# Patient Record
Sex: Female | Born: 1940
Health system: Southern US, Community
[De-identification: ages and names within clinical notes are randomized; demographics above are authoritative.]

## PROBLEM LIST (undated history)

## (undated) DIAGNOSIS — K2270 Barrett's esophagus without dysplasia: Secondary | ICD-10-CM

## (undated) DIAGNOSIS — C9240 Acute promyelocytic leukemia, not having achieved remission: Secondary | ICD-10-CM

## (undated) DIAGNOSIS — R9431 Abnormal electrocardiogram [ECG] [EKG]: Secondary | ICD-10-CM

## (undated) DIAGNOSIS — F32A Depression, unspecified: Secondary | ICD-10-CM

## (undated) DIAGNOSIS — M4800 Spinal stenosis, site unspecified: Secondary | ICD-10-CM

## (undated) DIAGNOSIS — K219 Gastro-esophageal reflux disease without esophagitis: Secondary | ICD-10-CM

## (undated) DIAGNOSIS — I4891 Unspecified atrial fibrillation: Secondary | ICD-10-CM

## (undated) DIAGNOSIS — N840 Polyp of corpus uteri: Secondary | ICD-10-CM

## (undated) DIAGNOSIS — C9200 Acute myeloblastic leukemia, not having achieved remission: Secondary | ICD-10-CM

## (undated) DIAGNOSIS — R531 Weakness: Secondary | ICD-10-CM

## (undated) DIAGNOSIS — I10 Essential (primary) hypertension: Secondary | ICD-10-CM

## (undated) DIAGNOSIS — F329 Major depressive disorder, single episode, unspecified: Secondary | ICD-10-CM

## (undated) DIAGNOSIS — F419 Anxiety disorder, unspecified: Secondary | ICD-10-CM

## (undated) DIAGNOSIS — K649 Unspecified hemorrhoids: Secondary | ICD-10-CM

## (undated) DIAGNOSIS — C519 Malignant neoplasm of vulva, unspecified: Secondary | ICD-10-CM

## (undated) HISTORY — PX: PORTACATH PLACEMENT: SHX2246

## (undated) HISTORY — DX: Anxiety disorder, unspecified: F41.9

## (undated) HISTORY — DX: Acute promyelocytic leukemia, not having achieved remission: C92.40

## (undated) HISTORY — PX: CHOLECYSTECTOMY: SHX55

## (undated) HISTORY — DX: Unspecified hemorrhoids: K64.9

## (undated) HISTORY — PX: CATARACT EXTRACTION, BILATERAL: SHX1313

## (undated) HISTORY — DX: Weakness: R53.1

## (undated) HISTORY — DX: Unspecified atrial fibrillation: I48.91

## (undated) HISTORY — PX: TUNNELED VENOUS CATHETER PLACEMENT: SHX818

## (undated) HISTORY — DX: Abnormal electrocardiogram (ECG) (EKG): R94.31

## (undated) HISTORY — PX: KNEE SURGERY: SHX244

## (undated) HISTORY — PX: HEMORROIDECTOMY: SUR656

## (undated) HISTORY — PX: OTHER SURGICAL HISTORY: SHX169

## (undated) HISTORY — DX: Spinal stenosis, site unspecified: M48.00

## (undated) HISTORY — DX: Depression, unspecified: F32.A

## (undated) HISTORY — DX: Polyp of corpus uteri: N84.0

## (undated) HISTORY — DX: Barrett's esophagus without dysplasia: K22.70

## (undated) HISTORY — DX: Malignant neoplasm of vulva, unspecified: C51.9

---
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
## 1998-03-08 ENCOUNTER — Other Ambulatory Visit: Admission: RE | Admit: 1998-03-08 | Discharge: 1998-03-08 | Payer: Self-pay | Admitting: Obstetrics and Gynecology

## 1998-10-24 ENCOUNTER — Encounter: Payer: Self-pay | Admitting: Obstetrics and Gynecology

## 1998-10-24 ENCOUNTER — Ambulatory Visit (HOSPITAL_COMMUNITY): Admission: RE | Admit: 1998-10-24 | Discharge: 1998-10-24 | Payer: Self-pay | Admitting: Obstetrics and Gynecology

## 2000-05-30 ENCOUNTER — Encounter: Admission: RE | Admit: 2000-05-30 | Discharge: 2000-05-30 | Payer: Self-pay | Admitting: Family Medicine

## 2000-05-30 ENCOUNTER — Encounter: Payer: Self-pay | Admitting: Family Medicine

## 2000-07-29 ENCOUNTER — Other Ambulatory Visit: Admission: RE | Admit: 2000-07-29 | Discharge: 2000-07-29 | Payer: Self-pay | Admitting: Obstetrics and Gynecology

## 2000-08-12 ENCOUNTER — Ambulatory Visit (HOSPITAL_COMMUNITY): Admission: RE | Admit: 2000-08-12 | Discharge: 2000-08-12 | Payer: Self-pay | Admitting: Obstetrics and Gynecology

## 2001-06-16 ENCOUNTER — Encounter: Payer: Self-pay | Admitting: Family Medicine

## 2001-06-16 ENCOUNTER — Encounter: Admission: RE | Admit: 2001-06-16 | Discharge: 2001-06-16 | Payer: Self-pay | Admitting: Family Medicine

## 2001-08-03 ENCOUNTER — Other Ambulatory Visit: Admission: RE | Admit: 2001-08-03 | Discharge: 2001-08-03 | Payer: Self-pay | Admitting: Obstetrics and Gynecology

## 2005-12-03 ENCOUNTER — Encounter: Admission: RE | Admit: 2005-12-03 | Discharge: 2005-12-03 | Payer: Self-pay | Admitting: Obstetrics and Gynecology

## 2006-12-16 ENCOUNTER — Encounter: Admission: RE | Admit: 2006-12-16 | Discharge: 2006-12-16 | Payer: Self-pay | Admitting: Obstetrics and Gynecology

## 2007-02-07 ENCOUNTER — Emergency Department (HOSPITAL_COMMUNITY): Admission: EM | Admit: 2007-02-07 | Discharge: 2007-02-07 | Payer: Self-pay | Admitting: Emergency Medicine

## 2007-07-15 ENCOUNTER — Encounter: Admission: RE | Admit: 2007-07-15 | Discharge: 2007-07-15 | Payer: Self-pay | Admitting: Gastroenterology

## 2007-12-24 ENCOUNTER — Encounter: Admission: RE | Admit: 2007-12-24 | Discharge: 2007-12-24 | Payer: Self-pay | Admitting: Obstetrics and Gynecology

## 2008-10-28 DIAGNOSIS — E7800 Pure hypercholesterolemia, unspecified: Secondary | ICD-10-CM | POA: Insufficient documentation

## 2009-01-05 ENCOUNTER — Encounter: Admission: RE | Admit: 2009-01-05 | Discharge: 2009-01-05 | Payer: Self-pay | Admitting: Internal Medicine

## 2010-01-31 ENCOUNTER — Encounter: Admission: RE | Admit: 2010-01-31 | Discharge: 2010-01-31 | Payer: Self-pay | Admitting: Obstetrics and Gynecology

## 2010-11-17 ENCOUNTER — Encounter: Payer: Self-pay | Admitting: Obstetrics and Gynecology

## 2011-01-07 ENCOUNTER — Other Ambulatory Visit: Payer: Self-pay | Admitting: Obstetrics and Gynecology

## 2011-01-07 DIAGNOSIS — Z1231 Encounter for screening mammogram for malignant neoplasm of breast: Secondary | ICD-10-CM

## 2011-02-06 ENCOUNTER — Ambulatory Visit: Payer: Self-pay

## 2012-01-28 ENCOUNTER — Other Ambulatory Visit: Payer: Self-pay | Admitting: Family Medicine

## 2012-01-28 DIAGNOSIS — Z1231 Encounter for screening mammogram for malignant neoplasm of breast: Secondary | ICD-10-CM

## 2012-02-12 ENCOUNTER — Ambulatory Visit
Admission: RE | Admit: 2012-02-12 | Discharge: 2012-02-12 | Disposition: A | Discharge status: Home / Self Care | Payer: Medicare PPO | Source: Ambulatory Visit | Attending: Family Medicine | Admitting: Family Medicine

## 2012-02-12 DIAGNOSIS — Z1231 Encounter for screening mammogram for malignant neoplasm of breast: Secondary | ICD-10-CM

## 2013-01-25 ENCOUNTER — Other Ambulatory Visit: Payer: Self-pay

## 2013-01-25 DIAGNOSIS — Z1231 Encounter for screening mammogram for malignant neoplasm of breast: Secondary | ICD-10-CM

## 2013-02-12 ENCOUNTER — Ambulatory Visit
Admission: RE | Admit: 2013-02-12 | Discharge: 2013-02-12 | Disposition: A | Discharge status: Home / Self Care | Payer: Medicare PPO | Source: Ambulatory Visit

## 2013-02-12 DIAGNOSIS — Z1231 Encounter for screening mammogram for malignant neoplasm of breast: Secondary | ICD-10-CM

## 2014-01-26 ENCOUNTER — Other Ambulatory Visit: Payer: Self-pay

## 2014-01-26 ENCOUNTER — Other Ambulatory Visit: Payer: Self-pay | Admitting: Obstetrics and Gynecology

## 2014-01-26 DIAGNOSIS — Z1231 Encounter for screening mammogram for malignant neoplasm of breast: Secondary | ICD-10-CM

## 2014-01-26 DIAGNOSIS — M8580 Other specified disorders of bone density and structure, unspecified site: Secondary | ICD-10-CM

## 2014-02-16 ENCOUNTER — Ambulatory Visit
Admission: RE | Admit: 2014-02-16 | Discharge: 2014-02-16 | Disposition: A | Discharge status: Home / Self Care | Payer: Medicare PPO | Source: Ambulatory Visit | Attending: Obstetrics and Gynecology | Admitting: Obstetrics and Gynecology

## 2014-02-16 ENCOUNTER — Ambulatory Visit
Admission: RE | Admit: 2014-02-16 | Discharge: 2014-02-16 | Disposition: A | Discharge status: Home / Self Care | Payer: Medicare PPO | Source: Ambulatory Visit

## 2014-02-16 DIAGNOSIS — Z1231 Encounter for screening mammogram for malignant neoplasm of breast: Secondary | ICD-10-CM

## 2014-02-16 DIAGNOSIS — M8580 Other specified disorders of bone density and structure, unspecified site: Secondary | ICD-10-CM

## 2015-08-09 ENCOUNTER — Other Ambulatory Visit: Payer: Self-pay | Admitting: Obstetrics and Gynecology

## 2015-08-10 ENCOUNTER — Encounter (HOSPITAL_COMMUNITY): Payer: Self-pay

## 2015-08-10 ENCOUNTER — Encounter (HOSPITAL_COMMUNITY)
Admission: RE | Admit: 2015-08-10 | Discharge: 2015-08-10 | Disposition: A | Discharge status: Home / Self Care | Payer: Medicare HMO | Source: Ambulatory Visit | Attending: Obstetrics and Gynecology | Admitting: Obstetrics and Gynecology

## 2015-08-10 DIAGNOSIS — Z01818 Encounter for other preprocedural examination: Secondary | ICD-10-CM | POA: Insufficient documentation

## 2015-08-10 HISTORY — DX: Gastro-esophageal reflux disease without esophagitis: K21.9

## 2015-08-10 HISTORY — DX: Essential (primary) hypertension: I10

## 2015-08-10 LAB — CBC WITH DIFFERENTIAL/PLATELET
BASOS ABS: 0.1 10*3/uL (ref 0.0–0.1)
Basophils Relative: 1 %
EOS ABS: 0.5 10*3/uL (ref 0.0–0.7)
Eosinophils Relative: 8 %
HCT: 42.9 % (ref 36.0–46.0)
HEMOGLOBIN: 14.5 g/dL (ref 12.0–15.0)
LYMPHS ABS: 2.2 10*3/uL (ref 0.7–4.0)
LYMPHS PCT: 35 %
MCH: 30.2 pg (ref 26.0–34.0)
MCHC: 33.8 g/dL (ref 30.0–36.0)
MCV: 89.4 fL (ref 78.0–100.0)
Monocytes Absolute: 0.7 10*3/uL (ref 0.1–1.0)
Monocytes Relative: 11 %
NEUTROS PCT: 45 %
Neutro Abs: 2.9 10*3/uL (ref 1.7–7.7)
PLATELETS: 201 10*3/uL (ref 150–400)
RBC: 4.8 MIL/uL (ref 3.87–5.11)
RDW: 15 % (ref 11.5–15.5)
WBC: 6.3 10*3/uL (ref 4.0–10.5)

## 2015-08-10 LAB — COMPREHENSIVE METABOLIC PANEL
ALK PHOS: 91 U/L (ref 38–126)
ALT: 25 U/L (ref 14–54)
AST: 24 U/L (ref 15–41)
Albumin: 4.3 g/dL (ref 3.5–5.0)
Anion gap: 7 (ref 5–15)
BUN: 16 mg/dL (ref 6–20)
CHLORIDE: 107 mmol/L (ref 101–111)
CO2: 26 mmol/L (ref 22–32)
CREATININE: 0.69 mg/dL (ref 0.44–1.00)
Calcium: 9.3 mg/dL (ref 8.9–10.3)
GFR calc non Af Amer: >60 mL/min (ref >60)
GLUCOSE: 101 mg/dL — AB (ref 65–99)
Potassium: 3.7 mmol/L (ref 3.5–5.1)
SODIUM: 140 mmol/L (ref 135–145)
Total Bilirubin: 0.8 mg/dL (ref 0.3–1.2)
Total Protein: 7.2 g/dL (ref 6.5–8.1)

## 2015-08-10 NOTE — Patient Instructions (Addendum)
   Your procedure is scheduled on: OCT 17 AT 730AM  Enter through the Main Entrance of [facility] at: [place] up the phone at the desk and dial [phone] and inform us of your arrival.  Please call this number if you have any problems the morning of surgery: [phone]  DO NOT EAT OR DRINK AFTER MIDNIGHT OCT 16 (SUNDAY)  Take these medicines the morning of surgery with a SIP OF WATER: take prilosec am of surgery  Do not wear jewelry, make-up, No metal in your hair or on your body. Do not wear lotions, powders, perfumes.  You may wear deodorant.  Do not bring valuables to the hospital. Contacts, dentures or bridgework may not be worn into surgery.  .    Patients discharged on the day of surgery will not be allowed to drive home.

## 2015-08-13 ENCOUNTER — Encounter (HOSPITAL_COMMUNITY): Payer: Self-pay | Admitting: Anesthesiology

## 2015-08-13 NOTE — Anesthesia Preprocedure Evaluation (Addendum)
Anesthesia Evaluation  Patient identified by MRN, date of birth, ID band Patient awake    Reviewed: Allergy & Precautions, H&P , NPO status , Patient's Chart, lab work & pertinent test results, reviewed documented beta blocker date and time   Airway Mallampati: I  TM Distance: >3 FB Neck ROM: full    Dental no notable dental hx. (+) Teeth Intact   Pulmonary neg pulmonary ROS,    Pulmonary exam normal        Cardiovascular hypertension, Pt. on medications Normal cardiovascular exam     Neuro/Psych negative neurological ROS  negative psych ROS   GI/Hepatic Neg liver ROS, GERD  Medicated and Controlled,  Endo/Other  negative endocrine ROS  Renal/GU negative Renal ROS     Musculoskeletal   Abdominal Normal abdominal exam  (+)   Peds  Hematology negative hematology ROS (+)   Anesthesia Other Findings   Reproductive/Obstetrics negative OB ROS                            Anesthesia Physical Anesthesia Plan  ASA: II  Anesthesia Plan: General   Post-op Pain Management:    Induction: Intravenous  Airway Management Planned: LMA  Additional Equipment:   Intra-op Plan:   Post-operative Plan:   Informed Consent: I have reviewed the patients History and Physical, chart, labs and discussed the procedure including the risks, benefits and alternatives for the proposed anesthesia with the patient or authorized representative who has indicated his/her understanding and acceptance.     Plan Discussed with: CRNA and Surgeon  Anesthesia Plan Comments:        Anesthesia Quick Evaluation

## 2015-08-13 NOTE — H&P (Signed)
[name], [name] NO.:  [id number]  MEDICAL RECORD NO.:  [id number]  LOCATION:  [facility]                         FACILITY:  [facility]  PHYSICIAN:  Lucille Passy. Ulanda Edison, M.D. DATE OF BIRTH:  May 10, 1941  DATE OF ADMISSION:  08/09/2015 DATE OF DISCHARGE:                             HISTORY & PHYSICAL   HISTORY OF PRESENT ILLNESS:  This is a 74 year old white female admitted to the hospital for D and C, hysteroscopy, removal of endometrial tissue possibly include a polyp in January 2015.  The patient reported some postmenopausal bleeding.  She was seen in the office, and her exam showed that there was a polyp on the cervix, there might have been a little blood in the cervix.  The polyp was removed from the cervix and an endometrial biopsy was done.  Path report showed the cervical polyp was benign endometrial polyp, negative for atypia or malignancy.  The endometrial biopsy showed fragments of an endometrial polyp.  There was negative for atypia or malignancy.  Separate disaggregated fragments of endometrial glands and stroma negative for hyperplasia or malignancy. The patient was informed of the results.  She was told that if she had more bleeding, she should definitely have the D and C.  Even if she did not have more bleeding, she should consider having a D and C and hysteroscopy.  Ultrasound done at that time showed what was thought to be a large polyp, also a tiny area in the right ovary that was advised to be reimaged in several months.  She did have reimaging done in May 2015 and again showed what seemed to be a polyp with the same tiny 4 x 3 mm area in the right ovary.  A followup endometrial biopsy was done in July 2016.  This did not show any true endometrial tissue in terms of intact glands and stroma, and the columnar mucosa might have represented the surface of an atrophic or weakly proliferative endometrium.  There was no atypia noted.  Again, the patient was  advised to consider having an endometrial removal with D and C and hysteroscopy.  An ultrasound done just 2 weeks ago showed thickened endometrium, but it was not clearly a polyp.  The patient had requested to have a D and C, and I wanted to see how the appearance of the endometrium was compared to the last visit.  She is admitted now for a D and C, hysteroscopy at her request to remove whatever the tissue is causing the endometrium to be thickened.  PAST MEDICAL HISTORY:  Reveals high blood pressure, high cholesterol with osteopenia, she also has GERD.  SURGICAL HISTORY:  She has had an endometrial biopsy, colonoscopy, D and C in 1960, and TNA.  ALLERGIES:  Compazine.  No latex allergy.  No other drug allergies.  SOCIAL HISTORY:  Patient never smoked.  FAMILY HISTORY:  Paternal aunt with cancer of the breast.  MEDICATIONS:  Amlodipine, atorvastatin, alprazolam.  PHYSICAL EXAMINATION:  GENERAL:  On admission, well-developed white female, in no distress. VITAL SIGNS:  Blood pressure is 124/80, pulse is 74, height 5 feet 9 inches, weight 189.4. HEAD, EYES, NOSE, AND THROAT:  Reveal a  __________. NECK:  Supple, without thyromegaly. BREASTS:  Soft without masses. LUNGS:  Clear to auscultation. HEART:  Normal size and sounds.  No murmurs. ABDOMEN:  Soft and nontender.  No masses palpable. GU:  Vulva, clitoris is hidden.  Vagina is normal.  Cervix clean, no bleeding.  Uterus normal size.  Adnexa clear.  ADMITTING IMPRESSION:  History of postmenopausal bleeding with probable endometrial polyp on endometrial biopsy and by ultrasound.  The patient is admitted for D and C, hysteroscopy.  She understands there is some risks involved, these have been explained to her and she is ready to proceed.     Lucille Passy. Ulanda Edison, M.D.     TFH/MEDQ  D:  08/13/2015  T:  08/13/2015  Job:  [id number]

## 2015-08-14 ENCOUNTER — Ambulatory Visit (HOSPITAL_COMMUNITY)
Admission: RE | Admit: 2015-08-14 | Discharge: 2015-08-14 | Disposition: A | Discharge status: Home / Self Care | Payer: Medicare HMO | Source: Ambulatory Visit | Attending: Obstetrics and Gynecology | Admitting: Obstetrics and Gynecology

## 2015-08-14 ENCOUNTER — Encounter (HOSPITAL_COMMUNITY): Payer: Self-pay | Admitting: *Deleted

## 2015-08-14 ENCOUNTER — Ambulatory Visit (HOSPITAL_COMMUNITY): Payer: Medicare HMO | Admitting: Anesthesiology

## 2015-08-14 ENCOUNTER — Encounter (HOSPITAL_COMMUNITY)
Admission: RE | Disposition: A | Discharge status: Home / Self Care | Payer: Self-pay | Source: Ambulatory Visit | Attending: Obstetrics and Gynecology

## 2015-08-14 DIAGNOSIS — Z888 Allergy status to other drugs, medicaments and biological substances status: Secondary | ICD-10-CM | POA: Insufficient documentation

## 2015-08-14 DIAGNOSIS — I10 Essential (primary) hypertension: Secondary | ICD-10-CM | POA: Insufficient documentation

## 2015-08-14 DIAGNOSIS — N840 Polyp of corpus uteri: Secondary | ICD-10-CM | POA: Diagnosis not present

## 2015-08-14 DIAGNOSIS — N950 Postmenopausal bleeding: Secondary | ICD-10-CM | POA: Insufficient documentation

## 2015-08-14 DIAGNOSIS — K219 Gastro-esophageal reflux disease without esophagitis: Secondary | ICD-10-CM | POA: Insufficient documentation

## 2015-08-14 HISTORY — PX: HYSTEROSCOPY W/D&C: SHX1775

## 2015-08-14 HISTORY — PX: CERVICAL POLYPECTOMY: SHX88

## 2015-08-14 SURGERY — DILATATION AND CURETTAGE /HYSTEROSCOPY
Anesthesia: General | Site: Vagina

## 2015-08-14 MED ORDER — ONDANSETRON HCL 4 MG/2ML IJ SOLN
INTRAMUSCULAR | Status: DC | PRN
  Administered 2015-08-14: 4 mg via INTRAVENOUS

## 2015-08-14 MED ORDER — ONDANSETRON HCL 4 MG/2ML IJ SOLN
INTRAMUSCULAR | Status: AC
  Filled 2015-08-14: qty 2

## 2015-08-14 MED ORDER — ONDANSETRON HCL 4 MG/2ML IJ SOLN: 4.0000 mg | Freq: Four times a day (QID) | INTRAMUSCULAR | Status: DC | PRN

## 2015-08-14 MED ORDER — LIDOCAINE HCL (CARDIAC) 20 MG/ML IV SOLN
INTRAVENOUS | Status: DC | PRN
  Administered 2015-08-14: 80 mg via INTRAVENOUS

## 2015-08-14 MED ORDER — MIDAZOLAM HCL 2 MG/2ML IJ SOLN
INTRAMUSCULAR | Status: DC | PRN
Start: 2015-08-14 — End: 2015-08-14
  Administered 2015-08-14: 1 mg via INTRAVENOUS

## 2015-08-14 MED ORDER — OXYCODONE HCL 5 MG/5ML PO SOLN: 5.0000 mg | Freq: Once | ORAL | Status: DC | PRN

## 2015-08-14 MED ORDER — PROPOFOL 10 MG/ML IV BOLUS
INTRAVENOUS | Status: DC | PRN
  Administered 2015-08-14: 30 mg via INTRAVENOUS
  Administered 2015-08-14: 150 mg via INTRAVENOUS

## 2015-08-14 MED ORDER — LACTATED RINGERS IV SOLN
INTRAVENOUS | Status: DC
  Administered 2015-08-14 (×2): via INTRAVENOUS

## 2015-08-14 MED ORDER — IBUPROFEN 600 MG PO TABS: 600.0000 mg | ORAL_TABLET | Freq: Four times a day (QID) | ORAL | Status: DC | PRN

## 2015-08-14 MED ORDER — PROPOFOL 10 MG/ML IV BOLUS
INTRAVENOUS | Status: AC
  Filled 2015-08-14: qty 20

## 2015-08-14 MED ORDER — MEPERIDINE HCL 25 MG/ML IJ SOLN: 6.2500 mg | INTRAMUSCULAR | Status: DC | PRN

## 2015-08-14 MED ORDER — ONDANSETRON HCL 4 MG/2ML IJ SOLN: 4.0000 mg | Freq: Once | INTRAMUSCULAR | Status: DC | PRN

## 2015-08-14 MED ORDER — FENTANYL CITRATE (PF) 100 MCG/2ML IJ SOLN: 25.0000 ug | INTRAMUSCULAR | Status: DC | PRN

## 2015-08-14 MED ORDER — ONDANSETRON HCL 4 MG PO TABS: 4.0000 mg | ORAL_TABLET | Freq: Four times a day (QID) | ORAL | Status: DC | PRN

## 2015-08-14 MED ORDER — OXYCODONE HCL 5 MG PO TABS: 5.0000 mg | ORAL_TABLET | Freq: Once | ORAL | Status: DC | PRN

## 2015-08-14 MED ORDER — FENTANYL CITRATE (PF) 100 MCG/2ML IJ SOLN
INTRAMUSCULAR | Status: DC | PRN
  Administered 2015-08-14 (×2): 50 ug via INTRAVENOUS

## 2015-08-14 MED ORDER — LIDOCAINE HCL (CARDIAC) 20 MG/ML IV SOLN
INTRAVENOUS | Status: AC
  Filled 2015-08-14: qty 5

## 2015-08-14 MED ORDER — DEXAMETHASONE SODIUM PHOSPHATE 10 MG/ML IJ SOLN
INTRAMUSCULAR | Status: AC
Start: 2015-08-14 — End: 2015-08-14
  Filled 2015-08-14: qty 1

## 2015-08-14 MED ORDER — FENTANYL CITRATE (PF) 100 MCG/2ML IJ SOLN
INTRAMUSCULAR | Status: AC
  Filled 2015-08-14: qty 4

## 2015-08-14 MED ORDER — MIDAZOLAM HCL 2 MG/2ML IJ SOLN
INTRAMUSCULAR | Status: AC
  Filled 2015-08-14: qty 4

## 2015-08-14 MED ORDER — DEXAMETHASONE SODIUM PHOSPHATE 4 MG/ML IJ SOLN
INTRAMUSCULAR | Status: DC | PRN
  Administered 2015-08-14: 4 mg via INTRAVENOUS

## 2015-08-14 SURGICAL SUPPLY — 14 items
CANISTER SUCT 3000ML (MISCELLANEOUS) ×3 IMPLANT
CATH ROBINSON RED A/P 16FR (CATHETERS) ×3 IMPLANT
CLOTH BEACON ORANGE TIMEOUT ST (SAFETY) ×3 IMPLANT
CONTAINER PREFILL 10% NBF 60ML (FORM) ×6 IMPLANT
ELECT REM PT RETURN 9FT ADLT (ELECTROSURGICAL)
ELECTRODE REM PT RTRN 9FT ADLT (ELECTROSURGICAL) IMPLANT
GLOVE BIO SURGEON STRL SZ7.5 (GLOVE) ×3 IMPLANT
GOWN STRL REUS W/TWL LRG LVL3 (GOWN DISPOSABLE) ×6 IMPLANT
PACK VAGINAL MINOR WOMEN LF (CUSTOM PROCEDURE TRAY) ×3 IMPLANT
PAD OB MATERNITY 4.3X12.25 (PERSONAL CARE ITEMS) ×3 IMPLANT
TOWEL OR 17X24 6PK STRL BLUE (TOWEL DISPOSABLE) ×6 IMPLANT
TUBING AQUILEX INFLOW (TUBING) ×3 IMPLANT
TUBING AQUILEX OUTFLOW (TUBING) ×3 IMPLANT
WATER STERILE IRR 1000ML POUR (IV SOLUTION) ×3 IMPLANT

## 2015-08-14 NOTE — Anesthesia Postprocedure Evaluation (Signed)
Anesthesia Post Note  Patient: [name]  Procedure(s) Performed: Procedure(s) (LRB): DILATATION AND CURETTAGE /HYSTEROSCOPY (N/A) CERVICAL POLYPECTOMY (N/A)  Anesthesia type: General  Patient location: PACU  Post pain: Pain level controlled  Post assessment: Post-op Vital signs reviewed  Last Vitals:  Filed Vitals:   08/14/15 0830  BP: 132/65  Pulse: 66  Temp:   Resp: 11    Post vital signs: Reviewed  Level of consciousness: sedated  Complications: No apparent anesthesia complications

## 2015-08-14 NOTE — Transfer of Care (Signed)
Immediate Anesthesia Transfer of Care Note  Patient: [name]  Procedure(s) Performed: Procedure(s): DILATATION AND CURETTAGE /HYSTEROSCOPY (N/A) CERVICAL POLYPECTOMY (N/A)  Patient Location: PACU  Anesthesia Type:General  Level of Consciousness: awake, alert  and oriented  Airway & Oxygen Therapy: Patient Spontanous Breathing and Patient connected to nasal cannula oxygen  Post-op Assessment: Report given to RN and Post -op Vital signs reviewed and stable  Post vital signs: Reviewed and stable  Last Vitals:  Filed Vitals:   08/14/15 0604  BP: 149/88  Pulse: 70  Temp: 36.4 C  Resp: 18    Complications: No apparent anesthesia complications

## 2015-08-14 NOTE — Discharge Instructions (Signed)
Call with heavy bleeding, temp> 100.4 degrees, severe pain or any problems. DISCHARGE INSTRUCTIONS: D&C / D&E The following instructions have been prepared to help you care for yourself upon your return home.   Personal hygiene:  Use sanitary pads for vaginal drainage, not tampons.  Shower the day after your procedure.  NO tub baths, pools or Jacuzzis for 2-3 weeks.  Wipe front to back after using the bathroom.  Activity and limitations:  Do NOT drive or operate any equipment for 24 hours. The effects of anesthesia are still present and drowsiness may result.  Do NOT rest in bed all day.  Walking is encouraged.  Walk up and down stairs slowly.  You may resume your normal activity in one to two days or as indicated by your physician.  Sexual activity: NO intercourse for at least 2 weeks after the procedure, or as indicated by your physician.  Diet: Eat a light meal as desired this evening. You may resume your usual diet tomorrow.  Return to work: You may resume your work activities in one to two days or as indicated by your doctor.  What to expect after your surgery: Expect to have vaginal bleeding/discharge for 2-3 days and spotting for up to 10 days. It is not unusual to have soreness for up to 1-2 weeks. You may have a slight burning sensation when you urinate for the first day. Mild cramps may continue for a couple of days. You may have a regular period in 2-6 weeks.  Call your doctor for any of the following:  Excessive vaginal bleeding, saturating and changing one pad every hour.  Inability to urinate 6 hours after discharge from hospital.  Pain not relieved by pain medication.  Fever of 100.4 F or greater.  Unusual vaginal discharge or odor.   Call for an appointment:    Patients signature: ______________________  Nurses signature ________________________  Support person's signature_______________________    Post Anesthesia Home Care  Instructions  Activity: Get plenty of rest for the remainder of the day. A responsible adult should stay with you for 24 hours following the procedure.  For the next 24 hours, DO NOT: -Drive a car -Paediatric nurse -Drink alcoholic beverages -Take any medication unless instructed by your physician -Make any legal decisions or sign important papers.  Meals: Start with liquid foods such as gelatin or soup. Progress to regular foods as tolerated. Avoid greasy, spicy, heavy foods. If nausea and/or vomiting occur, drink only clear liquids until the nausea and/or vomiting subsides. Call your physician if vomiting continues.  Special Instructions/Symptoms: Your throat may feel dry or sore from the anesthesia or the breathing tube placed in your throat during surgery. If this causes discomfort, gargle with warm salt water. The discomfort should disappear within 24 hours.  If you had a scopolamine patch placed behind your ear for the management of post- operative nausea and/or vomiting:  1. The medication in the patch is effective for 72 hours, after which it should be removed.  Wrap patch in a tissue and discard in the trash. Wash hands thoroughly with soap and water. 2. You may remove the patch earlier than 72 hours if you experience unpleasant side effects which may include dry mouth, dizziness or visual disturbances. 3. Avoid touching the patch. Wash your hands with soap and water after contact with the patch.

## 2015-08-14 NOTE — Op Note (Signed)
NAME:  NOVAH, NESSEL NO.:  [id number]  MEDICAL RECORD NO.:  [id number]  LOCATION:  [facility]                          FACILITY:  [facility]  PHYSICIAN:  Lucille Passy. Ulanda Edison, M.D. DATE OF BIRTH:  09/14/1941  DATE OF PROCEDURE:  08/14/2015 DATE OF DISCHARGE:                              OPERATIVE REPORT   PREOPERATIVE DIAGNOSIS:  Probable endometrial polyp, postmenopausal bleeding.  POSTOPERATIVE DIAGNOSIS:  Probable endometrial polyp, postmenopausal bleeding.  OPERATION:  Fractional D and C, removal of large endometrial polyp.  OPERATOR:  Lucille Passy. Ulanda Edison, MD  ANESTHESIA:  MAC anesthesia by Charlett Lango, MD and Ms. Fussell.  PROCEDURE IN DETAIL:  The patient was brought to the operating room, placed under general anesthesia with an oral airway placed in the [place] in lithotomy position.  Exam revealed the uterus to be slightly posterior, normal size.  Adnexa were free of masses.  The vulva, vagina, and urethra were prepped with Betadine solution.  Bladder was emptied with a [place] catheter after prepping the urethra.  A Graves speculum was used to visualize the cervix.  The cervix was drawn into the operative field with a tenaculum on the anterior cervix.  Uterus sounded little over 3 inches slight posteriorly.  Cervical canal was dilated gently so that we would easily admit the hysteroscope.  I inserted the hysteroscope and there was an endometrial polyp arising from the posterior aspect of the fundus coming down all the way to the cervix.  I removed the hysteroscope and did an endocervical curettage and then I did a removal of the endometrial polyp.  There were multiple bites required, but finally the last removal was a large 2 cm x 1 cm polyp.  I looked again with the hysteroscope, there was a little bit of polypoid tissue remaining on the right lateral uterine wall.  I removed this, did a D and C of the remaining endometrium, looked again with  the hysteroscope.  The cavity was completely clean.  The procedure was terminated after I used pressure to control the bleeding from the tenaculum site.  Patient seemed to tolerate the procedure well.  Blood loss was less than 10 mL.  Sponge and needle counts correct.  The patient was returned to recovery in satisfactory condition.     Lucille Passy. Ulanda Edison, M.D.     TFH/MEDQ  D:  08/14/2015  T:  08/14/2015  Job:  [id number]

## 2015-08-14 NOTE — Progress Notes (Signed)
Patient ID: [name], female   DOB: 05-08-1941, 74 y.o.   MRN: [id number] I examined this lady within the last week and she reports no change in her health since that time.

## 2015-08-14 NOTE — Anesthesia Procedure Notes (Signed)
Procedure Name: LMA Insertion Date/Time: 08/14/2015 7:33 AM Performed by: Flossie Dibble Pre-anesthesia Checklist: Patient identified, Timeout performed, Emergency Drugs available, Suction available and Patient being monitored Patient Re-evaluated:Patient Re-evaluated prior to inductionOxygen Delivery Method: Circle system utilized Preoxygenation: Pre-oxygenation with 100% oxygen Intubation Type: IV induction LMA: LMA inserted LMA Size: 4.0 Number of attempts: 1 Placement Confirmation: positive ETCO2 and breath sounds checked- equal and bilateral Tube secured with: Tape Dental Injury: Teeth and Oropharynx as per pre-operative assessment

## 2015-08-15 ENCOUNTER — Encounter (HOSPITAL_COMMUNITY): Payer: Self-pay | Admitting: Obstetrics and Gynecology

## 2016-01-08 DIAGNOSIS — G8929 Other chronic pain: Secondary | ICD-10-CM | POA: Insufficient documentation

## 2016-01-23 ENCOUNTER — Ambulatory Visit (INDEPENDENT_AMBULATORY_CARE_PROVIDER_SITE_OTHER): Payer: Medicare HMO | Admitting: Cardiology

## 2016-01-23 ENCOUNTER — Encounter: Payer: Self-pay | Admitting: Cardiology

## 2016-01-23 VITALS — BP 142/80 | HR 77 | Ht 69.0 in | Wt 193.4 lb

## 2016-01-23 DIAGNOSIS — I10 Essential (primary) hypertension: Secondary | ICD-10-CM | POA: Diagnosis not present

## 2016-01-23 DIAGNOSIS — R531 Weakness: Secondary | ICD-10-CM

## 2016-01-23 DIAGNOSIS — R9431 Abnormal electrocardiogram [ECG] [EKG]: Secondary | ICD-10-CM

## 2016-01-23 HISTORY — DX: Abnormal electrocardiogram (ECG) (EKG): R94.31

## 2016-01-23 NOTE — Progress Notes (Signed)
Cardiology Office Note   Date:  01/23/2016   ID:  Aniyjah, Laurance 1941-07-04, MRN [id number]  PCP:  Janie Morning, DO    Chief Complaint  Patient presents with  . Fatigue      History of Present Illness: [name] is a 75 y.o. female who presents for evaluation of weakness, dizziness, near syncope and leg pain.  She has had leg weakness and states that her legs don't want to hold up.  She has jerking all over.  She will then feel like she is going to pass out.  She cannot walk far due to weakness. She was seen by Neurology and was diagnosed with spinal stenosis.  She denies any chest pain or pressure, LE edema or palpitations.  She occasionally has some DOE when cleaning house or when working in the yard.  She says that she has never passed out.  Typically she will suddenly feel like she is going to pass out and she will fall down but not lose consciousness.  She says she is not dizzy, she just gets very weak.  She denies any prodrome of nausea, diaphoresis or palpitations.  She says that she will get very anxious and has taken xanax when this happens which resolves her symptoms.  She can be sitting or standing when these episodes occur.      Past Medical History  Diagnosis Date  . GERD (gastroesophageal reflux disease)   . Hypertension   . Vaginal delivery 1964  . Weakness     Past Surgical History  Procedure Laterality Date  . [place]      89  . Portacath placement    . Hemorroidectomy    . Hysteroscopy w/d&c N/A 08/14/2015    Procedure: DILATATION AND CURETTAGE /HYSTEROSCOPY;  Surgeon: Newton Pigg, MD;  Location: [facility];  Service: Gynecology;  Laterality: N/A;  . Cervical polypectomy N/A 08/14/2015    Procedure: CERVICAL POLYPECTOMY;  Surgeon: Newton Pigg, MD;  Location: [facility];  Service: Gynecology;  Laterality: N/A;     Current Outpatient Prescriptions  Medication Sig Dispense Refill  . ALPRAZolam (XANAX) 0.5 MG tablet Take 0.5 mg by  mouth daily as needed for anxiety.    Marland Kitchen amLODipine (NORVASC) 5 MG tablet Take 5 mg by mouth daily.    Marland Kitchen omeprazole (PRILOSEC) 40 MG capsule Take 40 mg by mouth daily.     No current facility-administered medications for this visit.    Allergies:   Compazine    Social History:  The patient  reports that she has never smoked. She has never used smokeless tobacco. She reports that she does not drink alcohol or use illicit drugs.   Family History:  The patient's family history includes Alzheimer's disease in her brother; CVA in her mother; Heart attack in her brother and mother; Heart disease in her brother and mother; Lymphoma in her father.    ROS:  Please see the history of present illness.   Otherwise, review of systems are positive for none.   All other systems are reviewed and negative.    PHYSICAL EXAM: VS:  BP 142/80 mmHg  Pulse 77  Ht 5\' 9"  (1.753 m)  Wt 193 lb 6.4 oz (87.726 kg)  BMI 28.55 kg/m2 , BMI Body mass index is 28.55 kg/(m^2). GEN: Well nourished, well developed, in no acute distress HEENT: normal Neck: no JVD, carotid bruits, or masses Cardiac:  RRR; no murmurs, rubs, or gallops,no edema  Respiratory:  clear to auscultation bilaterally, normal work of breathing GI: soft, nontender, nondistended, + BS MS: no deformity or atrophy Skin: warm and dry, no rash Neuro:  Strength and sensation are intact Psych: euthymic mood, full affect   EKG:  EKG is ordered today. The ekg ordered today demonstrates NSR at 77bpm with LVH and no ST changes and cannot rule out anterior infarct   Recent Labs: 08/10/2015: ALT 25; BUN 16; Creatinine, Ser 0.69; Hemoglobin 14.5; Platelets 201; Potassium 3.7; Sodium 140    Lipid Panel No results found for: CHOL, TRIG, HDL, CHOLHDL, VLDL, LDLCALC, LDLDIRECT    Wt Readings from Last 3 Encounters:  01/23/16 193 lb 6.4 oz (87.726 kg)  08/10/15 189 lb (85.73 kg)       ASSESSMENT AND PLAN:  1.  Weakness of unclear etiology.  EKG is  nonischemic but ? Old anterior infarct.  There may be an anxiety component to it.  Her CRF include postmenopausal state and HTN.  She has never smoked and no family history of CAD.  I will get a 2D echo to assess LVF and Lexiscan myoview to rule out ischemia.  I will also get a 30 day event monitor to assess for arrhythmias. 2.  HTN - controlled on amlodipine.   3.  Abnormal EKG with ? Old anterior infarct.  Will get echo and nuclear stress test. 4.  Leg weakness - I suspect this is due to spinal stenosis   Current medicines are reviewed at length with the patient today.  The patient does not have concerns regarding medicines.  The following changes have been made:  no change  Labs/ tests ordered today: See above Assessment and Plan No orders of the defined types were placed in this encounter.     Disposition:   FU with me PRN pending results of studies   Signed, Sueanne Margarita, MD  01/23/2016 3:47 PM    [facility] [place], [place], [place]  [address] Phone: [phone]; Fax: [phone]

## 2016-01-23 NOTE — Patient Instructions (Signed)
Medication Instructions:  Your physician recommends that you continue on your current medications as directed. Please refer to the Current Medication list given to you today.   Labwork: None  Testing/Procedures: Your physician has requested that you have an echocardiogram. Echocardiography is a painless test that uses sound waves to create images of your heart. It provides your doctor with information about the size and shape of your heart and how well your heart's chambers and valves are working. This procedure takes approximately one hour. There are no restrictions for this procedure.   Your physician has requested that you have a lexiscan myoview. For further information please visit [email or web address]. Please follow instruction sheet, as given.   Your physician has recommended that you wear an event monitor. Event monitors are medical devices that record the heart's electrical activity. Doctors most often [place] these monitors to diagnose arrhythmias. Arrhythmias are problems with the speed or rhythm of the heartbeat. The monitor is a small, portable device. You can wear one while you do your normal daily activities. This is usually used to diagnose what is causing palpitations/syncope (passing out).  Follow-Up: Your physician recommends that you schedule a follow-up appointment AS NEEDED with Dr. Radford Pax pending study results.  Any Other Special Instructions Will Be Listed Below (If Applicable).     If you need a refill on your cardiac medications before your next appointment, please call your pharmacy.

## 2016-01-24 ENCOUNTER — Other Ambulatory Visit: Payer: Self-pay | Admitting: Cardiology

## 2016-01-24 DIAGNOSIS — R55 Syncope and collapse: Secondary | ICD-10-CM

## 2016-01-24 DIAGNOSIS — R531 Weakness: Secondary | ICD-10-CM

## 2016-01-25 ENCOUNTER — Ambulatory Visit (INDEPENDENT_AMBULATORY_CARE_PROVIDER_SITE_OTHER): Payer: Medicare HMO

## 2016-01-25 DIAGNOSIS — R531 Weakness: Secondary | ICD-10-CM | POA: Diagnosis not present

## 2016-01-25 DIAGNOSIS — R55 Syncope and collapse: Secondary | ICD-10-CM

## 2016-01-26 ENCOUNTER — Telehealth: Payer: Self-pay | Admitting: Cardiology

## 2016-01-26 NOTE — Telephone Encounter (Signed)
LMTCB

## 2016-01-26 NOTE — Telephone Encounter (Signed)
Daughter called back but do not have a DPR to speak with her.  Called pt.  She wants to know if would be OK to continue therapy on her legs, exercising, while wearing the 30 day monitor.  Advised the exercises should not interfere with the monitor.  Advised for her to call the therapist to see if they felt like there would be any interference.  She verbalizes understanding.

## 2016-01-26 NOTE — Telephone Encounter (Signed)
NEw Message  Pt calling to speak w/ RN- wanted to know if pt can do therapy for stenosis of her back. Please call back and discuss.

## 2016-02-06 ENCOUNTER — Telehealth (HOSPITAL_COMMUNITY): Payer: Self-pay | Admitting: *Deleted

## 2016-02-06 NOTE — Telephone Encounter (Signed)
Left message on voicemail in reference to upcoming appointment scheduled for 02/08/16. Phone number given for a call back so details instructions can be given. Keldrick Pomplun, Ranae Palms

## 2016-02-06 NOTE — Telephone Encounter (Signed)
Patient given detailed instructions per Myocardial Perfusion Study Information Sheet for the test on 02/08/16 at 9:45. Patient notified to arrive 15 minutes early and that it is imperative to arrive on time for appointment to keep from having the test rescheduled.  If you need to cancel or reschedule your appointment, please call the office within 24 hours of your appointment. Failure to do so may result in a cancellation of your appointment, and a $50 no show fee. Patient verbalized understanding.[name]

## 2016-02-08 ENCOUNTER — Other Ambulatory Visit: Payer: Self-pay

## 2016-02-08 ENCOUNTER — Ambulatory Visit (HOSPITAL_BASED_OUTPATIENT_CLINIC_OR_DEPARTMENT_OTHER): Payer: Medicare HMO

## 2016-02-08 ENCOUNTER — Ambulatory Visit (HOSPITAL_COMMUNITY): Payer: Medicare HMO | Attending: Cardiovascular Disease

## 2016-02-08 DIAGNOSIS — R531 Weakness: Secondary | ICD-10-CM | POA: Insufficient documentation

## 2016-02-08 DIAGNOSIS — Z8249 Family history of ischemic heart disease and other diseases of the circulatory system: Secondary | ICD-10-CM | POA: Diagnosis not present

## 2016-02-08 DIAGNOSIS — R9431 Abnormal electrocardiogram [ECG] [EKG]: Secondary | ICD-10-CM

## 2016-02-08 DIAGNOSIS — R0609 Other forms of dyspnea: Secondary | ICD-10-CM | POA: Diagnosis not present

## 2016-02-08 DIAGNOSIS — C9590 Leukemia, unspecified not having achieved remission: Secondary | ICD-10-CM | POA: Insufficient documentation

## 2016-02-08 DIAGNOSIS — R42 Dizziness and giddiness: Secondary | ICD-10-CM | POA: Insufficient documentation

## 2016-02-08 DIAGNOSIS — I10 Essential (primary) hypertension: Secondary | ICD-10-CM | POA: Insufficient documentation

## 2016-02-08 LAB — MYOCARDIAL PERFUSION IMAGING
CHL CUP NUCLEAR SRS: 8
CHL CUP NUCLEAR SSS: 7
LHR: 0.38
LV sys vol: 26 mL
LVDIAVOL: 68 mL (ref 46–106)
NUC STRESS TID: 1.07
Peak HR: 101 {beats}/min
Rest HR: 65 {beats}/min
SDS: 2

## 2016-02-08 MED ORDER — TECHNETIUM TC 99M SESTAMIBI GENERIC - CARDIOLITE
10.7000 | Freq: Once | INTRAVENOUS | Status: AC | PRN
  Administered 2016-02-08: 11 via INTRAVENOUS

## 2016-02-08 MED ORDER — REGADENOSON 0.4 MG/5ML IV SOLN
0.4000 mg | Freq: Once | INTRAVENOUS | Status: AC
  Administered 2016-02-08: 0.4 mg via INTRAVENOUS

## 2016-02-08 MED ORDER — TECHNETIUM TC 99M SESTAMIBI GENERIC - CARDIOLITE
32.6000 | Freq: Once | INTRAVENOUS | Status: AC | PRN
  Administered 2016-02-08: 33 via INTRAVENOUS

## 2016-03-05 ENCOUNTER — Telehealth: Payer: Self-pay

## 2016-03-05 MED ORDER — METOPROLOL SUCCINATE ER 25 MG PO TB24: 25.0000 mg | ORAL_TABLET | Freq: Every day | ORAL | Status: DC

## 2016-03-05 NOTE — Telephone Encounter (Signed)
-----   Message from Sueanne Margarita, MD sent at 03/03/2016  6:22 PM EDT ----- Heart monitor showed NSR with occasional PACs and nonsustained atrial tachycardia up to 13 beats. Please add Toprol XL 25mg  daily and followup with APP in 3 weeks to see how she is doing - check TSH and BMET

## 2016-03-05 NOTE — Telephone Encounter (Signed)
Informed patient of results and verbal understanding expressed.  Instructed patient to START TOPROL 25 mg daily. Follow-up OV scheduled with Jentry Kristoff 5/31 at 0830. Patient lives in [place]. Will send Rx for TSH and BMET to be drawn at PCP. Patient agrees with treatment plan.

## 2016-03-21 ENCOUNTER — Encounter: Payer: Self-pay | Admitting: Cardiology

## 2016-03-26 NOTE — Progress Notes (Signed)
Cardiology Office Note   Date:  03/27/2016   ID:  [name], [name] 1941-09-30, MRN [id number]  PCP:  Janie Morning, DO  Cardiologist:  Dr. Radford Pax    Chief Complaint  Patient presents with  . Tachycardia    follow up on toprol      History of Present Illness: [name] is a 75 y.o. female who presents for follow up of monitor, Echo and nuc study.   She has been evaluated for weakness, dizziness, near syncope and leg pain. Seen by Dr. Radford Pax 01/23/16.  She has had leg weakness and states that her legs don't want to hold up. She has jerking all over. She will then feel like she is going to pass out. She cannot walk far due to weakness. She was seen by Neurology and was diagnosed with spinal stenosis. She denies any chest pain or pressure, LE edema or palpitations. She occasionally has some DOE when cleaning house or when working in the yard. She says that she has never passed out. Typically she will suddenly feel like she is going to pass out and she will fall down but not lose consciousness. She says she is not dizzy, she just gets very weak. She denies any prodrome of nausea, diaphoresis or palpitations. She says that she will get very anxious and has taken xanax when this happens which resolves her symptoms. She can be sitting or standing when these episodes occur.   Nuc study reveled no ischemia and echo was normal except mildly stiff ventricle. Her monitor revealed SR with PACs and nonsustained atrial tachy t 13 beats  She was placed on torol 25 mg daily.  Today is doing well, feels better.  No rapid HR.  We reviewed results of her tests.  insturcted no to very little caffeine and no pseudoephedrine.    Past Medical History  Diagnosis Date  . GERD (gastroesophageal reflux disease)   . Hypertension   . Vaginal delivery 1964  . Weakness   . Abnormal EKG 01/23/2016    Past Surgical History  Procedure Laterality Date  . [place]      91  . Portacath placement     . Hemorroidectomy    . Hysteroscopy w/d&c N/A 08/14/2015    Procedure: DILATATION AND CURETTAGE /HYSTEROSCOPY;  Surgeon: Newton Pigg, MD;  Location: [facility];  Service: Gynecology;  Laterality: N/A;  . Cervical polypectomy N/A 08/14/2015    Procedure: CERVICAL POLYPECTOMY;  Surgeon: Newton Pigg, MD;  Location: [facility];  Service: Gynecology;  Laterality: N/A;     Current Outpatient Prescriptions  Medication Sig Dispense Refill  . amLODipine (NORVASC) 5 MG tablet Take 5 mg by mouth daily.    . diazepam (VALIUM) 10 MG tablet Take 10 mg by mouth 2 (two) times daily.    . metoprolol succinate (TOPROL-XL) 25 MG 24 hr tablet Take 1 tablet (25 mg total) by mouth daily. 30 tablet 11   No current facility-administered medications for this visit.    Allergies:   Compazine and Prochlorperazine    Social History:  The patient  reports that she has never smoked. She has never used smokeless tobacco. She reports that she does not drink alcohol or use illicit drugs.   Family History:  The patient's family history includes Alzheimer's disease in her brother; CVA in her mother; Heart attack in her brother and mother; Heart disease in her brother and mother; Lymphoma in her father.    ROS:  General:no colds or fevers,  no weight changes CV:see HPI PUL:see HPI MS:no joint pain, no claudication, + leg weakness due to spinal stenosis, just completed PT. Neuro:no syncope, no lightheadedness  Wt Readings from Last 3 Encounters:  03/27/16 192 lb 12.8 oz (87.454 kg)  02/08/16 193 lb (87.544 kg)  01/23/16 193 lb 6.4 oz (87.726 kg)     PHYSICAL EXAM: VS:  BP 120/80 mmHg  Pulse 73  Ht 5\' 9"  (1.753 m)  Wt 192 lb 12.8 oz (87.454 kg)  BMI 28.46 kg/m2  SpO2 91% , BMI Body mass index is 28.46 kg/(m^2). General:Pleasant affect, NAD Skin:Warm and dry, brisk capillary refill HEENT:normocephalic, sclera clear, mucus membranes moist Heart:S1S2 RRR without murmur, gallup, rub or click Lungs:clear without  rales, rhonchi, or wheezes Neuro:alert and oriented, MAE, follows commands, + facial symmetry    EKG:  EKG is not ordered today.    Recent Labs: 08/10/2015: ALT 25; BUN 16; Creatinine, Ser 0.69; Hemoglobin 14.5; Platelets 201; Potassium 3.7; Sodium 140    Lipid Panel No results found for: CHOL, TRIG, HDL, CHOLHDL, VLDL, LDLCALC, LDLDIRECT     Other studies Reviewed: Additional studies/ records that were reviewed today include:  Heart Monitor : Heart monitor showed NSR with occasional PACs and nonsustained atrial tachycardia up to 13 beats    ECHO 02/08/16 Study Conclusions  - Left ventricle: The cavity size was normal. Wall thickness was  normal. Systolic function was normal. The estimated ejection  fraction was in the range of 50% to 55%. Doppler parameters are  consistent with abnormal left ventricular relaxation (grade 1  diastolic dysfunction). Indeterminate ventricular filling  pressure. - Aortic valve: Mildly thickened leaflets.  Nuclear stress test: 02/08/16 Study Highlights     Nuclear stress EF: 62%.  There was no ST segment deviation noted during stress.  The study is normal. no ecvidence of ischemia.  This is a low risk study     ASSESSMENT AND PLAN:  1.  Atrial tach on monitor we added toprol and pt feels better.  Continue the toprol and follow up with Dr. Radford Pax in 6 months though if problems she may call to be seen.   2. Weakness of unclear etiology.this has improved.  EKG is nonischemic but ? Old anterior infarct. There may be an anxiety component to it. Her CRF include postmenopausal state and HTN. She has never smoked and no family history of CAD.her Nuc study was negative for ischemia and her Echo had mild LV  Stiffness.   3.. HTN - controlled on amlodipine and now toprol.   3. Abnormal EKG with ? Old anterior infarct. see above  4. Leg weakness -  due to spinal stenosis, she has completed PT and is doing better.     Current medicines are reviewed with the patient today.  The patient Has no concerns regarding medicines.  The following changes have been made:  See above Labs/ tests ordered today include:see above  Disposition:   FU:  see above  Signed, Cecilie Kicks, NP  03/27/2016 8:45 AM    [address], [place], [place], [place] Phone: [phone]; Fax: [phone]

## 2016-03-27 ENCOUNTER — Telehealth: Payer: Self-pay | Admitting: Cardiology

## 2016-03-27 ENCOUNTER — Ambulatory Visit (INDEPENDENT_AMBULATORY_CARE_PROVIDER_SITE_OTHER): Payer: Medicare HMO | Admitting: Cardiology

## 2016-03-27 ENCOUNTER — Encounter: Payer: Self-pay | Admitting: Cardiology

## 2016-03-27 VITALS — BP 120/80 | HR 73 | Ht 69.0 in | Wt 192.8 lb

## 2016-03-27 DIAGNOSIS — R9431 Abnormal electrocardiogram [ECG] [EKG]: Secondary | ICD-10-CM | POA: Diagnosis not present

## 2016-03-27 DIAGNOSIS — I471 Supraventricular tachycardia: Secondary | ICD-10-CM | POA: Diagnosis not present

## 2016-03-27 DIAGNOSIS — I10 Essential (primary) hypertension: Secondary | ICD-10-CM | POA: Diagnosis not present

## 2016-03-27 DIAGNOSIS — M4800 Spinal stenosis, site unspecified: Secondary | ICD-10-CM | POA: Diagnosis not present

## 2016-03-27 MED ORDER — METOPROLOL SUCCINATE ER 25 MG PO TB24: 25.0000 mg | ORAL_TABLET | Freq: Every day | ORAL | Status: DC

## 2016-03-27 NOTE — Telephone Encounter (Signed)
Patient st she had her lab work drawn at her PCP's office.  Labs are scanned in. Informed patient that labs look good, but will be routed to Dr. Radford Pax for review. She understands she will be called if Dr. Radford Pax has further recommendations.

## 2016-03-27 NOTE — Patient Instructions (Signed)
Medication Instructions:  1. A REFILL FOR TOPROL HAS BEEN SENT IN  Labwork: NONE  Testing/Procedures: NONE  Follow-Up: Your physician wants you to follow-up in: 6 MONTHS WITH DR. Mallie Snooks will receive a reminder letter in the mail two months in advance. If you don't receive a letter, please call our office to schedule the follow-up appointment.   Any Other Special Instructions Will Be Listed Below (If Applicable).     If you need a refill on your cardiac medications before your next appointment, please call your pharmacy.

## 2016-03-27 NOTE — Telephone Encounter (Signed)
[name] is calling to get her lab results . Please call .Marland Kitchen Thanks

## 2016-03-28 ENCOUNTER — Telehealth: Payer: Self-pay | Admitting: Cardiology

## 2016-03-28 NOTE — Telephone Encounter (Signed)
Informed patient there is no documentation of anyone calling her. Apologized for inconvenience and informed patient that whoever the caller was would call back if needed. She was grateful for call.

## 2016-03-28 NOTE — Telephone Encounter (Signed)
New message    Patient verbalized someone called her just now .When looking at the call ID - [facility] office # came up.     Inform the patient of 5.31.2017 messages from Dr. Radford Pax nurse .

## 2016-04-22 ENCOUNTER — Other Ambulatory Visit: Payer: Self-pay | Admitting: Obstetrics and Gynecology

## 2016-04-22 DIAGNOSIS — Z1231 Encounter for screening mammogram for malignant neoplasm of breast: Secondary | ICD-10-CM

## 2016-05-06 ENCOUNTER — Ambulatory Visit
Admission: RE | Admit: 2016-05-06 | Discharge: 2016-05-06 | Disposition: A | Discharge status: Home / Self Care | Payer: Medicare HMO | Source: Ambulatory Visit | Attending: Obstetrics and Gynecology | Admitting: Obstetrics and Gynecology

## 2016-05-06 ENCOUNTER — Other Ambulatory Visit: Payer: Self-pay | Admitting: Obstetrics and Gynecology

## 2016-05-06 DIAGNOSIS — Z1231 Encounter for screening mammogram for malignant neoplasm of breast: Secondary | ICD-10-CM

## 2016-06-11 DIAGNOSIS — H2510 Age-related nuclear cataract, unspecified eye: Secondary | ICD-10-CM | POA: Insufficient documentation

## 2017-02-24 ENCOUNTER — Encounter: Payer: Self-pay | Admitting: Neurology

## 2017-03-14 NOTE — Progress Notes (Signed)
[name] was seen today in the movement disorders clinic for neurologic consultation at the request of Dr. Vertell Limber.  Pts PCP is Janie Morning, DO.  The consultation is for the evaluation of R hand tremor.  Pt has seen other neurologists including Dr. Baltazar Najjar and Dr. Metta Clines.  The records that were made available to me were reviewed.  She last saw Dr. Baltazar Najjar in June, 2017.  She was being assessed for weakness and is worked up for myasthenia gravis.  Acetylcholine receptor antibodies were apparently negative.  She had a tremor at that time that was attributed to anxiety.   Tremor: Yes.    How long has it been going on? 2 years ("it worries me and I want to cry") At rest or with activation?  With activation/with writing/eating Fam hx of tremor?  No. Located where?  R hand more than L but she is R hand dominant Affected by caffeine:  Unknown (doesn't drink) Affected by alcohol:  Unknown (doesn't drink) Affected by stress:  Yes.   ("but I will take valium and that will calm it") Affected by fatigue:  No. Spills soup if on spoon:  No. Spills glass of liquid if full:  No. Affects ADL's (tying shoes, brushing teeth, etc):  No.   Specific Symptoms:  Voice: no change, except deeper voice Sleep: may or may not sleep well  Vivid Dreams:  No., but dreams every night  Acting out dreams:  No. Postural symptoms:  Yes.    Falls?  Yes.  , because "legs are weak", perhaps from spinal stenosis Bradykinesia symptoms: difficulty getting out of a chair and difficulty regaining balance Loss of smell:  No. Loss of taste:  No. Urinary Incontinence:  No. Difficulty Swallowing:  No. Handwriting, micrographia: Yes.   Depression:  Yes.   and especially anxious (chronic) Memory changes:  No. Hallucinations:  No.  visual distortions: No. N/V:  No. Lightheaded:  minimally  Syncope: No. Diplopia:  No. Dyskinesia:  No.  Neuroimaging has not previously been performed.     ALLERGIES:   Allergies    Allergen Reactions  . Compazine [Prochlorperazine Edisylate] Hives  . Eliquis [Apixaban] Itching and Nausea Only  . Prednisone Nausea Only  . Prochlorperazine Other (See Comments)    unknown    CURRENT MEDICATIONS:  Outpatient Encounter Prescriptions as of 03/18/2017  Medication Sig  . amLODipine (NORVASC) 5 MG tablet Take 5 mg by mouth daily.  . ASHWAGANDHA PO Take by mouth.  Marland Kitchen aspirin EC 81 MG tablet Take 81 mg by mouth daily.  . diazepam (VALIUM) 10 MG tablet Take 10 mg by mouth 2 (two) times daily.  Marland Kitchen MAGNESIUM PO Take by mouth at bedtime as needed.  . pantoprazole (PROTONIX) 40 MG tablet Take 40 mg by mouth daily.  . ranitidine (ZANTAC) 300 MG capsule Take 300 mg by mouth every evening.  . vitamin B-12 (CYANOCOBALAMIN) 1000 MCG tablet Take 1,000 mcg by mouth daily.  . [DISCONTINUED] metoprolol succinate (TOPROL-XL) 25 MG 24 hr tablet Take 1 tablet (25 mg total) by mouth daily.   No facility-administered encounter medications on file as of 03/18/2017.     PAST MEDICAL HISTORY:   Past Medical History:  Diagnosis Date  . Abnormal EKG 01/23/2016  . Anxiety   . GERD (gastroesophageal reflux disease)   . Hypertension   . Vaginal delivery 1964  . Weakness     PAST SURGICAL HISTORY:   Past Surgical History:  Procedure Laterality Date  . CATARACT  EXTRACTION, BILATERAL     august/september 2017  . CERVICAL POLYPECTOMY N/A 08/14/2015   Procedure: CERVICAL POLYPECTOMY;  Surgeon: Newton Pigg, MD;  Location: [facility];  Service: Gynecology;  Laterality: N/A;  . CHOLECYSTECTOMY    . HEMORROIDECTOMY    . HYSTEROSCOPY W/D&C N/A 08/14/2015   Procedure: DILATATION AND CURETTAGE /HYSTEROSCOPY;  Surgeon: Newton Pigg, MD;  Location: [facility];  Service: Gynecology;  Laterality: N/A;  . PORTACATH PLACEMENT      SOCIAL HISTORY:   Social History   Social History  . Marital status: Married    Spouse name: N/A  . Number of children: N/A  . Years of education: N/A   Occupational  History  . Not on file.   Social History Main Topics  . Smoking status: Never Smoker  . Smokeless tobacco: Never Used  . Alcohol use No  . Drug use: No  . Sexual activity: Not on file   Other Topics Concern  . Not on file   Social History Narrative  . No narrative on file    FAMILY HISTORY:   Family Status  Relation Status  . Mother Deceased  . Father Deceased  . Brother Deceased  . Child Alive    ROS:  Very emotional when discussing death of her brother in 02/08/2010 and states still is not over it.  A complete 10 system review of systems was obtained and was unremarkable apart from what is mentioned above.  PHYSICAL EXAMINATION:    VITALS:   Vitals:   03/18/17 0901  BP: 120/70  Pulse: 88  SpO2: 96%  Weight: 190 lb (86.2 kg)  Height: 5\' 9"  (1.753 m)    GEN:  The patient appears stated age and is in NAD.  Tearful when discussing her brother.   HEENT:  Normocephalic, atraumatic.  The mucous membranes are moist. The superficial temporal arteries are without ropiness or tenderness. CV:  RRR Lungs:  CTAB Neck/HEME:  There are no carotid bruits bilaterally.  Neurological examination:  Orientation: The patient is alert and oriented x3. Fund of knowledge is appropriate.  Recent and remote memory are intact.  Attention and concentration are normal.    Able to name objects and repeat phrases. Cranial nerves: There is good facial symmetry. Pupils are equal round and reactive to light bilaterally. Fundoscopic exam reveals clear margins bilaterally. Extraocular muscles are intact. The visual fields are full to confrontational testing. The speech is fluent and clear. Soft palate rises symmetrically and there is no tongue deviation. Hearing is intact to conversational tone. Sensation: Sensation is intact to light and pinprick throughout (facial, trunk, extremities). Vibration is intact at the bilateral big toe. There is no extinction with double simultaneous stimulation. There is no  sensory dermatomal level identified. Motor: Strength is 5/5 in the bilateral upper and lower extremities.   Shoulder shrug is equal and symmetric.  There is no pronator drift. Deep tendon reflexes: Deep tendon reflexes are 2/4 at the bilateral biceps, triceps, brachioradialis, patella and 1/4 at the bilateral achilles. Plantar responses are downgoing bilaterally.  Movement examination: Tone: There is normal tone in the bilateral upper extremities.  The tone in the lower extremities is normal.  Abnormal movements: No rest tremor.  No postural tremor until given a weight and then mild tremor on the right.  Mild tremor noted with archimedes spirals on the right.  Some tremor noted on the R when asked to pour water from one glass to another when the full glass is in the R  hand Coordination:  There is no decremation with RAM's, with any form of RAMS, including alternating supination and pronation of the forearm, hand opening and closing, finger taps, heel taps and toe taps.   Gait and Station: The patient has no difficulty arising out of a deep-seated chair without the use of the hands. The patient's stride length is normal.    ASSESSMENT/PLAN:  1.  Essential Tremor.  -This is asymmetric essential tremor.  This can happen 30% of the time.   We discussed nature and pathophysiology.  We discussed that this can continue to gradually get worse with time.  We discussed that some medications can worsen this, as can caffeine use.  We discussed medication therapy as well as surgical therapy.  Ultimately, the patient decided to try primidone.  Risks, benefits, side effects and alternative therapies were discussed.  The opportunity to ask questions was given and they were answered to the best of my ability.  The patient expressed understanding and willingness to follow the outlined treatment protocols.    2.  Generalized Anxiety d/o  -I talked to her about addressing this with her PCP.  Not sure that valium is the  right long term medication and while she does like the valium, she also seems to want help with her anxiety d/o  3.  Follow up is anticipated in the next few months, sooner should new neurologic issues arise.  4.  Much greater than 50% of this visit was spent in counseling and coordinating care.  Total face to face time:  60 min   Cc:  Janie Morning, DO

## 2017-03-18 ENCOUNTER — Encounter: Payer: Self-pay | Admitting: Neurology

## 2017-03-18 ENCOUNTER — Ambulatory Visit (INDEPENDENT_AMBULATORY_CARE_PROVIDER_SITE_OTHER): Payer: Medicare HMO | Admitting: Neurology

## 2017-03-18 VITALS — BP 120/70 | HR 88 | Ht 69.0 in | Wt 190.0 lb

## 2017-03-18 DIAGNOSIS — F411 Generalized anxiety disorder: Secondary | ICD-10-CM

## 2017-03-18 DIAGNOSIS — G250 Essential tremor: Secondary | ICD-10-CM

## 2017-03-18 MED ORDER — PRIMIDONE 50 MG PO TABS: 50.0000 mg | ORAL_TABLET | Freq: Every day | ORAL | 3 refills | Status: DC

## 2017-03-18 NOTE — Patient Instructions (Signed)
Start Primidone 50 mg tablets. Take 1/2 tablet for 4 nights, then increase to 1 tablet. Prescription has been sent to your pharmacy. The first dose of medication can cause some dizziness/nausea that should go away after the first dose.   

## 2017-03-25 ENCOUNTER — Telehealth: Payer: Self-pay | Admitting: Neurology

## 2017-03-25 NOTE — Telephone Encounter (Signed)
P[atient wants to talk to someone about the side effects of the medication Dr Tat put her on

## 2017-03-25 NOTE — Telephone Encounter (Signed)
Patient made aware.

## 2017-03-25 NOTE — Telephone Encounter (Signed)
Spoke with patient and she states she read the side effects of Primidone.  It states that can lower red blood cells. She has a history of Leukemia and this makes her nervous.  She did take 1/2 tablet once and it helped tremor. She asked if she could take 1/2 tablet every other day. I made her aware she probably wouldn't get much benefit from that.  She does get blood work every 6 months so if there was an issue it would be detected in that. I did talk to her about rare side effects and how these still have to be reported. I told her it is ultimately what she is comfortable taking. Let her know there would be possible side effects with any medication.  Dr. Carles Collet please advise if there is an alternative.

## 2017-03-25 NOTE — Telephone Encounter (Signed)
That side effect has to do with patients taking it for seizure in dosages typical for seizure patients (250 mg tid at least).  I've personally never had this in patients with tremor at 50 q day or even 50 tid for that matter.

## 2017-04-07 ENCOUNTER — Telehealth: Payer: Self-pay | Admitting: Neurology

## 2017-04-07 NOTE — Telephone Encounter (Signed)
Caller: Mickel Baas  Urgent? No  Reason for the call: She was seen today by Dr. Vertell Limber. He wanted her to start back on her Primidone medication. She had stopped it due to being sleepy. She also had some questions regarding if it would  decreased her red blood cells? She also wanted to know could the dosage be decreased? Thanks

## 2017-04-08 NOTE — Telephone Encounter (Signed)
Discussed again with patient about high doses of Primidone (250 mg +) causing problems with RBC's. She will restart Primidone at 50 mg 1/2 tablet for 4 days then increase to one tablet daily.

## 2017-05-01 ENCOUNTER — Other Ambulatory Visit: Payer: Self-pay | Admitting: Obstetrics and Gynecology

## 2017-05-01 DIAGNOSIS — Z1231 Encounter for screening mammogram for malignant neoplasm of breast: Secondary | ICD-10-CM

## 2017-05-12 ENCOUNTER — Ambulatory Visit
Admission: RE | Admit: 2017-05-12 | Discharge: 2017-05-12 | Disposition: A | Discharge status: Home / Self Care | Payer: Medicare HMO | Source: Ambulatory Visit | Attending: Obstetrics and Gynecology | Admitting: Obstetrics and Gynecology

## 2017-05-12 DIAGNOSIS — Z1231 Encounter for screening mammogram for malignant neoplasm of breast: Secondary | ICD-10-CM

## 2017-05-16 DIAGNOSIS — I10 Essential (primary) hypertension: Secondary | ICD-10-CM | POA: Diagnosis not present

## 2017-05-16 DIAGNOSIS — J209 Acute bronchitis, unspecified: Secondary | ICD-10-CM | POA: Diagnosis not present

## 2017-05-16 DIAGNOSIS — R0603 Acute respiratory distress: Secondary | ICD-10-CM | POA: Diagnosis not present

## 2017-05-17 DIAGNOSIS — R0603 Acute respiratory distress: Secondary | ICD-10-CM | POA: Diagnosis not present

## 2017-05-17 DIAGNOSIS — J209 Acute bronchitis, unspecified: Secondary | ICD-10-CM | POA: Diagnosis not present

## 2017-05-17 DIAGNOSIS — I10 Essential (primary) hypertension: Secondary | ICD-10-CM | POA: Diagnosis not present

## 2017-05-18 DIAGNOSIS — I10 Essential (primary) hypertension: Secondary | ICD-10-CM | POA: Diagnosis not present

## 2017-05-18 DIAGNOSIS — R0603 Acute respiratory distress: Secondary | ICD-10-CM | POA: Diagnosis not present

## 2017-05-18 DIAGNOSIS — J209 Acute bronchitis, unspecified: Secondary | ICD-10-CM | POA: Diagnosis not present

## 2017-06-17 ENCOUNTER — Telehealth: Payer: Self-pay | Admitting: Neurology

## 2017-06-17 NOTE — Telephone Encounter (Signed)
PT called in regards to her medication Primidone and that she can't take this medication and wants to know if there is something else she can take

## 2017-06-17 NOTE — Telephone Encounter (Signed)
Spoke with patient.   She states Primidone makes her too sleepy. I asked if she took it at night as directed. She states, "Well I took it during the day and at night and I just slept all day and all night."  She again brought up the hesitancy with her past medical history of leukemia. See multiple prior phone notes.  Patient just does not want to take Primidone and is asking if there is an alternative.

## 2017-06-17 NOTE — Telephone Encounter (Signed)
Can try propranolol 20 mg bid but she will need to watch her BP and let us know if has any lightheadedness/dizziness, etc and make sure that she has a f/u

## 2017-06-18 MED ORDER — PROPRANOLOL HCL 20 MG PO TABS: 20.0000 mg | ORAL_TABLET | Freq: Two times a day (BID) | ORAL | 2 refills | Status: DC

## 2017-06-18 NOTE — Telephone Encounter (Signed)
Patient made aware. RX sent to pharmacy. She has follow up at the end of the month.

## 2017-06-26 NOTE — Progress Notes (Signed)
[name] was seen today in the movement disorders clinic for neurologic consultation at the request of Dr. Vertell Limber.  Pts PCP is Street, [name], MD.  The consultation is for the evaluation of R hand tremor.  Pt has seen other neurologists including Dr. Baltazar Najjar and Dr. Metta Clines.  The records that were made available to me were reviewed.  She last saw Dr. Baltazar Najjar in June, 2017.  She was being assessed for weakness and is worked up for myasthenia gravis.  Acetylcholine receptor antibodies were apparently negative.  She had a tremor at that time that was attributed to anxiety.  06/27/17 update:  Pt seen in f/u today for ET.  Started on primidone last visit but she really thought that it would affect her hx of leukemia.  I tried to reassure her that it would not but she didn't want to be on the medication.  She also thought that it caused her to be sleepy.  Just on 06/17/17, propranolol was started, 20 mg bid and she was told to monitor BP.  She is only taking it once per day "for this week and then I will start taking two."  States "I can't tell any difference but my handwriting is better."  States that she feels nervous all of the time.  States that "I have to take a lot of valium."  Is taking valium 10mg  - 1/2 to 1 tablet three times per day.  Denies cognitive dulling.  Prior meds:  Primidone: (sleepy and she didn't want to be on medication as thought that it would affect her b/c hx leukemia)    ALLERGIES:   Allergies  Allergen Reactions  . Compazine [Prochlorperazine Edisylate] Hives  . Eliquis [Apixaban] Itching and Nausea Only  . Prednisone Nausea Only  . Prochlorperazine Other (See Comments)    unknown    CURRENT MEDICATIONS:  Outpatient Encounter Prescriptions as of 06/27/2017  Medication Sig  . amLODipine (NORVASC) 5 MG tablet Take 5 mg by mouth daily.  . ASHWAGANDHA PO Take by mouth.  Marland Kitchen aspirin EC 81 MG tablet Take 81 mg by mouth daily.  . cholecalciferol (VITAMIN D) 1000 units  tablet Take 1,000 Units by mouth daily.  . diazepam (VALIUM) 10 MG tablet Take 10 mg by mouth 2 (two) times daily.  Marland Kitchen MAGNESIUM PO Take by mouth at bedtime as needed.  . pantoprazole (PROTONIX) 40 MG tablet Take 40 mg by mouth daily.  . propranolol (INDERAL) 20 MG tablet Take 1 tablet (20 mg total) by mouth 2 (two) times daily. (Patient taking differently: Take 20 mg by mouth daily. )  . ranitidine (ZANTAC) 300 MG capsule Take 300 mg by mouth every evening.  . vitamin B-12 (CYANOCOBALAMIN) 1000 MCG tablet Take 1,000 mcg by mouth daily.  . [DISCONTINUED] primidone (MYSOLINE) 50 MG tablet Take 1 tablet (50 mg total) by mouth at bedtime.   No facility-administered encounter medications on file as of 06/27/2017.     PAST MEDICAL HISTORY:   Past Medical History:  Diagnosis Date  . Abnormal EKG 01/23/2016  . Anxiety   . GERD (gastroesophageal reflux disease)   . Hypertension   . Vaginal delivery 1964  . Weakness     PAST SURGICAL HISTORY:   Past Surgical History:  Procedure Laterality Date  . CATARACT EXTRACTION, BILATERAL     august/september 2017  . CERVICAL POLYPECTOMY N/A 08/14/2015   Procedure: CERVICAL POLYPECTOMY;  Surgeon: Newton Pigg, MD;  Location: [facility];  Service: Gynecology;  Laterality:  N/A;  . CHOLECYSTECTOMY    . HEMORROIDECTOMY    . HYSTEROSCOPY W/D&C N/A 08/14/2015   Procedure: DILATATION AND CURETTAGE /HYSTEROSCOPY;  Surgeon: Newton Pigg, MD;  Location: [facility];  Service: Gynecology;  Laterality: N/A;  . PORTACATH PLACEMENT      SOCIAL HISTORY:   Social History   Social History  . Marital status: Married    Spouse name: N/A  . Number of children: N/A  . Years of education: N/A   Occupational History  . Not on file.   Social History Main Topics  . Smoking status: Never Smoker  . Smokeless tobacco: Never Used  . Alcohol use No  . Drug use: No  . Sexual activity: Not on file   Other Topics Concern  . Not on file   Social History Narrative  . No  narrative on file    FAMILY HISTORY:   Family Status  Relation Status  . Mother Deceased  . Father Deceased  . Brother Deceased  . Child Alive    ROS:  With exception of above 10 system review of systems was obtained and was unremarkable apart from what is mentioned above.  PHYSICAL EXAMINATION:    VITALS:   Vitals:   06/27/17 0834  BP: 110/70  Pulse: 68  SpO2: 96%  Weight: 188 lb (85.3 kg)  Height: 5\' 9"  (1.753 m)    GEN:  The patient appears stated age and is in NAD.  Tearful when discussing her brother.   HEENT:  Normocephalic, atraumatic.  The mucous membranes are moist. The superficial temporal arteries are without ropiness or tenderness. CV:  RRR Lungs:  CTAB Neck/HEME:  There are no carotid bruits bilaterally.  Neurological examination:  Orientation: The patient is alert and oriented x3.  Cranial nerves: There is good facial symmetry.The speech is fluent and clear. Soft palate rises symmetrically and there is no tongue deviation. Hearing is intact to conversational tone. Sensation: Sensation is intact to light and pinprick throughout (facial, trunk, extremities). Vibration is intact at the bilateral big toe. There is no extinction with double simultaneous stimulation. There is no sensory dermatomal level identified. Motor: Strength is 5/5 in the bilateral upper and lower extremities.   Shoulder shrug is equal and symmetric.  There is no pronator drift. Deep tendon reflexes: Deep tendon reflexes are 2/4 at the bilateral biceps, triceps, brachioradialis, patella and 1/4 at the bilateral achilles. Plantar responses are downgoing bilaterally.  Movement examination: Tone: There is normal tone in the bilateral upper extremities.  The tone in the lower extremities is normal.  Abnormal movements: No rest tremor.  No postural tremor even with a weight, which is improved.  Her archimedes spirals are improved.  No intention tremor Coordination:  There is no decremation with  RAM's, with any form of RAMS, including alternating supination and pronation of the forearm, hand opening and closing, finger taps, heel taps and toe taps.   Gait and Station: The patient has no difficulty arising out of a deep-seated chair without the use of the hands. The patient's stride length is normal.    ASSESSMENT/PLAN:  1.  Essential Tremor.  -This is asymmetric essential tremor.  This can happen 30% of the time.     -I told her to continue the propranolol 20 mg once per day and told her she can take the 2nd dosage as needed as she looks much better today in terms of tremor  2.  Generalized Anxiety d/o  -I talked to her again about  addressing this with her PCP.  She states that she is changing to Dr. Venetia Maxon at [place] on 07/01/17 as her PCP moved.  I would like to find another med more appropriate than daily valium.  I worry about risks of balance change and cognitive change in the future with this medication  3.  Follow up is anticipated in the next few months, sooner should new neurologic issues arise.  Much greater than 50% of this visit was spent in counseling and coordinating care.  Total face to face time:  25 min   Cc:  Street, [name], MD

## 2017-06-27 ENCOUNTER — Ambulatory Visit (INDEPENDENT_AMBULATORY_CARE_PROVIDER_SITE_OTHER): Payer: Medicare HMO | Admitting: Neurology

## 2017-06-27 ENCOUNTER — Encounter: Payer: Self-pay | Admitting: Neurology

## 2017-06-27 VITALS — BP 110/70 | HR 76 | Ht 69.0 in | Wt 188.0 lb

## 2017-06-27 DIAGNOSIS — G250 Essential tremor: Secondary | ICD-10-CM

## 2017-06-27 DIAGNOSIS — F411 Generalized anxiety disorder: Secondary | ICD-10-CM

## 2017-06-27 NOTE — Patient Instructions (Signed)
Take propranolol 20 mg once per day.  If you have a day with more shakiness, then you can take a 2nd dose if needed but not more than 2 per day.

## 2017-07-30 DIAGNOSIS — R140 Abdominal distension (gaseous): Secondary | ICD-10-CM | POA: Insufficient documentation

## 2017-11-19 DIAGNOSIS — J069 Acute upper respiratory infection, unspecified: Secondary | ICD-10-CM | POA: Diagnosis not present

## 2017-11-19 DIAGNOSIS — Z6828 Body mass index (BMI) 28.0-28.9, adult: Secondary | ICD-10-CM | POA: Diagnosis not present

## 2017-12-04 DIAGNOSIS — Z6827 Body mass index (BMI) 27.0-27.9, adult: Secondary | ICD-10-CM | POA: Diagnosis not present

## 2017-12-04 DIAGNOSIS — J22 Unspecified acute lower respiratory infection: Secondary | ICD-10-CM | POA: Diagnosis not present

## 2017-12-12 DIAGNOSIS — L900 Lichen sclerosus et atrophicus: Secondary | ICD-10-CM | POA: Diagnosis not present

## 2017-12-25 DIAGNOSIS — R252 Cramp and spasm: Secondary | ICD-10-CM | POA: Diagnosis not present

## 2017-12-25 DIAGNOSIS — E559 Vitamin D deficiency, unspecified: Secondary | ICD-10-CM | POA: Diagnosis not present

## 2017-12-25 DIAGNOSIS — F411 Generalized anxiety disorder: Secondary | ICD-10-CM | POA: Diagnosis not present

## 2017-12-25 DIAGNOSIS — G250 Essential tremor: Secondary | ICD-10-CM | POA: Diagnosis not present

## 2017-12-25 DIAGNOSIS — K219 Gastro-esophageal reflux disease without esophagitis: Secondary | ICD-10-CM | POA: Diagnosis not present

## 2017-12-25 DIAGNOSIS — Z79899 Other long term (current) drug therapy: Secondary | ICD-10-CM | POA: Diagnosis not present

## 2017-12-25 DIAGNOSIS — I480 Paroxysmal atrial fibrillation: Secondary | ICD-10-CM | POA: Diagnosis not present

## 2017-12-25 DIAGNOSIS — D519 Vitamin B12 deficiency anemia, unspecified: Secondary | ICD-10-CM | POA: Diagnosis not present

## 2017-12-25 DIAGNOSIS — I10 Essential (primary) hypertension: Secondary | ICD-10-CM | POA: Diagnosis not present

## 2017-12-29 ENCOUNTER — Ambulatory Visit: Payer: Medicare HMO | Admitting: Neurology

## 2018-01-14 DIAGNOSIS — M48061 Spinal stenosis, lumbar region without neurogenic claudication: Secondary | ICD-10-CM | POA: Diagnosis not present

## 2018-01-14 DIAGNOSIS — F411 Generalized anxiety disorder: Secondary | ICD-10-CM | POA: Diagnosis not present

## 2018-01-14 DIAGNOSIS — Z6828 Body mass index (BMI) 28.0-28.9, adult: Secondary | ICD-10-CM | POA: Diagnosis not present

## 2018-01-14 DIAGNOSIS — I480 Paroxysmal atrial fibrillation: Secondary | ICD-10-CM | POA: Diagnosis not present

## 2018-01-14 DIAGNOSIS — I10 Essential (primary) hypertension: Secondary | ICD-10-CM | POA: Diagnosis not present

## 2018-01-15 DIAGNOSIS — S336XXA Sprain of sacroiliac joint, initial encounter: Secondary | ICD-10-CM | POA: Diagnosis not present

## 2018-01-15 DIAGNOSIS — S335XXA Sprain of ligaments of lumbar spine, initial encounter: Secondary | ICD-10-CM | POA: Diagnosis not present

## 2018-01-15 DIAGNOSIS — M9904 Segmental and somatic dysfunction of sacral region: Secondary | ICD-10-CM | POA: Diagnosis not present

## 2018-01-15 DIAGNOSIS — M9903 Segmental and somatic dysfunction of lumbar region: Secondary | ICD-10-CM | POA: Diagnosis not present

## 2018-01-15 DIAGNOSIS — M9902 Segmental and somatic dysfunction of thoracic region: Secondary | ICD-10-CM | POA: Diagnosis not present

## 2018-01-19 DIAGNOSIS — J309 Allergic rhinitis, unspecified: Secondary | ICD-10-CM | POA: Diagnosis not present

## 2018-01-19 DIAGNOSIS — Z6827 Body mass index (BMI) 27.0-27.9, adult: Secondary | ICD-10-CM | POA: Diagnosis not present

## 2018-01-19 DIAGNOSIS — R51 Headache: Secondary | ICD-10-CM | POA: Diagnosis not present

## 2018-01-26 DIAGNOSIS — M9903 Segmental and somatic dysfunction of lumbar region: Secondary | ICD-10-CM | POA: Diagnosis not present

## 2018-01-26 DIAGNOSIS — S336XXA Sprain of sacroiliac joint, initial encounter: Secondary | ICD-10-CM | POA: Diagnosis not present

## 2018-01-26 DIAGNOSIS — M9904 Segmental and somatic dysfunction of sacral region: Secondary | ICD-10-CM | POA: Diagnosis not present

## 2018-01-26 DIAGNOSIS — M9902 Segmental and somatic dysfunction of thoracic region: Secondary | ICD-10-CM | POA: Diagnosis not present

## 2018-01-26 DIAGNOSIS — S335XXA Sprain of ligaments of lumbar spine, initial encounter: Secondary | ICD-10-CM | POA: Diagnosis not present

## 2018-01-28 DIAGNOSIS — M9903 Segmental and somatic dysfunction of lumbar region: Secondary | ICD-10-CM | POA: Diagnosis not present

## 2018-01-28 DIAGNOSIS — S336XXA Sprain of sacroiliac joint, initial encounter: Secondary | ICD-10-CM | POA: Diagnosis not present

## 2018-01-28 DIAGNOSIS — S335XXA Sprain of ligaments of lumbar spine, initial encounter: Secondary | ICD-10-CM | POA: Diagnosis not present

## 2018-01-28 DIAGNOSIS — M9902 Segmental and somatic dysfunction of thoracic region: Secondary | ICD-10-CM | POA: Diagnosis not present

## 2018-01-28 DIAGNOSIS — M9904 Segmental and somatic dysfunction of sacral region: Secondary | ICD-10-CM | POA: Diagnosis not present

## 2018-02-03 DIAGNOSIS — M9903 Segmental and somatic dysfunction of lumbar region: Secondary | ICD-10-CM | POA: Diagnosis not present

## 2018-02-03 DIAGNOSIS — S336XXA Sprain of sacroiliac joint, initial encounter: Secondary | ICD-10-CM | POA: Diagnosis not present

## 2018-02-03 DIAGNOSIS — M9904 Segmental and somatic dysfunction of sacral region: Secondary | ICD-10-CM | POA: Diagnosis not present

## 2018-02-03 DIAGNOSIS — S335XXA Sprain of ligaments of lumbar spine, initial encounter: Secondary | ICD-10-CM | POA: Diagnosis not present

## 2018-02-03 DIAGNOSIS — M9902 Segmental and somatic dysfunction of thoracic region: Secondary | ICD-10-CM | POA: Diagnosis not present

## 2018-02-04 DIAGNOSIS — I10 Essential (primary) hypertension: Secondary | ICD-10-CM | POA: Diagnosis not present

## 2018-02-04 DIAGNOSIS — Z6828 Body mass index (BMI) 28.0-28.9, adult: Secondary | ICD-10-CM | POA: Diagnosis not present

## 2018-02-04 DIAGNOSIS — R51 Headache: Secondary | ICD-10-CM | POA: Diagnosis not present

## 2018-02-04 DIAGNOSIS — J309 Allergic rhinitis, unspecified: Secondary | ICD-10-CM | POA: Diagnosis not present

## 2018-02-07 DIAGNOSIS — R51 Headache: Secondary | ICD-10-CM | POA: Diagnosis not present

## 2018-02-07 DIAGNOSIS — R110 Nausea: Secondary | ICD-10-CM | POA: Diagnosis not present

## 2018-02-09 ENCOUNTER — Encounter: Payer: Self-pay | Admitting: Internal Medicine

## 2018-02-10 DIAGNOSIS — H401434 Capsular glaucoma with pseudoexfoliation of lens, bilateral, indeterminate stage: Secondary | ICD-10-CM | POA: Diagnosis not present

## 2018-02-10 DIAGNOSIS — H31101 Choroidal degeneration, unspecified, right eye: Secondary | ICD-10-CM | POA: Diagnosis not present

## 2018-02-11 DIAGNOSIS — L900 Lichen sclerosus et atrophicus: Secondary | ICD-10-CM | POA: Diagnosis not present

## 2018-02-23 DIAGNOSIS — Z6828 Body mass index (BMI) 28.0-28.9, adult: Secondary | ICD-10-CM | POA: Diagnosis not present

## 2018-02-23 DIAGNOSIS — R51 Headache: Secondary | ICD-10-CM | POA: Diagnosis not present

## 2018-02-24 DIAGNOSIS — R51 Headache: Secondary | ICD-10-CM | POA: Diagnosis not present

## 2018-02-25 DIAGNOSIS — H401434 Capsular glaucoma with pseudoexfoliation of lens, bilateral, indeterminate stage: Secondary | ICD-10-CM | POA: Diagnosis not present

## 2018-03-06 ENCOUNTER — Telehealth: Payer: Self-pay | Admitting: Neurology

## 2018-03-06 NOTE — Telephone Encounter (Signed)
Received note from patient's primary care physician.  Was a copy of the patient's visit dated February 04, 2018.  Reported that the patient has some "all over headaches as well with some relief with putting pressure with her hands and her head."  He thought the patient's headaches were likely due to blood pressure elevation, as the patient had stopped taking her blood pressure medication.  She was told to restart amlodipine.  The patient followed back up on February 23, 2018 and the patient reported a "constant ache and sometimes a pressure across her forehead and worse on the left."  Patient apparently was seen by her optometrist in [place], Dr. Renaldo Fiddler, and he called the patient's primary care physician.  I do not have copies of his notes.  Her eye pressure was normal.  He was concerned for possible temporal arteritis.  Patient's primary care physician did lab work.  Her sedimentation rate was 0.00.  Has not contacted [place] in this regard.  However, with this sed rate temporal arteritis is unlikely, as patient had reported symptoms for months.  She was not on steroids when labs were drawn.

## 2018-03-09 ENCOUNTER — Telehealth: Payer: Self-pay | Admitting: Neurology

## 2018-03-09 DIAGNOSIS — Z79899 Other long term (current) drug therapy: Secondary | ICD-10-CM | POA: Diagnosis not present

## 2018-03-09 DIAGNOSIS — Z7982 Long term (current) use of aspirin: Secondary | ICD-10-CM | POA: Diagnosis not present

## 2018-03-09 DIAGNOSIS — M546 Pain in thoracic spine: Secondary | ICD-10-CM | POA: Diagnosis not present

## 2018-03-09 DIAGNOSIS — R1011 Right upper quadrant pain: Secondary | ICD-10-CM | POA: Diagnosis not present

## 2018-03-09 DIAGNOSIS — R1031 Right lower quadrant pain: Secondary | ICD-10-CM | POA: Diagnosis not present

## 2018-03-09 DIAGNOSIS — R109 Unspecified abdominal pain: Secondary | ICD-10-CM | POA: Diagnosis not present

## 2018-03-09 NOTE — Telephone Encounter (Signed)
Received optometry notes from [facility].    Pt had normal VF testing.  Has capsular glaucoma.  Doesn't mention anything about TA although does state waiting of ESR to proceed with plan.  ESR was 0.

## 2018-03-30 ENCOUNTER — Ambulatory Visit: Payer: Medicare HMO | Admitting: Internal Medicine

## 2018-03-30 VITALS — BP 106/60 | HR 68 | Ht 68.0 in | Wt 183.0 lb

## 2018-03-30 DIAGNOSIS — K22719 Barrett's esophagus with dysplasia, unspecified: Secondary | ICD-10-CM

## 2018-03-30 DIAGNOSIS — K219 Gastro-esophageal reflux disease without esophagitis: Secondary | ICD-10-CM

## 2018-03-30 DIAGNOSIS — R140 Abdominal distension (gaseous): Secondary | ICD-10-CM | POA: Diagnosis not present

## 2018-03-30 NOTE — Patient Instructions (Signed)
You have been scheduled for an endoscopy. Please follow written instructions given to you at your visit today. If you use inhalers (even only as needed), please bring them with you on the day of your procedure. Your physician has requested that you go to www.startemmi.com and enter the access code given to you at your visit today. This web site gives a general overview about your procedure. However, you should still follow specific instructions given to you by our office regarding your preparation for the procedure.  Take Align once a day for one month

## 2018-03-30 NOTE — Progress Notes (Signed)
[name] was seen today in the movement disorders clinic for neurologic consultation at the request of Dr. Vertell Limber.  Pts PCP is Street, Denise Mt, MD.  The consultation is for the evaluation of R hand tremor.  Pt has seen other neurologists including Dr. Baltazar Najjar and Dr. Metta Clines.  The records that were made available to me were reviewed.  She last saw Dr. Baltazar Najjar in June, 2017.  She was being assessed for weakness and is worked up for myasthenia gravis.  Acetylcholine receptor antibodies were apparently negative.  She had a tremor at that time that was attributed to anxiety.  06/27/17 update:  Pt seen in f/u today for ET.  Started on primidone last visit but she really thought that it would affect her hx of leukemia.  I tried to reassure her that it would not but she didn't want to be on the medication.  She also thought that it caused her to be sleepy.  Just on 06/17/17, propranolol was started, 20 mg bid and she was told to monitor BP.  She is only taking it once per day "for this week and then I will start taking two."  States "I can't tell any difference but my handwriting is better."  States that she feels nervous all of the time.  States that "I have to take a lot of valium."  Is taking valium 10mg  - 1/2 to 1 tablet three times per day.  Denies cognitive dulling.  03/31/18 update: Patient is seen today in follow-up.  She is accompanied by her daughter who supplements the history.   Reports that she took propranolol for a few weeks and did fine and then had one day where woke up and didn't feel well, so she stopped the medication.    Last visit, I talked to the patient about the amount of Valium that she was taking, especially given her age.  I had told her to talk with her primary care physician about potentially finding another medication for her anxiety.  She reports today that she still takes the valium, 1/2 - 1 pill in the AM and 1/2-1 pill at night.  Occasionally takes one in the middle of the  day.  "I still stay nervous."  I did get a copy of a note from the patient's primary care physician dated February 04, 2018.  Patient was reporting headaches.  Primary care physician thought that the patient's headaches were likely due to blood pressure elevation, as the patient had stopped her amlodipine.  She restarted the medication and followed up with her primary care physician a few weeks later and still reported headache.  She was subsequently seen by her optometrist and apparently he was concerned for possible temporal arteritis.  Her sedimentation rate was 0.00.  She was not on steroids when the lab work was drawn.  Patient had normal visual field testing.  Pt states that when headache first started it was behind the L eye and was throbbing and would radiate to the L vertex.  She reports that headache then progressed to be holocephalic and worse with bending over.  This has gotten much better.  She has none today.  It is still worse with bending over but its an ache now and no throbbing.  No emesis but occasional nausea.  No visual change.  She was taking ibuprofen and that helped but no need for it now.  She had a CT brain without contrast on 02/07/18 and it is reported to  be neg.  Done at [place] when she had headache.  Daughter brought her records from [place] and I reviewed them.    Prior meds:  Primidone: (sleepy and she didn't want to be on medication as thought that it would affect her b/c hx leukemia)    ALLERGIES:   Allergies  Allergen Reactions  . Compazine [Prochlorperazine Edisylate] Hives  . Eliquis [Apixaban] Itching and Nausea Only  . Prednisone Nausea Only  . Prochlorperazine Other (See Comments)    unknown    CURRENT MEDICATIONS:  Outpatient Encounter Medications as of 03/31/2018  Medication Sig  . amLODipine (NORVASC) 5 MG tablet Take 5 mg by mouth daily.  Marland Kitchen aspirin EC 81 MG tablet Take 81 mg by mouth daily.  . cholecalciferol (VITAMIN D) 1000 units tablet Take 1,000 Units  by mouth daily.  . diazepam (VALIUM) 10 MG tablet Take 10 mg by mouth 2 (two) times daily.  Marland Kitchen MAGNESIUM PO Take by mouth at bedtime as needed.  . pantoprazole (PROTONIX) 40 MG tablet Take 40 mg by mouth daily.  . ranitidine (ZANTAC) 300 MG capsule Take 300 mg by mouth every evening.  . vitamin B-12 (CYANOCOBALAMIN) 1000 MCG tablet Take 1,000 mcg by mouth daily.  . [DISCONTINUED] ASHWAGANDHA PO Take by mouth.  . [DISCONTINUED] propranolol (INDERAL) 20 MG tablet Take 1 tablet (20 mg total) by mouth 2 (two) times daily. (Patient taking differently: Take 20 mg by mouth daily. )   No facility-administered encounter medications on file as of 03/31/2018.     PAST MEDICAL HISTORY:   Past Medical History:  Diagnosis Date  . Abnormal EKG 01/23/2016  . Anxiety   . Atrial fibrillation ([place])   . Barrett's esophagus   . GERD (gastroesophageal reflux disease)   . Hypertension   . Spinal stenosis   . Vaginal delivery 1964  . Weakness     PAST SURGICAL HISTORY:   Past Surgical History:  Procedure Laterality Date  . CATARACT EXTRACTION, BILATERAL     august/september 2017  . CERVICAL POLYPECTOMY N/A 08/14/2015   Procedure: CERVICAL POLYPECTOMY;  Surgeon: Newton Pigg, MD;  Location: [facility];  Service: Gynecology;  Laterality: N/A;  . CHOLECYSTECTOMY    . HEMORROIDECTOMY    . HYSTEROSCOPY W/D&C N/A 08/14/2015   Procedure: DILATATION AND CURETTAGE /HYSTEROSCOPY;  Surgeon: Newton Pigg, MD;  Location: [facility];  Service: Gynecology;  Laterality: N/A;  . PORTACATH PLACEMENT      SOCIAL HISTORY:   Social History   Socioeconomic History  . Marital status: Married    Spouse name: Not on file  . Number of children: Not on file  . Years of education: Not on file  . Highest education level: Not on file  Occupational History  . Not on file  Social Needs  . Financial resource strain: Not on file  . Food insecurity:    Worry: Not on file    Inability: Not on file  . Transportation needs:     Medical: Not on file    Non-medical: Not on file  Tobacco Use  . Smoking status: Never Smoker  . Smokeless tobacco: Never Used  Substance and Sexual Activity  . Alcohol use: No  . Drug use: No  . Sexual activity: Not on file  Lifestyle  . Physical activity:    Days per week: Not on file    Minutes per session: Not on file  . Stress: Not on file  Relationships  . Social connections:    Talks on phone:  Not on file    Gets together: Not on file    Attends religious service: Not on file    Active member of club or organization: Not on file    Attends meetings of clubs or organizations: Not on file    Relationship status: Not on file  . Intimate partner violence:    Fear of current or ex partner: Not on file    Emotionally abused: Not on file    Physically abused: Not on file    Forced sexual activity: Not on file  Other Topics Concern  . Not on file  Social History Narrative  . Not on file    FAMILY HISTORY:   Family Status  Relation Name Status  . Mother  Deceased  . Father  Deceased  . Brother  Deceased  . Child  Alive    ROS:  With exception of above 10 system review of systems was obtained and was unremarkable apart from what is mentioned above.  PHYSICAL EXAMINATION:    VITALS:   Vitals:   03/31/18 1450  BP: 118/78  Pulse: 68  SpO2: 97%  Weight: 184 lb (83.5 kg)  Height: 5\' 8"  (1.727 m)    Gen:  Appears stated age and in NAD. HEENT:  Normocephalic, atraumatic. The mucous membranes are moist. The superficial temporal arteries are without ropiness or tenderness. Cardiovascular: Regular rate and rhythm. Lungs: Clear to auscultation bilaterally. Neck: There are no carotid bruits noted bilaterally.  NEUROLOGICAL:  Orientation:  The patient is alert and oriented x 3.   Cranial nerves: There is good facial symmetry.  Extraocular muscles are intact and visual fields are full to confrontational testing. Speech is fluent and clear. Soft palate rises  symmetrically and there is no tongue deviation. Hearing is intact to conversational tone. Tone: Tone is good throughout. Sensation: Sensation is intact to light touch Coordination:  The patient has no difficulty with RAM's or FNF bilaterally. Motor: Strength is 5/5 in the bilateral upper and lower extremities.  Shoulder shrug is equal bilaterally.  There is no pronator drift.  There are no fasciculations noted. Gait and Station: The patient is able to ambulate without difficulty.   Labs:  Patient had lab work dated February 07, 2018.  White blood cells were 5.5, hemoglobin 15.1, hematocrit 45.1 and platelets 197.  Sodium was 141, potassium 3.9, chloride 109, CO2 24, BUN 19 and creatinine 0.80.  Glucose was elevated at 145.    ASSESSMENT/PLAN:  1.  Essential Tremor.  -almost no tremor noted today.  we will keep her off medication.  2.  Headache  -I have very little concern for temporal arteritis.  Her sedimentation rate was 0.  Her headaches are getting markedly better compared to a few months ago.  Because she notes headache primarily with Valsalva maneuver, we decided to go ahead and schedule an MRI and MRA of the brain to make sure we are not missing something (tumor, stenosis).  I did tell her that she should expect to have the results show some small vessel disease and atrophy given her age, but want to make sure we are not missing something else.  -She does not feel that she needs medication for headache since it is much better than it was.  She will let me know if this changes.  3.  Generalized anxiety disorder  -Still on Valium.  I would like to see her on an alternative medication.  4.  We will call her/daughter  with the results  of the MRI/MRA.  If they were nonacute, then she will follow-up on an as-needed basis.     Cc:  Street, Denise Mt, MD

## 2018-03-30 NOTE — Progress Notes (Signed)
HISTORY OF PRESENT ILLNESS:  [name] is a 77 y.o. female who is self-referred today with complaints of GERD, Barrett's esophagus, and chronic abdominal bloating. She has seen multiple gastroenterologists including Dr. Collene Mares, Dr. Lyndel Safe, [place], Dr. Dolphus Jenny, and most recently Dr. Shana Chute November 2018. She tells me that she has seen multiple gastroenterologists because "she didn't care for them". In any event, the patient reports long-standing problems with heartburn and indigestion. Currently on pantoprazole 40 mg daily which works. She does have a history of Barrett's esophagus. Last upper endoscopy last year with inflammation but no dysplasia. She was told the follow-up in 1 year. She denies dysphagia. Prior to making this appointment she was having problems with nonspecific nausea. This has resolved. She also complains of many years worth of chronic bloating. Worse after meals. Her weight fluctuates but tends to increase over time. No bleeding. She tells me that she has had 3 prior complete colonoscopies which have been negative for polyps. Last examination less than 10 years ago. No melena or hematochezia. She tells me that she is known to have gastric polyps. She is requesting a surveillance endoscopy and something for her bloating. Review of outside records shows unremarkable abdominal ultrasound status post cholecystectomy. Review of outside laboratories also unremarkable. Last hemoglobin April 2018 was 15.2.  REVIEW OF SYSTEMS:  All non-GI ROS negative unless otherwise stated in the history of present illness except for anxiety, back pain, depression, hearing problems  Past Medical History:  Diagnosis Date  . Abnormal EKG 01/23/2016  . Anxiety   . Atrial fibrillation ([place])   . Barrett's esophagus   . GERD (gastroesophageal reflux disease)   . Hypertension   . Spinal stenosis   . Vaginal delivery 1964  . Weakness     Past Surgical History:  Procedure Laterality Date  . CATARACT  EXTRACTION, BILATERAL     august/september 2017  . CERVICAL POLYPECTOMY N/A 08/14/2015   Procedure: CERVICAL POLYPECTOMY;  Surgeon: Newton Pigg, MD;  Location: [facility];  Service: Gynecology;  Laterality: N/A;  . CHOLECYSTECTOMY    . HEMORROIDECTOMY    . HYSTEROSCOPY W/D&C N/A 08/14/2015   Procedure: DILATATION AND CURETTAGE /HYSTEROSCOPY;  Surgeon: Newton Pigg, MD;  Location: [facility];  Service: Gynecology;  Laterality: N/A;  . PORTACATH PLACEMENT      Social History BETTYANN BIRCHLER  reports that she has never smoked. She has never used smokeless tobacco. She reports that she does not drink alcohol or use drugs.  family history includes Alzheimer's disease in her brother; CVA in her mother; Heart attack in her brother and mother; Heart disease in her brother and mother; Lymphoma in her father.  Allergies  Allergen Reactions  . Compazine [Prochlorperazine Edisylate] Hives  . Eliquis [Apixaban] Itching and Nausea Only  . Prednisone Nausea Only  . Prochlorperazine Other (See Comments)    unknown       PHYSICAL EXAMINATION: Vital signs: BP 106/60   Pulse 68   Ht 5\' 8"  (1.727 m)   Wt 183 lb (83 kg)   BMI 27.83 kg/m   Constitutional: overweight,generally well-appearing, no acute distress. Hard of hearing Psychiatric: alert and oriented x3, cooperative Eyes: extraocular movements intact, anicteric, conjunctiva pink Ears: Hearing aids Mouth: oral pharynx moist, no lesions, slight geographic tongue Neck: supple without lymphadenopathy Lymph: no lymphadenopathy Cardiovascular: heart regular rate and rhythm, no murmur Lungs: clear to auscultation bilaterally Abdomen: soft,obese, nontender, nondistended, no obvious ascites, no peritoneal signs, normal bowel sounds, no organomegaly Rectal:omitted Extremities: no clubbing,  cyanosis, or lower extremity edema bilaterally Skin: no lesions on visible extremities Neuro: No focal deficits. Normal DTRs. Cranial nerves  intact  ASSESSMENT:  #1. Chronic GERD. Classic symptoms controlled with PPI #2. History of Barrett's esophagus. Last EGD last year without dysplasia #3. History of gastric polyps. Unspecified #4. Bloating. Chronic #5. Obesity #6. Colon cancer screening. Based on history, has aged out   PLAN:  #1. Reflux precautions #2. Weight loss #3. Discussion on abdominal bloating #4. Literature on bloating and gas provided for her review #5. Provided with samples of probiotic align one daily for 4 weeks to see if this helps with bloating #6. Schedule upper endoscopy with biopsies to evaluate Barrett's and question of gastric polyps.The nature of the procedure, as well as the risks, benefits, and alternatives were carefully and thoroughly reviewed with the patient. Ample time for discussion and questions allowed. The patient understood, was satisfied, and agreed to proceed. #7. Continue omeprazole 40 mg daily #8. Routine GI follow-up likely 1 year

## 2018-03-31 ENCOUNTER — Ambulatory Visit (INDEPENDENT_AMBULATORY_CARE_PROVIDER_SITE_OTHER): Payer: Medicare HMO | Admitting: Neurology

## 2018-03-31 ENCOUNTER — Encounter: Payer: Self-pay | Admitting: Neurology

## 2018-03-31 VITALS — BP 118/78 | HR 68 | Ht 68.0 in | Wt 184.0 lb

## 2018-03-31 DIAGNOSIS — R519 Headache, unspecified: Secondary | ICD-10-CM

## 2018-03-31 DIAGNOSIS — G250 Essential tremor: Secondary | ICD-10-CM | POA: Diagnosis not present

## 2018-03-31 DIAGNOSIS — F411 Generalized anxiety disorder: Secondary | ICD-10-CM

## 2018-03-31 DIAGNOSIS — R51 Headache: Secondary | ICD-10-CM | POA: Diagnosis not present

## 2018-03-31 NOTE — Patient Instructions (Signed)
1. We have sent a referral to [facility] for your MRI and they will call you directly to schedule your appt. They are located at [address] If you need to contact them directly please call [phone].   

## 2018-04-02 NOTE — Addendum Note (Signed)
Addended byAnnamaria Helling on: 04/02/2018 07:51 AM   Modules accepted: Orders

## 2018-04-07 ENCOUNTER — Telehealth: Payer: Self-pay | Admitting: *Deleted

## 2018-04-07 ENCOUNTER — Other Ambulatory Visit: Payer: Medicare HMO

## 2018-04-07 ENCOUNTER — Ambulatory Visit
Admission: RE | Admit: 2018-04-07 | Discharge: 2018-04-07 | Disposition: A | Discharge status: Home / Self Care | Payer: Medicare HMO | Source: Ambulatory Visit | Attending: Neurology | Admitting: Neurology

## 2018-04-07 DIAGNOSIS — R519 Headache, unspecified: Secondary | ICD-10-CM

## 2018-04-07 DIAGNOSIS — R51 Headache: Principal | ICD-10-CM

## 2018-04-07 DIAGNOSIS — G44009 Cluster headache syndrome, unspecified, not intractable: Secondary | ICD-10-CM | POA: Diagnosis not present

## 2018-04-07 NOTE — Telephone Encounter (Signed)
I called patient and gave her the results.  She said that she is still having her headaches about once a week.  She would like a follow up with Dr. Carles Collet and will call back to schedule.

## 2018-04-07 NOTE — Telephone Encounter (Signed)
-----   Message from [place], DO sent at 04/07/2018 11:04 AM EDT ----- Let pt know that MRI brain non acute.  Just shows some hardening of arteries in brain (as I told her it would) but otherwise unremarkable.

## 2018-04-08 NOTE — Progress Notes (Signed)
[name] was seen today in the movement disorders clinic for neurologic consultation at the request of Dr. Vertell Limber.  Pts PCP is Street, [name], MD.  The consultation is for the evaluation of R hand tremor.  Pt has seen other neurologists including Dr. Baltazar Najjar and Dr. Metta Clines.  The records that were made available to me were reviewed.  She last saw Dr. Baltazar Najjar in June, 2017.  She was being assessed for weakness and is worked up for myasthenia gravis.  Acetylcholine receptor antibodies were apparently negative.  She had a tremor at that time that was attributed to anxiety.  06/27/17 update:  Pt seen in f/u today for ET.  Started on primidone last visit but she really thought that it would affect her hx of leukemia.  I tried to reassure her that it would not but she didn't want to be on the medication.  She also thought that it caused her to be sleepy.  Just on 06/17/17, propranolol was started, 20 mg bid and she was told to monitor BP.  She is only taking it once per day "for this week and then I will start taking two."  States "I can't tell any difference but my handwriting is better."  States that she feels nervous all of the time.  States that "I have to take a lot of valium."  Is taking valium 10mg  - 1/2 to 1 tablet three times per day.  Denies cognitive dulling.  03/31/18 update: Patient is seen today in follow-up.  She is accompanied by her daughter who supplements the history.   Reports that she took propranolol for a few weeks and did fine and then had one day where woke up and didn't feel well, so she stopped the medication.    Last visit, I talked to the patient about the amount of Valium that she was taking, especially given her age.  I had told her to talk with her primary care physician about potentially finding another medication for her anxiety.  She reports today that she still takes the valium, 1/2 - 1 pill in the AM and 1/2-1 pill at night.  Occasionally takes one in the middle of the  day.  "I still stay nervous."  I did get a copy of a note from the patient's primary care physician dated February 04, 2018.  Patient was reporting headaches.  Primary care physician thought that the patient's headaches were likely due to blood pressure elevation, as the patient had stopped her amlodipine.  She restarted the medication and followed up with her primary care physician a few weeks later and still reported headache.  She was subsequently seen by her optometrist and apparently he was concerned for possible temporal arteritis.  Her sedimentation rate was 0.00.  She was not on steroids when the lab work was drawn.  Patient had normal visual field testing.  Pt states that when headache first started it was behind the L eye and was throbbing and would radiate to the L vertex.  She reports that headache then progressed to be holocephalic and worse with bending over.  This has gotten much better.  She has none today.  It is still worse with bending over but its an ache now and no throbbing.  No emesis but occasional nausea.  No visual change.  She was taking ibuprofen and that helped but no need for it now.  She had a CT brain without contrast on 02/07/18 and it is reported to  be neg.  Done at [place] when she had headache.  Daughter brought her records from [place] and I reviewed them.    04/10/18 update: Patient requested follow-up today to go over her MRI of the brain.  MRI was completed on April 07, 2018.  I personally reviewed this.  MRI of the brain demonstrated moderate white matter disease.  She was supposed to have an MRV of the brain but I don't see that this was done.  The desire was to cancel the MRA brain and just have MRV done.  The MRV apparently was denied by insurance.  I never got a peer to peer request.  Pt states that a nurse at MRI told her to journal about her headache.  She reports that had a small headache yesterday and she took ibuprofen and it went away.  "I just want to know why I have  them."  Having some left eye pain but eye doctor told her unrelated.  Doesn't happen daily.  Headache makes her a little nauseated but "its not bad."  Prior meds:  Primidone: (sleepy and she didn't want to be on medication as thought that it would affect her b/c hx leukemia)    ALLERGIES:   Allergies  Allergen Reactions  . Compazine [Prochlorperazine Edisylate] Hives  . Eliquis [Apixaban] Itching and Nausea Only  . Prednisone Nausea Only  . Prochlorperazine Other (See Comments)    unknown    CURRENT MEDICATIONS:  Outpatient Encounter Medications as of 04/10/2018  Medication Sig  . amLODipine (NORVASC) 5 MG tablet Take 5 mg by mouth daily.  Marland Kitchen aspirin EC 81 MG tablet Take 81 mg by mouth daily.  . cholecalciferol (VITAMIN D) 1000 units tablet Take 1,000 Units by mouth daily.  . diazepam (VALIUM) 10 MG tablet Take 10 mg by mouth 2 (two) times daily.  Marland Kitchen MAGNESIUM PO Take by mouth at bedtime as needed.  . pantoprazole (PROTONIX) 40 MG tablet Take 40 mg by mouth daily.  . ranitidine (ZANTAC) 300 MG capsule Take 300 mg by mouth every evening.  . vitamin B-12 (CYANOCOBALAMIN) 1000 MCG tablet Take 1,000 mcg by mouth daily.   No facility-administered encounter medications on file as of 04/10/2018.     PAST MEDICAL HISTORY:   Past Medical History:  Diagnosis Date  . Abnormal EKG 01/23/2016  . Anxiety   . Atrial fibrillation ([place])   . Barrett's esophagus   . GERD (gastroesophageal reflux disease)   . Hypertension   . Spinal stenosis   . Vaginal delivery 1964  . Weakness     PAST SURGICAL HISTORY:   Past Surgical History:  Procedure Laterality Date  . CATARACT EXTRACTION, BILATERAL     august/september 2017  . CERVICAL POLYPECTOMY N/A 08/14/2015   Procedure: CERVICAL POLYPECTOMY;  Surgeon: Newton Pigg, MD;  Location: [facility];  Service: Gynecology;  Laterality: N/A;  . CHOLECYSTECTOMY    . HEMORROIDECTOMY    . HYSTEROSCOPY W/D&C N/A 08/14/2015   Procedure: DILATATION AND  CURETTAGE /HYSTEROSCOPY;  Surgeon: Newton Pigg, MD;  Location: [facility];  Service: Gynecology;  Laterality: N/A;  . PORTACATH PLACEMENT      SOCIAL HISTORY:   Social History   Socioeconomic History  . Marital status: Married    Spouse name: Not on file  . Number of children: Not on file  . Years of education: Not on file  . Highest education level: Not on file  Occupational History  . Not on file  Social Needs  . Emergency planning/management officer  strain: Not on file  . Food insecurity:    Worry: Not on file    Inability: Not on file  . Transportation needs:    Medical: Not on file    Non-medical: Not on file  Tobacco Use  . Smoking status: Never Smoker  . Smokeless tobacco: Never Used  Substance and Sexual Activity  . Alcohol use: No  . Drug use: No  . Sexual activity: Not on file  Lifestyle  . Physical activity:    Days per week: Not on file    Minutes per session: Not on file  . Stress: Not on file  Relationships  . Social connections:    Talks on phone: Not on file    Gets together: Not on file    Attends religious service: Not on file    Active member of club or organization: Not on file    Attends meetings of clubs or organizations: Not on file    Relationship status: Not on file  . Intimate partner violence:    Fear of current or ex partner: Not on file    Emotionally abused: Not on file    Physically abused: Not on file    Forced sexual activity: Not on file  Other Topics Concern  . Not on file  Social History Narrative  . Not on file    FAMILY HISTORY:   Family Status  Relation Name Status  . Mother  Deceased  . Father  Deceased  . Brother  Deceased  . Child  Alive    ROS:  With exception of above 10 system review of systems was obtained and was unremarkable apart from what is mentioned above.  PHYSICAL EXAMINATION:    VITALS:   Vitals:   04/10/18 0841  BP: 140/66  Pulse: 62  SpO2: 95%  Weight: 185 lb (83.9 kg)  Height: 5\' 8"  (1.727 m)    GEN:   The patient appears stated age and is in NAD. HEENT:  Normocephalic, atraumatic.  The mucous membranes are moist. The superficial temporal arteries are without ropiness or tenderness. CV:  RRR Lungs:  CTAB Neck/HEME:  There are no carotid bruits bilaterally.  Neurological examination:  Orientation: The patient is alert and oriented x3. Cranial nerves: There is good facial symmetry. The speech is fluent and clear. Soft palate rises symmetrically and there is no tongue deviation. Hearing is intact to conversational tone. Sensation: Sensation is intact to light touch throughout Motor: Strength is 5/5 in the bilateral upper and lower extremities.   Shoulder shrug is equal and symmetric.  There is no pronator drift.  Movement examination: Tone: There is normal tone in the UE/LE. Abnormal movements: no tremor noted today Coordination:  There is no decremation with RAM's, with any form of RAMS, including alternating supination and pronation of the forearm, hand opening and closing, finger taps, heel taps and toe taps. Gait and Station: The patient has no difficulty arising out of a deep-seated chair without the use of the hands. The patient's stride length is good.      Labs:  Patient had lab work dated February 07, 2018.  White blood cells were 5.5, hemoglobin 15.1, hematocrit 45.1 and platelets 197.  Sodium was 141, potassium 3.9, chloride 109, CO2 24, BUN 19 and creatinine 0.80.  Glucose was elevated at 145.    ASSESSMENT/PLAN:  1.  Essential Tremor.  -no tremor noted today.  No med needed.  2.  Headache  -I have very little concern for temporal  arteritis.  Her sedimentation rate was 0.  Her headaches are getting markedly better compared to a few months ago.  Because she notes headache primarily with Valsalva maneuver, I wanted MRV but apparently insurance denied it.  She seems to have a lot of worry around headache and seems more bothered by the worry than headache itself.  Talked about  gabapentin and decided to try 100 mg tid for a month and see how she does.  Risks, benefits, side effects and alternative therapies were discussed.  The opportunity to ask questions was given and they were answered to the best of my ability.  The patient expressed understanding and willingness to follow the outlined treatment protocols.  3.  Generalized anxiety disorder  -Still on Valium.  I would like to see her on an alternative medication.  4.  F/u 3-4 months   Cc:  Street, [name], MD

## 2018-04-10 ENCOUNTER — Ambulatory Visit: Payer: Medicare HMO | Admitting: Neurology

## 2018-04-10 ENCOUNTER — Encounter: Payer: Self-pay | Admitting: Neurology

## 2018-04-10 VITALS — BP 140/66 | HR 62 | Ht 68.0 in | Wt 185.0 lb

## 2018-04-10 DIAGNOSIS — R51 Headache: Secondary | ICD-10-CM | POA: Diagnosis not present

## 2018-04-10 DIAGNOSIS — R519 Headache, unspecified: Secondary | ICD-10-CM

## 2018-04-10 MED ORDER — GABAPENTIN 100 MG PO CAPS: 100.0000 mg | ORAL_CAPSULE | Freq: Three times a day (TID) | ORAL | 0 refills | Status: DC

## 2018-04-10 NOTE — Patient Instructions (Addendum)
1.  Start gabapentin - 100 mg three times per day for a month.  We will stop it after a month UNLESS your headaches aren't gone 2.  Call me if headaches not gone in a month 3.  You will make a follow up with me in 3-4 months 4.  Good to see you!

## 2018-05-12 DIAGNOSIS — K219 Gastro-esophageal reflux disease without esophagitis: Secondary | ICD-10-CM | POA: Diagnosis not present

## 2018-05-12 DIAGNOSIS — I4891 Unspecified atrial fibrillation: Secondary | ICD-10-CM | POA: Diagnosis not present

## 2018-05-12 DIAGNOSIS — K2270 Barrett's esophagus without dysplasia: Secondary | ICD-10-CM | POA: Diagnosis not present

## 2018-05-12 DIAGNOSIS — I10 Essential (primary) hypertension: Secondary | ICD-10-CM | POA: Diagnosis not present

## 2018-05-13 ENCOUNTER — Encounter

## 2018-05-13 ENCOUNTER — Other Ambulatory Visit: Payer: Self-pay

## 2018-05-13 ENCOUNTER — Encounter: Payer: Self-pay | Admitting: Internal Medicine

## 2018-05-13 ENCOUNTER — Ambulatory Visit (AMBULATORY_SURGERY_CENTER): Payer: Medicare HMO | Admitting: Internal Medicine

## 2018-05-13 VITALS — BP 147/79 | HR 63 | Temp 98.4°F | Resp 14 | Ht 68.0 in | Wt 185.0 lb

## 2018-05-13 DIAGNOSIS — K219 Gastro-esophageal reflux disease without esophagitis: Secondary | ICD-10-CM

## 2018-05-13 DIAGNOSIS — K22719 Barrett's esophagus with dysplasia, unspecified: Secondary | ICD-10-CM

## 2018-05-13 DIAGNOSIS — K2270 Barrett's esophagus without dysplasia: Secondary | ICD-10-CM | POA: Diagnosis not present

## 2018-05-13 DIAGNOSIS — I4891 Unspecified atrial fibrillation: Secondary | ICD-10-CM | POA: Diagnosis not present

## 2018-05-13 DIAGNOSIS — I10 Essential (primary) hypertension: Secondary | ICD-10-CM | POA: Diagnosis not present

## 2018-05-13 MED ORDER — SODIUM CHLORIDE 0.9 % IV SOLN: 500.0000 mL | Freq: Once | INTRAVENOUS | Status: DC

## 2018-05-13 NOTE — Op Note (Signed)
[name] Patient Name: [name] Procedure Date: 05/13/2018 3:57 PM MRN: [id number] Endoscopist: Docia Chuck. Henrene Pastor , MD Age: 77 Referring MD:  Date of Birth: [birth year] Gender: Female Account #: [id number] Procedure:                Upper GI endoscopy Indications:              Surveillance for malignancy due to personal history                            of Barrett's esophagus Medicines:                Monitored Anesthesia Care Procedure:                Pre-Anesthesia Assessment:                           - Prior to the procedure, a History and Physical                            was performed, and patient medications and                            allergies were reviewed. The patient's tolerance of                            previous anesthesia was also reviewed. The risks                            and benefits of the procedure and the sedation                            options and risks were discussed with the patient.                            All questions were answered, and informed consent                            was obtained. Prior Anticoagulants: The patient has                            taken no previous anticoagulant or antiplatelet                            agents. ASA Grade Assessment: II - A patient with                            mild systemic disease. After reviewing the risks                            and benefits, the patient was deemed in                            satisfactory condition to undergo the procedure.  After obtaining informed consent, the endoscope was                            passed under direct vision. Throughout the                            procedure, the patient's blood pressure, pulse, and                            oxygen saturations were monitored continuously. The                            Model GIF-HQ190 [id number]) scope was introduced                            through the mouth, and advanced  to the second part                            of duodenum. The upper GI endoscopy was                            accomplished without difficulty. The patient                            tolerated the procedure well. Scope In: Scope Out: Findings:                 The esophagus and gastroesophageal junction were                            examined with white light and narrow band imaging                            (NBI) from a forward view and retroflexed position.                            There were esophageal mucosal changes consistent                            with short-segment Barrett's esophagus. These                            changes involved the mucosa extending to the Z-line                            (40 cm from the incisors). One tongue of                            salmon-colored mucosa was present at 40 cm and one                            tongue of salmon-colored mucosa was present from 40  to 40 cm. The maximum longitudinal extent of these                            esophageal mucosal changes was 0.5 cm in length.                            Mucosa was biopsied with a cold forceps for                            histology randomly. One specimen bottle was sent to                            pathology.                           The stomach was normal save multiple diminutive                            benign fundic gland type polyps all measuring less                            than 5 mm.                           The examined duodenum was normal.                           The cardia and gastric fundus were normal on                            retroflexion. Complications:            No immediate complications. Estimated Blood Loss:     Estimated blood loss: none. Impression:               - Esophageal mucosal changes consistent with                            short-segment Barrett's esophagus. Biopsied.                           - Normal stomach,  Save diminutive benign fundic                            gland polyps.                           - Normal examined duodenum. Recommendation:           1. Continue current medications                           2. Follow-up biopsies                           3. Repeat endoscopy in 3 years if nondysplastic  Barrett's                           4. Routine office follow-up one year. Docia Chuck. Henrene Pastor, MD 05/13/2018 4:21:58 PM This report has been signed electronically.

## 2018-05-13 NOTE — Patient Instructions (Signed)
YOU HAD AN ENDOSCOPIC PROCEDURE TODAY AT [facility]:   Refer to the procedure report that was given to you for any specific questions about what was found during the examination.  If the procedure report does not answer your questions, please call your gastroenterologist to clarify.  If you requested that your care partner not be given the details of your procedure findings, then the procedure report has been included in a sealed envelope for you to review at your convenience later.  YOU SHOULD EXPECT: Some feelings of bloating in the abdomen. Passage of more gas than usual.  Walking can help get rid of the air that was put into your GI tract during the procedure and reduce the bloating. If you had a lower endoscopy (such as a colonoscopy or flexible sigmoidoscopy) you may notice spotting of blood in your stool or on the toilet paper. If you underwent a bowel prep for your procedure, you may not have a normal bowel movement for a few days.  Please Note:  You might notice some irritation and congestion in your nose or some drainage.  This is from the oxygen used during your procedure.  There is no need for concern and it should clear up in a day or so.  SYMPTOMS TO REPORT IMMEDIATELY:   Following upper endoscopy (EGD)  Vomiting of blood or coffee ground material  New chest pain or pain under the shoulder blades  Painful or persistently difficult swallowing  New shortness of breath  Fever of 100F or higher  Black, tarry-looking stools  For urgent or emergent issues, a gastroenterologist can be reached at any hour by calling [phone].   DIET:  We do recommend a small meal at first, but then you may proceed to your regular diet.  Drink plenty of fluids but you should avoid alcoholic beverages for 24 hours.  MEDICATIONS: Continue current medications.  Please see handouts given to you by your recovery nurse.  Follow up with Dr. Henrene Pastor in his office in one year for routine  follow-up.  ACTIVITY:  You should plan to take it easy for the rest of today and you should NOT DRIVE or use heavy machinery until tomorrow (because of the sedation medicines used during the test).    FOLLOW UP: Our staff will call the number listed on your records the next business day following your procedure to check on you and address any questions or concerns that you may have regarding the information given to you following your procedure. If we do not reach you, we will leave a message.  However, if you are feeling well and you are not experiencing any problems, there is no need to return our call.  We will assume that you have returned to your regular daily activities without incident.  If any biopsies were taken you will be contacted by phone or by letter within the next 1-3 weeks.  Please call us at [phone] if you have not heard about the biopsies in 3 weeks.   Thank you for allowing [place] to provide for your healthcare needs today.   SIGNATURES/CONFIDENTIALITY: You and/or your care partner have signed paperwork which will be entered into your electronic medical record.  These signatures attest to the fact that that the information above on your After Visit Summary has been reviewed and is understood.  Full responsibility of the confidentiality of this discharge information lies with you and/or your care-partner.

## 2018-05-13 NOTE — Progress Notes (Signed)
Called to room to assist during endoscopic procedure.  Patient ID and intended procedure confirmed with present staff. Received instructions for my participation in the procedure from the performing physician.  

## 2018-05-13 NOTE — Progress Notes (Signed)
Report given to PACU, vss 

## 2018-05-14 ENCOUNTER — Telehealth: Payer: Self-pay

## 2018-05-14 NOTE — Telephone Encounter (Signed)
  Follow up Call-  Call back number 05/13/2018  Post procedure Call Back phone  # [phone]  Permission to leave phone message Yes  Some recent data might be hidden     Patient questions:  Do you have a fever, pain , or abdominal swelling? No. Pain Score  0 *  Have you tolerated food without any problems? Yes.    Have you been able to return to your normal activities? Yes.    Do you have any questions about your discharge instructions: Diet   No. Medications  No. Follow up visit  No.  Do you have questions or concerns about your Care? No.  Actions: * If pain score is 4 or above: No action needed, pain <4.

## 2018-05-18 DIAGNOSIS — J329 Chronic sinusitis, unspecified: Secondary | ICD-10-CM | POA: Diagnosis not present

## 2018-05-18 DIAGNOSIS — Z6827 Body mass index (BMI) 27.0-27.9, adult: Secondary | ICD-10-CM | POA: Diagnosis not present

## 2018-05-19 ENCOUNTER — Encounter: Payer: Self-pay | Admitting: Internal Medicine

## 2018-05-21 ENCOUNTER — Other Ambulatory Visit: Payer: Self-pay | Admitting: Obstetrics and Gynecology

## 2018-05-21 DIAGNOSIS — Z1231 Encounter for screening mammogram for malignant neoplasm of breast: Secondary | ICD-10-CM

## 2018-06-08 ENCOUNTER — Ambulatory Visit: Payer: Medicare HMO

## 2018-06-16 DIAGNOSIS — H40013 Open angle with borderline findings, low risk, bilateral: Secondary | ICD-10-CM | POA: Diagnosis not present

## 2018-06-16 DIAGNOSIS — H26493 Other secondary cataract, bilateral: Secondary | ICD-10-CM | POA: Diagnosis not present

## 2018-06-16 DIAGNOSIS — Z961 Presence of intraocular lens: Secondary | ICD-10-CM | POA: Diagnosis not present

## 2018-06-16 DIAGNOSIS — H04123 Dry eye syndrome of bilateral lacrimal glands: Secondary | ICD-10-CM | POA: Diagnosis not present

## 2018-06-26 ENCOUNTER — Ambulatory Visit: Payer: Medicare HMO

## 2018-07-14 ENCOUNTER — Ambulatory Visit
Admission: RE | Admit: 2018-07-14 | Discharge: 2018-07-14 | Disposition: A | Discharge status: Home / Self Care | Payer: Medicare HMO | Source: Ambulatory Visit | Attending: Obstetrics and Gynecology | Admitting: Obstetrics and Gynecology

## 2018-07-14 DIAGNOSIS — Z1231 Encounter for screening mammogram for malignant neoplasm of breast: Secondary | ICD-10-CM | POA: Diagnosis not present

## 2018-07-14 DIAGNOSIS — G8929 Other chronic pain: Secondary | ICD-10-CM | POA: Insufficient documentation

## 2018-07-14 DIAGNOSIS — M170 Bilateral primary osteoarthritis of knee: Secondary | ICD-10-CM | POA: Diagnosis not present

## 2018-07-14 DIAGNOSIS — M25562 Pain in left knee: Secondary | ICD-10-CM | POA: Diagnosis not present

## 2018-07-14 DIAGNOSIS — M25561 Pain in right knee: Secondary | ICD-10-CM | POA: Diagnosis not present

## 2018-07-28 IMAGING — MR MR HEAD W/O CM
10 series · 48 of 48 positions shown · non-contrast
Comparison: Head CT 02/07/2018

CLINICAL DATA: Left retro-orbital headache over the last several
weeks.

EXAM:
MRI HEAD WITHOUT CONTRAST
TECHNIQUE: Multiplanar, multiecho pulse sequences of the brain and surrounding
structures were obtained without intravenous contrast.

[Series 5: T1 · sagittal · 4.0mm · 0.75mm/px · 2 of 31 slices shown (1 of 2)]
[im 1/31]
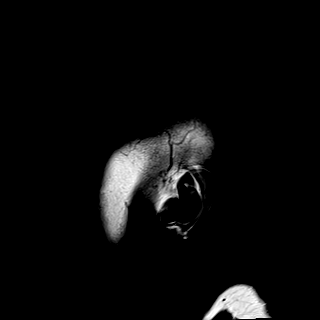
[im 31/31]
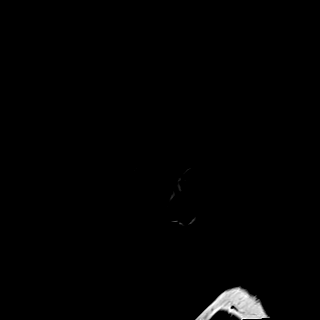

[Series 6: T2 · coronal · 4.5mm · 0.36mm/px · 3 of 30 slices shown (1 of 2)]
[im 1/30]
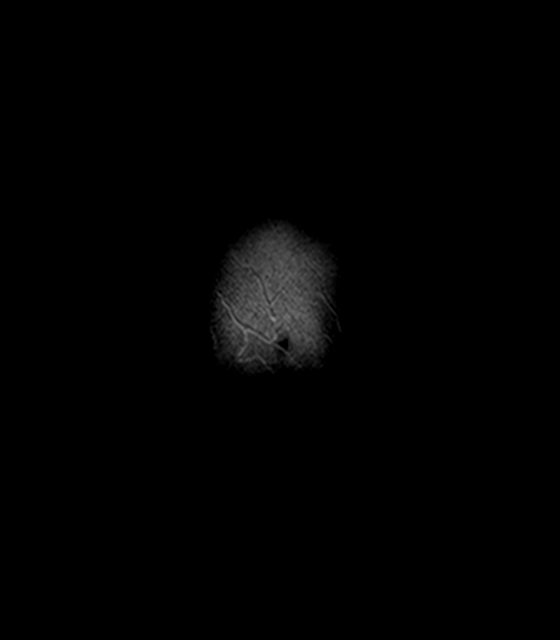
[im 15/30]
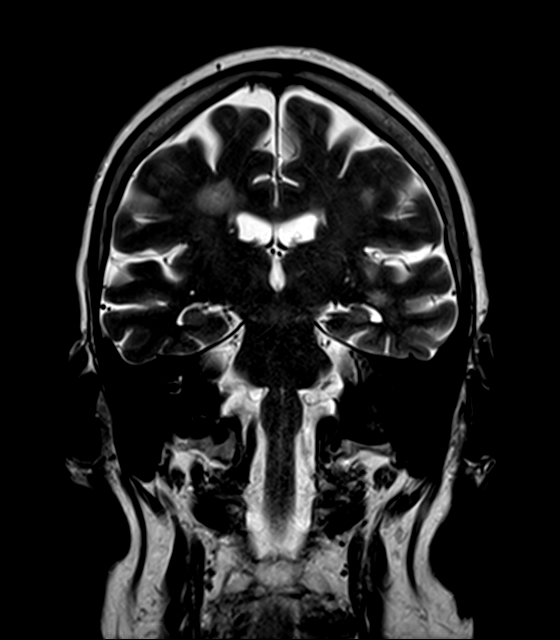
[im 30/30]
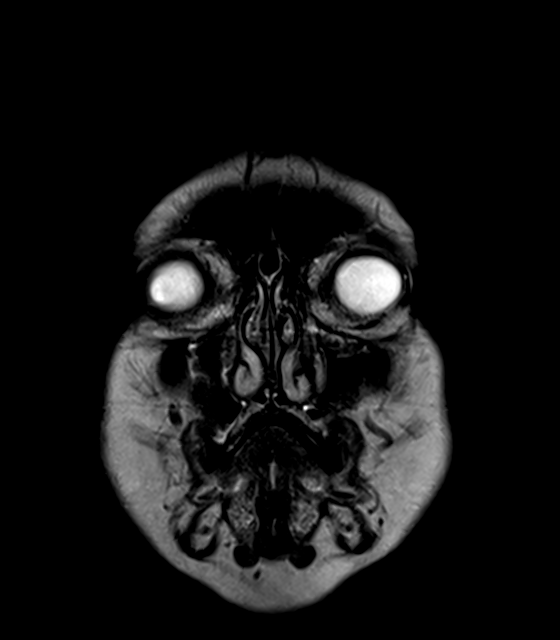

[Series 7: DWI · coronal · 5.0mm · 1.44mm/px · 5 of 60 slices shown (1 of 4)]
[im 1/60]
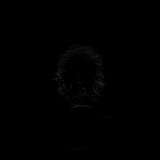
[im 15/60]
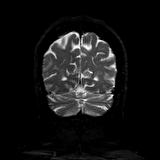
[im 30/60]
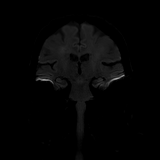
[im 45/60]
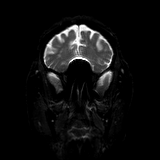
[im 60/60]
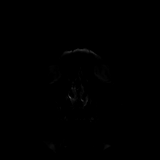

[Series 8: DWI · coronal · 5.0mm · 1.44mm/px · 3 of 30 slices shown (2 of 4)]
[im 1/30]
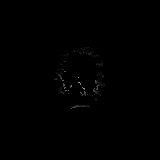
[im 15/30]
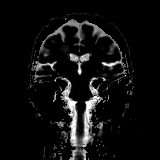
[im 30/30]
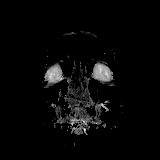

[Series 9: DWI · axial · 3.0mm · 1.44mm/px · z∈[-37,+95]mm · 7 of 84 slices shown (3 of 4)]
[im 1/84]
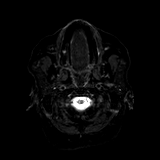
[im 14/84]
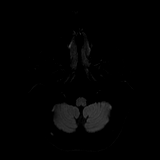
[im 28/84]
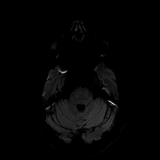
[im 42/84]
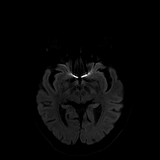
[im 56/84]
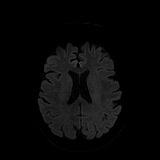
[im 70/84]
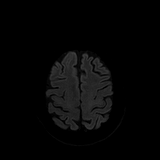
[im 84/84]
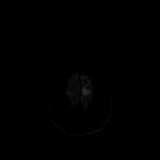

[Series 10: DWI · axial · 3.0mm · 1.44mm/px · z∈[-37,+95]mm · 4 of 42 slices shown (4 of 4)]
[im 1/42]
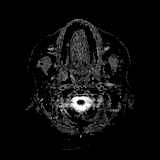
[im 14/42]
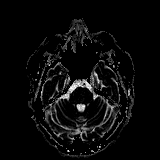
[im 28/42]
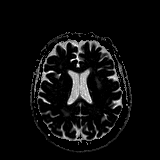
[im 42/42]
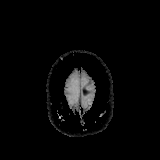

[Series 11: T2 · axial · 4.0mm · 0.36mm/px · z∈[-37,+96]mm · 2 of 27 slices shown (2 of 2)]
[im 1/27]
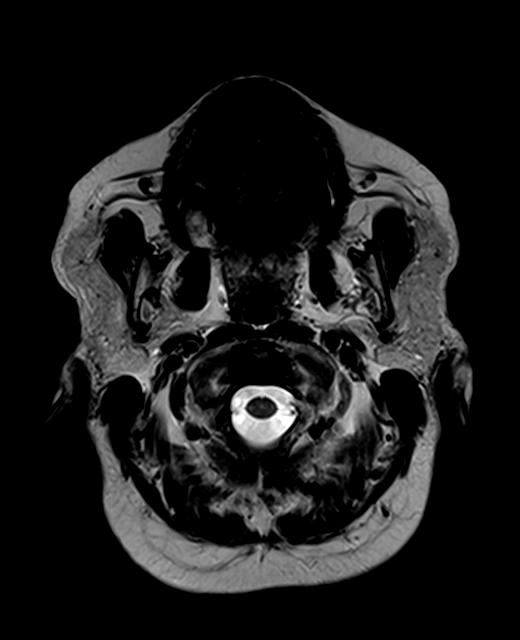
[im 27/27]
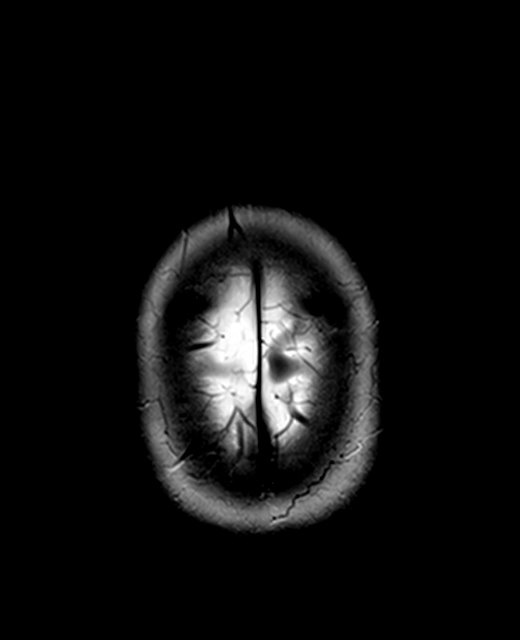

[Series 12: FLAIR · axial · 3.0mm · 0.72mm/px · z∈[-44,+103]mm · 2 of 26 slices shown]
[im 1/26]
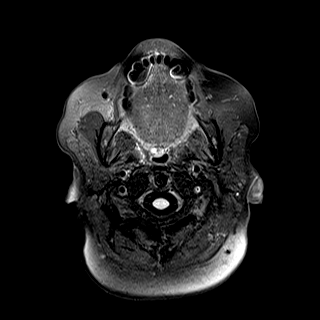
[im 26/26]
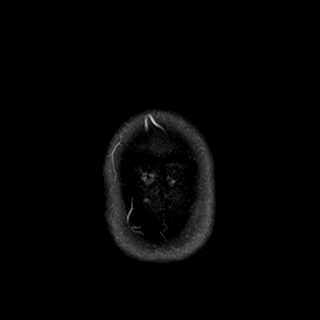

[Series 14: swi_images · axial · 1.5mm · 0.90mm/px · z∈[-40,+99]mm · 8 of 96 slices shown]
[im 1/96]
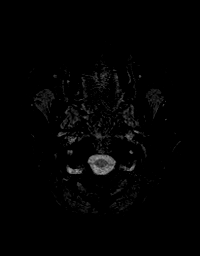
[im 14/96]
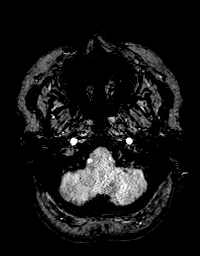
[im 28/96]
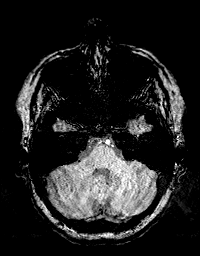
[im 41/96]
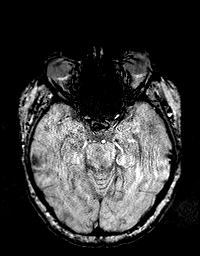
[im 55/96]
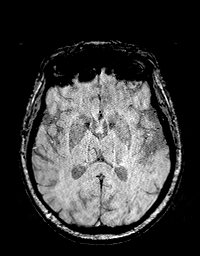
[im 68/96]
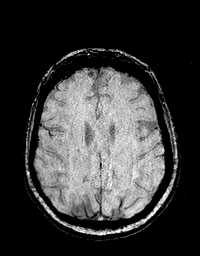
[im 82/96]
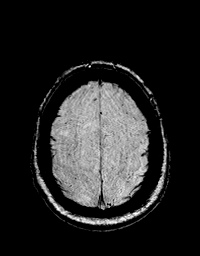
[im 96/96]
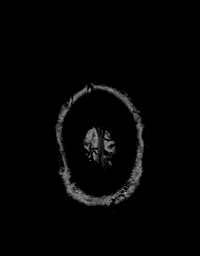

[Series 15: T1 · axial · 1.0mm · 0.90mm/px · z∈[-40,+100]mm · 12 of 144 slices shown (2 of 2)]
[im 1/144]
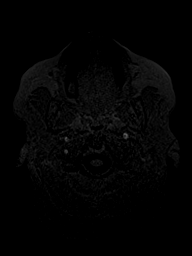
[im 14/144]
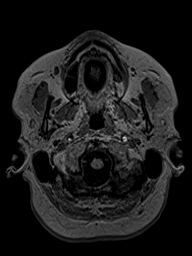
[im 27/144]
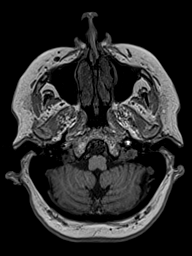
[im 40/144]
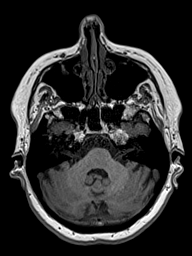
[im 53/144]
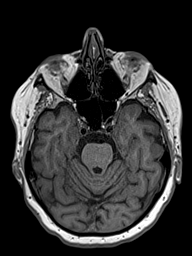
[im 66/144]
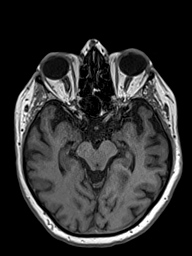
[im 79/144]
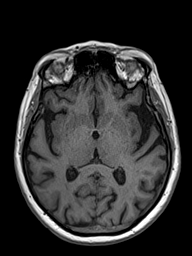
[im 92/144]
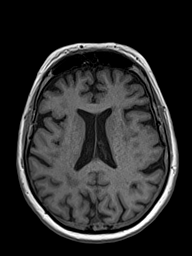
[im 105/144]
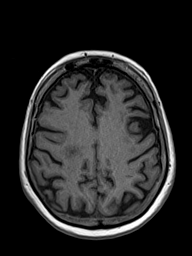
[im 118/144]
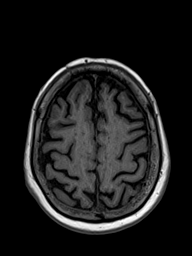
[im 131/144]
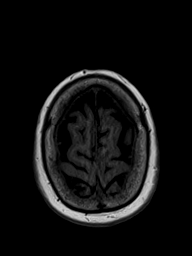
[im 144/144]
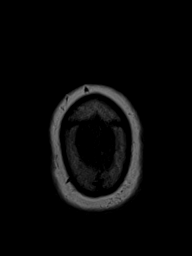

[48 of 48 positions shown; findings below may reference images not displayed]

FINDINGS: Brain: Diffusion imaging does not show any acute or subacute
infarction. There are mild chronic small-vessel changes of the pons.
No focal cerebellar finding. Cerebral hemispheres show moderate
changes of small vessel disease affecting the deep and subcortical
white matter. No cortical or large vessel territory infarction. No
mass lesion, hydrocephalus or extra-axial collection. There are
scattered foci of hemosiderin deposition associated with some of the
old small vessel infarctions.

Vascular: Major vessels at the base of the brain show flow.

Skull and upper cervical spine: Negative

Sinuses/Orbits: Sinuses are clear. Orbits appear normal. Orbital
apices are normal. Cavernous sinus regions are normal.

Other: None
IMPRESSION: No cause of the left-sided pain is identified. Moderate chronic
small-vessel ischemic changes as outlined above.

## 2018-07-30 DIAGNOSIS — G8929 Other chronic pain: Secondary | ICD-10-CM | POA: Diagnosis not present

## 2018-07-30 DIAGNOSIS — M170 Bilateral primary osteoarthritis of knee: Secondary | ICD-10-CM | POA: Diagnosis not present

## 2018-08-12 DIAGNOSIS — L900 Lichen sclerosus et atrophicus: Secondary | ICD-10-CM | POA: Diagnosis not present

## 2018-08-14 ENCOUNTER — Ambulatory Visit: Payer: Medicare HMO | Admitting: Neurology

## 2018-09-08 DIAGNOSIS — M170 Bilateral primary osteoarthritis of knee: Secondary | ICD-10-CM | POA: Diagnosis not present

## 2018-09-08 DIAGNOSIS — G8929 Other chronic pain: Secondary | ICD-10-CM | POA: Diagnosis not present

## 2018-09-13 DIAGNOSIS — I10 Essential (primary) hypertension: Secondary | ICD-10-CM | POA: Diagnosis not present

## 2018-09-13 DIAGNOSIS — H6621 Chronic atticoantral suppurative otitis media, right ear: Secondary | ICD-10-CM | POA: Diagnosis not present

## 2018-09-13 DIAGNOSIS — J069 Acute upper respiratory infection, unspecified: Secondary | ICD-10-CM | POA: Diagnosis not present

## 2018-09-14 DIAGNOSIS — R739 Hyperglycemia, unspecified: Secondary | ICD-10-CM | POA: Diagnosis not present

## 2018-09-14 DIAGNOSIS — I10 Essential (primary) hypertension: Secondary | ICD-10-CM | POA: Diagnosis not present

## 2018-09-14 DIAGNOSIS — E785 Hyperlipidemia, unspecified: Secondary | ICD-10-CM | POA: Diagnosis not present

## 2018-09-14 DIAGNOSIS — J329 Chronic sinusitis, unspecified: Secondary | ICD-10-CM | POA: Diagnosis not present

## 2018-09-14 DIAGNOSIS — Z79899 Other long term (current) drug therapy: Secondary | ICD-10-CM | POA: Diagnosis not present

## 2018-09-14 DIAGNOSIS — E782 Mixed hyperlipidemia: Secondary | ICD-10-CM | POA: Diagnosis not present

## 2018-09-14 DIAGNOSIS — E559 Vitamin D deficiency, unspecified: Secondary | ICD-10-CM | POA: Diagnosis not present

## 2018-09-14 DIAGNOSIS — I480 Paroxysmal atrial fibrillation: Secondary | ICD-10-CM | POA: Diagnosis not present

## 2018-09-14 DIAGNOSIS — G250 Essential tremor: Secondary | ICD-10-CM | POA: Diagnosis not present

## 2018-09-18 DIAGNOSIS — Z2821 Immunization not carried out because of patient refusal: Secondary | ICD-10-CM | POA: Diagnosis not present

## 2018-09-18 DIAGNOSIS — J00 Acute nasopharyngitis [common cold]: Secondary | ICD-10-CM | POA: Diagnosis not present

## 2018-09-18 DIAGNOSIS — Z6826 Body mass index (BMI) 26.0-26.9, adult: Secondary | ICD-10-CM | POA: Diagnosis not present

## 2018-09-18 DIAGNOSIS — K529 Noninfective gastroenteritis and colitis, unspecified: Secondary | ICD-10-CM | POA: Diagnosis not present

## 2018-09-18 DIAGNOSIS — Z23 Encounter for immunization: Secondary | ICD-10-CM | POA: Diagnosis not present

## 2018-10-05 DIAGNOSIS — K219 Gastro-esophageal reflux disease without esophagitis: Secondary | ICD-10-CM | POA: Diagnosis not present

## 2018-10-05 DIAGNOSIS — Z6826 Body mass index (BMI) 26.0-26.9, adult: Secondary | ICD-10-CM | POA: Diagnosis not present

## 2018-10-05 DIAGNOSIS — J310 Chronic rhinitis: Secondary | ICD-10-CM | POA: Diagnosis not present

## 2018-10-22 DIAGNOSIS — M170 Bilateral primary osteoarthritis of knee: Secondary | ICD-10-CM | POA: Diagnosis not present

## 2018-10-22 DIAGNOSIS — G8929 Other chronic pain: Secondary | ICD-10-CM | POA: Diagnosis not present

## 2018-10-30 DIAGNOSIS — M25562 Pain in left knee: Secondary | ICD-10-CM | POA: Diagnosis not present

## 2018-11-03 DIAGNOSIS — M1712 Unilateral primary osteoarthritis, left knee: Secondary | ICD-10-CM | POA: Diagnosis not present

## 2018-11-03 DIAGNOSIS — G8929 Other chronic pain: Secondary | ICD-10-CM | POA: Diagnosis not present

## 2018-11-03 DIAGNOSIS — S83242A Other tear of medial meniscus, current injury, left knee, initial encounter: Secondary | ICD-10-CM | POA: Diagnosis not present

## 2018-11-18 DIAGNOSIS — M6752 Plica syndrome, left knee: Secondary | ICD-10-CM | POA: Diagnosis not present

## 2018-11-18 DIAGNOSIS — S83232A Complex tear of medial meniscus, current injury, left knee, initial encounter: Secondary | ICD-10-CM | POA: Diagnosis not present

## 2018-11-18 DIAGNOSIS — M659 Synovitis and tenosynovitis, unspecified: Secondary | ICD-10-CM | POA: Diagnosis not present

## 2018-11-18 DIAGNOSIS — G8918 Other acute postprocedural pain: Secondary | ICD-10-CM | POA: Diagnosis not present

## 2018-11-18 DIAGNOSIS — M94262 Chondromalacia, left knee: Secondary | ICD-10-CM | POA: Diagnosis not present

## 2018-11-18 DIAGNOSIS — S83272A Complex tear of lateral meniscus, current injury, left knee, initial encounter: Secondary | ICD-10-CM | POA: Diagnosis not present

## 2018-11-24 DIAGNOSIS — Z9889 Other specified postprocedural states: Secondary | ICD-10-CM | POA: Insufficient documentation

## 2018-11-25 DIAGNOSIS — M25562 Pain in left knee: Secondary | ICD-10-CM | POA: Diagnosis not present

## 2018-11-25 DIAGNOSIS — S83232A Complex tear of medial meniscus, current injury, left knee, initial encounter: Secondary | ICD-10-CM | POA: Diagnosis not present

## 2018-11-25 DIAGNOSIS — R2689 Other abnormalities of gait and mobility: Secondary | ICD-10-CM | POA: Diagnosis not present

## 2018-11-25 DIAGNOSIS — M25462 Effusion, left knee: Secondary | ICD-10-CM | POA: Diagnosis not present

## 2018-11-25 DIAGNOSIS — M62552 Muscle wasting and atrophy, not elsewhere classified, left thigh: Secondary | ICD-10-CM | POA: Diagnosis not present

## 2018-11-27 DIAGNOSIS — M25562 Pain in left knee: Secondary | ICD-10-CM | POA: Diagnosis not present

## 2018-11-27 DIAGNOSIS — R2689 Other abnormalities of gait and mobility: Secondary | ICD-10-CM | POA: Diagnosis not present

## 2018-11-27 DIAGNOSIS — S83232A Complex tear of medial meniscus, current injury, left knee, initial encounter: Secondary | ICD-10-CM | POA: Diagnosis not present

## 2018-11-27 DIAGNOSIS — M25462 Effusion, left knee: Secondary | ICD-10-CM | POA: Diagnosis not present

## 2018-11-27 DIAGNOSIS — M62552 Muscle wasting and atrophy, not elsewhere classified, left thigh: Secondary | ICD-10-CM | POA: Diagnosis not present

## 2018-11-30 DIAGNOSIS — R2689 Other abnormalities of gait and mobility: Secondary | ICD-10-CM | POA: Diagnosis not present

## 2018-11-30 DIAGNOSIS — S83232A Complex tear of medial meniscus, current injury, left knee, initial encounter: Secondary | ICD-10-CM | POA: Diagnosis not present

## 2018-11-30 DIAGNOSIS — M25462 Effusion, left knee: Secondary | ICD-10-CM | POA: Diagnosis not present

## 2018-11-30 DIAGNOSIS — M25562 Pain in left knee: Secondary | ICD-10-CM | POA: Diagnosis not present

## 2018-11-30 DIAGNOSIS — M62552 Muscle wasting and atrophy, not elsewhere classified, left thigh: Secondary | ICD-10-CM | POA: Diagnosis not present

## 2018-12-01 DIAGNOSIS — S336XXA Sprain of sacroiliac joint, initial encounter: Secondary | ICD-10-CM | POA: Diagnosis not present

## 2018-12-01 DIAGNOSIS — M9903 Segmental and somatic dysfunction of lumbar region: Secondary | ICD-10-CM | POA: Diagnosis not present

## 2018-12-01 DIAGNOSIS — M9902 Segmental and somatic dysfunction of thoracic region: Secondary | ICD-10-CM | POA: Diagnosis not present

## 2018-12-01 DIAGNOSIS — S39012A Strain of muscle, fascia and tendon of lower back, initial encounter: Secondary | ICD-10-CM | POA: Diagnosis not present

## 2018-12-01 DIAGNOSIS — M9905 Segmental and somatic dysfunction of pelvic region: Secondary | ICD-10-CM | POA: Diagnosis not present

## 2018-12-07 DIAGNOSIS — S336XXA Sprain of sacroiliac joint, initial encounter: Secondary | ICD-10-CM | POA: Diagnosis not present

## 2018-12-07 DIAGNOSIS — M9903 Segmental and somatic dysfunction of lumbar region: Secondary | ICD-10-CM | POA: Diagnosis not present

## 2018-12-07 DIAGNOSIS — S39012A Strain of muscle, fascia and tendon of lower back, initial encounter: Secondary | ICD-10-CM | POA: Diagnosis not present

## 2018-12-07 DIAGNOSIS — M9902 Segmental and somatic dysfunction of thoracic region: Secondary | ICD-10-CM | POA: Diagnosis not present

## 2018-12-07 DIAGNOSIS — M9905 Segmental and somatic dysfunction of pelvic region: Secondary | ICD-10-CM | POA: Diagnosis not present

## 2018-12-10 DIAGNOSIS — M9905 Segmental and somatic dysfunction of pelvic region: Secondary | ICD-10-CM | POA: Diagnosis not present

## 2018-12-10 DIAGNOSIS — M9902 Segmental and somatic dysfunction of thoracic region: Secondary | ICD-10-CM | POA: Diagnosis not present

## 2018-12-10 DIAGNOSIS — M9903 Segmental and somatic dysfunction of lumbar region: Secondary | ICD-10-CM | POA: Diagnosis not present

## 2018-12-10 DIAGNOSIS — S336XXA Sprain of sacroiliac joint, initial encounter: Secondary | ICD-10-CM | POA: Diagnosis not present

## 2018-12-10 DIAGNOSIS — S39012A Strain of muscle, fascia and tendon of lower back, initial encounter: Secondary | ICD-10-CM | POA: Diagnosis not present

## 2018-12-14 DIAGNOSIS — M9902 Segmental and somatic dysfunction of thoracic region: Secondary | ICD-10-CM | POA: Diagnosis not present

## 2018-12-14 DIAGNOSIS — M9905 Segmental and somatic dysfunction of pelvic region: Secondary | ICD-10-CM | POA: Diagnosis not present

## 2018-12-14 DIAGNOSIS — M9903 Segmental and somatic dysfunction of lumbar region: Secondary | ICD-10-CM | POA: Diagnosis not present

## 2018-12-14 DIAGNOSIS — S39012A Strain of muscle, fascia and tendon of lower back, initial encounter: Secondary | ICD-10-CM | POA: Diagnosis not present

## 2018-12-14 DIAGNOSIS — S336XXA Sprain of sacroiliac joint, initial encounter: Secondary | ICD-10-CM | POA: Diagnosis not present

## 2018-12-17 DIAGNOSIS — M9903 Segmental and somatic dysfunction of lumbar region: Secondary | ICD-10-CM | POA: Diagnosis not present

## 2018-12-17 DIAGNOSIS — S336XXA Sprain of sacroiliac joint, initial encounter: Secondary | ICD-10-CM | POA: Diagnosis not present

## 2018-12-17 DIAGNOSIS — M9902 Segmental and somatic dysfunction of thoracic region: Secondary | ICD-10-CM | POA: Diagnosis not present

## 2018-12-17 DIAGNOSIS — S39012A Strain of muscle, fascia and tendon of lower back, initial encounter: Secondary | ICD-10-CM | POA: Diagnosis not present

## 2018-12-17 DIAGNOSIS — M9905 Segmental and somatic dysfunction of pelvic region: Secondary | ICD-10-CM | POA: Diagnosis not present

## 2018-12-22 DIAGNOSIS — M9902 Segmental and somatic dysfunction of thoracic region: Secondary | ICD-10-CM | POA: Diagnosis not present

## 2018-12-22 DIAGNOSIS — S336XXA Sprain of sacroiliac joint, initial encounter: Secondary | ICD-10-CM | POA: Diagnosis not present

## 2018-12-22 DIAGNOSIS — M9905 Segmental and somatic dysfunction of pelvic region: Secondary | ICD-10-CM | POA: Diagnosis not present

## 2018-12-22 DIAGNOSIS — M9903 Segmental and somatic dysfunction of lumbar region: Secondary | ICD-10-CM | POA: Diagnosis not present

## 2018-12-22 DIAGNOSIS — S39012A Strain of muscle, fascia and tendon of lower back, initial encounter: Secondary | ICD-10-CM | POA: Diagnosis not present

## 2019-01-05 DIAGNOSIS — H40013 Open angle with borderline findings, low risk, bilateral: Secondary | ICD-10-CM | POA: Diagnosis not present

## 2019-01-05 DIAGNOSIS — H04123 Dry eye syndrome of bilateral lacrimal glands: Secondary | ICD-10-CM | POA: Diagnosis not present

## 2019-01-05 DIAGNOSIS — H1852 Epithelial (juvenile) corneal dystrophy: Secondary | ICD-10-CM | POA: Diagnosis not present

## 2019-03-23 DIAGNOSIS — Z9889 Other specified postprocedural states: Secondary | ICD-10-CM | POA: Diagnosis not present

## 2019-03-23 DIAGNOSIS — M7052 Other bursitis of knee, left knee: Secondary | ICD-10-CM | POA: Diagnosis not present

## 2019-03-30 DIAGNOSIS — I959 Hypotension, unspecified: Secondary | ICD-10-CM | POA: Diagnosis not present

## 2019-03-30 DIAGNOSIS — Z6825 Body mass index (BMI) 25.0-25.9, adult: Secondary | ICD-10-CM | POA: Diagnosis not present

## 2019-03-30 DIAGNOSIS — I10 Essential (primary) hypertension: Secondary | ICD-10-CM | POA: Diagnosis not present

## 2019-03-30 DIAGNOSIS — I480 Paroxysmal atrial fibrillation: Secondary | ICD-10-CM | POA: Diagnosis not present

## 2019-03-30 DIAGNOSIS — F411 Generalized anxiety disorder: Secondary | ICD-10-CM | POA: Diagnosis not present

## 2019-03-30 DIAGNOSIS — E663 Overweight: Secondary | ICD-10-CM | POA: Diagnosis not present

## 2019-04-13 DIAGNOSIS — Z9889 Other specified postprocedural states: Secondary | ICD-10-CM | POA: Diagnosis not present

## 2019-06-01 ENCOUNTER — Encounter: Payer: Self-pay | Admitting: Internal Medicine

## 2019-06-07 DIAGNOSIS — N9089 Other specified noninflammatory disorders of vulva and perineum: Secondary | ICD-10-CM | POA: Diagnosis not present

## 2019-06-07 DIAGNOSIS — Z01419 Encounter for gynecological examination (general) (routine) without abnormal findings: Secondary | ICD-10-CM | POA: Diagnosis not present

## 2019-06-07 DIAGNOSIS — L900 Lichen sclerosus et atrophicus: Secondary | ICD-10-CM | POA: Diagnosis not present

## 2019-06-15 DIAGNOSIS — Z9889 Other specified postprocedural states: Secondary | ICD-10-CM | POA: Diagnosis not present

## 2019-06-16 DIAGNOSIS — N9089 Other specified noninflammatory disorders of vulva and perineum: Secondary | ICD-10-CM | POA: Diagnosis not present

## 2019-06-16 DIAGNOSIS — C519 Malignant neoplasm of vulva, unspecified: Secondary | ICD-10-CM | POA: Diagnosis not present

## 2019-06-24 DIAGNOSIS — H04123 Dry eye syndrome of bilateral lacrimal glands: Secondary | ICD-10-CM | POA: Diagnosis not present

## 2019-06-24 DIAGNOSIS — Z961 Presence of intraocular lens: Secondary | ICD-10-CM | POA: Diagnosis not present

## 2019-06-24 DIAGNOSIS — H40013 Open angle with borderline findings, low risk, bilateral: Secondary | ICD-10-CM | POA: Diagnosis not present

## 2019-06-24 DIAGNOSIS — M25561 Pain in right knee: Secondary | ICD-10-CM | POA: Diagnosis not present

## 2019-06-24 DIAGNOSIS — H35033 Hypertensive retinopathy, bilateral: Secondary | ICD-10-CM | POA: Diagnosis not present

## 2019-07-07 DIAGNOSIS — C519 Malignant neoplasm of vulva, unspecified: Secondary | ICD-10-CM | POA: Diagnosis not present

## 2019-07-21 ENCOUNTER — Encounter: Payer: Self-pay | Admitting: Internal Medicine

## 2019-07-21 ENCOUNTER — Ambulatory Visit: Payer: Medicare HMO | Admitting: Internal Medicine

## 2019-07-21 VITALS — BP 140/80 | HR 79 | Temp 97.8°F | Ht 68.0 in | Wt 170.0 lb

## 2019-07-21 DIAGNOSIS — Z1231 Encounter for screening mammogram for malignant neoplasm of breast: Secondary | ICD-10-CM | POA: Diagnosis not present

## 2019-07-21 DIAGNOSIS — Z23 Encounter for immunization: Secondary | ICD-10-CM | POA: Diagnosis not present

## 2019-07-21 DIAGNOSIS — K219 Gastro-esophageal reflux disease without esophagitis: Secondary | ICD-10-CM | POA: Diagnosis not present

## 2019-07-21 DIAGNOSIS — K22719 Barrett's esophagus with dysplasia, unspecified: Secondary | ICD-10-CM | POA: Diagnosis not present

## 2019-07-21 DIAGNOSIS — R140 Abdominal distension (gaseous): Secondary | ICD-10-CM

## 2019-07-21 MED ORDER — PANTOPRAZOLE SODIUM 40 MG PO TBEC: 40.0000 mg | DELAYED_RELEASE_TABLET | Freq: Every day | ORAL | 3 refills | Status: DC

## 2019-07-21 NOTE — Patient Instructions (Signed)
We have sent the following medications to your pharmacy for you to pick up at your convenience:  Protonix  Please follow up in one year 

## 2019-07-21 NOTE — Progress Notes (Signed)
HISTORY OF PRESENT ILLNESS:  [name] is a 78 y.o. female with GERD and previously documented short segment Barrett's esophagus who presents today for follow-up.  She was initially evaluated in this office March 30, 2018.  She has seen multiple previous gastroenterologists as noted.  Please see that dictation.  On May 13, 2018 she underwent upper endoscopy.  She had mucosal changes in the distal esophagus consistent with ultrashort Barrett's esophagus.  However, biopsies revealed squamous and cardia type mucosa with mild reflux changes but no intestinal metaplasia or dysplasia.  Since that time she has continued on pantoprazole 40 mg daily.  On medication she has no reflux symptoms.  She denies dysphasia.  No active GI complaints.  She does continue with intermittent abdominal bloating which she attributes to dietary indiscretion.  We did recommend a course of probiotic previously, which she thinks helped.  She will use probiotics occasionally.  She questions the need for routine screening colonoscopy.  She has reported multiple prior colonoscopies (last one within the past decade) with no polyps.  There is no family history.  She also questions the need for follow-up upper endoscopy at this time.  Review of x-ray and laboratory file shows no interval relevant studies or abnormalities.  GI review of systems is otherwise negative or normal  REVIEW OF SYSTEMS:  All non-GI ROS negative unless otherwise stated in the HPI except for arthritis, weakness in the legs (which is being evaluated elsewhere)  Past Medical History:  Diagnosis Date  . Abnormal EKG 01/23/2016  . Anxiety   . Atrial fibrillation ([place])   . Barrett's esophagus   . GERD (gastroesophageal reflux disease)   . Hypertension   . Spinal stenosis   . Vaginal delivery 1964  . Weakness     Past Surgical History:  Procedure Laterality Date  . CATARACT EXTRACTION, BILATERAL     august/september 2017  . CERVICAL POLYPECTOMY N/A  08/14/2015   Procedure: CERVICAL POLYPECTOMY;  Surgeon: Newton Pigg, MD;  Location: [facility];  Service: Gynecology;  Laterality: N/A;  . CHOLECYSTECTOMY    . HEMORROIDECTOMY    . HYSTEROSCOPY W/D&C N/A 08/14/2015   Procedure: DILATATION AND CURETTAGE /HYSTEROSCOPY;  Surgeon: Newton Pigg, MD;  Location: [facility];  Service: Gynecology;  Laterality: N/A;  . PORTACATH PLACEMENT      Social History NIKYAH REEP  reports that she has never smoked. She has never used smokeless tobacco. She reports that she does not drink alcohol or use drugs.  family history includes Alzheimer's disease in her brother; CVA in her mother; Heart attack in her brother and mother; Heart disease in her brother and mother; Lymphoma in her father.  Allergies  Allergen Reactions  . Compazine [Prochlorperazine Edisylate] Hives  . Cortisone   . Eliquis [Apixaban] Itching and Nausea Only  . Prednisone Nausea Only  . Prochlorperazine Other (See Comments)    unknown       PHYSICAL EXAMINATION: Vital signs: BP 140/80   Pulse 79   Temp 97.8 F (36.6 C)   Ht 5\' 8"  (1.727 m)   Wt 170 lb (77.1 kg)   BMI 25.85 kg/m   Constitutional: generally well-appearing, no acute distress Psychiatric: alert and oriented x3, cooperative Eyes: extraocular movements intact, anicteric, conjunctiva pink Mouth: oral pharynx moist, no lesions Neck: supple no lymphadenopathy Cardiovascular: heart regular rate and rhythm, no murmur Lungs: clear to auscultation bilaterally Abdomen: soft, nontender, nondistended, no obvious ascites, no peritoneal signs, normal bowel sounds, no organomegaly Rectal: Omitted Extremities:  no clubbing, cyanosis, or lower extremity edema bilaterally Skin: no lesions on visible extremities Neuro: No focal deficits. No asterixis.    ASSESSMENT:  1.  GERD.  Symptoms controlled with PPI. 2.  History of nondysplastic Barrett's elsewhere.  Last examination with ultrashort segment of columnar type mucosa  with NO Barrett's. 3.  Chronic bloating.  Stable 4.  Colon cancer screening.  Multiple prior colonoscopies elsewhere negative for neoplasia.  Aged out  PLAN:  1.  Reflux precautions with attention to weight loss 2.  Continue pantoprazole 40 mg daily.  Prescription refilled.  Medication risks reviewed 3.  Anti-gas and flatulence dietary sheet 4.  No indication for surveillance EGD 5.  Routine GI follow-up in this office 1 year.  Contact the office in the interim for any questions or problems. 6.  Resume general medical care with your PCP Dr. Venetia Maxon

## 2019-07-26 DIAGNOSIS — C9590 Leukemia, unspecified not having achieved remission: Secondary | ICD-10-CM | POA: Insufficient documentation

## 2019-07-26 DIAGNOSIS — M4180 Other forms of scoliosis, site unspecified: Secondary | ICD-10-CM | POA: Diagnosis not present

## 2019-07-26 DIAGNOSIS — C4491 Basal cell carcinoma of skin, unspecified: Secondary | ICD-10-CM | POA: Insufficient documentation

## 2019-07-26 DIAGNOSIS — M48061 Spinal stenosis, lumbar region without neurogenic claudication: Secondary | ICD-10-CM | POA: Diagnosis not present

## 2019-07-26 DIAGNOSIS — M4150 Other secondary scoliosis, site unspecified: Secondary | ICD-10-CM | POA: Insufficient documentation

## 2019-07-26 DIAGNOSIS — M545 Low back pain: Secondary | ICD-10-CM | POA: Diagnosis not present

## 2019-08-04 DIAGNOSIS — M48061 Spinal stenosis, lumbar region without neurogenic claudication: Secondary | ICD-10-CM | POA: Diagnosis not present

## 2019-08-11 DIAGNOSIS — M48062 Spinal stenosis, lumbar region with neurogenic claudication: Secondary | ICD-10-CM | POA: Diagnosis not present

## 2019-08-11 DIAGNOSIS — M5136 Other intervertebral disc degeneration, lumbar region: Secondary | ICD-10-CM | POA: Insufficient documentation

## 2019-09-08 DIAGNOSIS — M48062 Spinal stenosis, lumbar region with neurogenic claudication: Secondary | ICD-10-CM | POA: Diagnosis not present

## 2019-09-30 ENCOUNTER — Other Ambulatory Visit: Payer: Self-pay

## 2019-09-30 ENCOUNTER — Ambulatory Visit (INDEPENDENT_AMBULATORY_CARE_PROVIDER_SITE_OTHER): Payer: Medicare HMO | Admitting: Neurology

## 2019-09-30 ENCOUNTER — Encounter: Payer: Self-pay | Admitting: Neurology

## 2019-09-30 VITALS — BP 152/90 | HR 72 | Temp 96.9°F | Ht 67.5 in | Wt 178.0 lb

## 2019-09-30 DIAGNOSIS — M48062 Spinal stenosis, lumbar region with neurogenic claudication: Secondary | ICD-10-CM | POA: Diagnosis not present

## 2019-09-30 DIAGNOSIS — R251 Tremor, unspecified: Secondary | ICD-10-CM | POA: Diagnosis not present

## 2019-09-30 DIAGNOSIS — R29898 Other symptoms and signs involving the musculoskeletal system: Secondary | ICD-10-CM

## 2019-09-30 NOTE — Progress Notes (Addendum)
[facility]    Provider:  Dr Jaynee Eagles Requesting Provider: Dr. Melina Schools Primary Care Provider:  Street, [name], MD  CC:  Leg weakness  HPI:  [name] is a 78 y.o. female here as requested by Melina Schools for leg weakness. Patient has a PMHx of spinal stenosis, spinal scoliosis, afib, leukemia, gerd.  I reviewed Dr. Rolena Infante notes.  Patient has had leg pain for years.  The right is worse than the left.  She denies any significant back pain.  She feels weak when she walks long distances.  She also feels her balance is poor.  The pain is constant, 5 out of 10, ongoing for 10 years continuously, helps to sit and rest, worsens when walking, also associated with weakness of the bilateral lower legs, previously seen at neurosurgery where she had 2 to 3 injections after Dr. Vertell Limber sent her to Dr. Maryjean Ka.  Her examination was largely unremarkable, he did notice that she had a slightly ataxic gait pattern due to her lower extremity weakness primarily below the knees, other than that strength appeared to be 5 out of 5, no clonus, reflexes 1+ and symmetric, negative Babinski, no significant back pain with palpation or range of motion, no significant lower extremity pain with hip knee and range of motion in the ankles.  A sensation of weakness in the knee and foot and ankle region subjectively by patient.  X-rays of the lumbar spine demonstrated degenerative scoliosis with multilevel degenerative disc disease.  No falls.  Clinical exam demonstrates no focal motor deficits but globally she does feel slightly ataxic and has generalized weakness in the legs subjectively.  Imaging studies demonstrated stenosis and degenerative scoliosis.  She was sent here for evaluation of any other possible cause other than her stenosis. She says she has had 3 injections, her back pain is not significant it is more her legs. They get so weak sometimes she can hardly walk. She is afraid she will fall.  She has spinal stenosis and scoliosis. It is hard for her to go upstairs. Comng down the stairs she is afraid she may miss a step. When she walks for a long period they get very weak. The right leg has been this way for years, if she sits in the living room and the air hits her leg her leg doesn't feel cold on the inside. Her feet stay cold all the time. No numbness or tingling in the feet. She has to sleep in socks due to feeling cold but no burning or tingling. Her arms are fine.   Reviewed notes, labs and imaging from outside physicians, which showed:  MR of the lumbar spine was ordered by Dr. Rolena Infante, we will request CD with images.   I reviewed MRI of the brain images from 2019, June, unremarkable, moderate chronic small vessel ischemic changes but no cause for her leg weakness seen.  Review of Systems: Patient complains of symptoms per HPI as well as the following symptoms: tremor, weakness. Pertinent negatives and positives per HPI. All others negative.   Social History   Socioeconomic History   Marital status: Married    Spouse name: Not on file   Number of children: 1   Years of education: Not on file   Highest education level: Not on file  Occupational History   Not on file  Social Needs   Financial resource strain: Not on file   Food insecurity    Worry: Not on file  Inability: Not on file   Transportation needs    Medical: Not on file    Non-medical: Not on file  Tobacco Use   Smoking status: Never Smoker   Smokeless tobacco: Never Used  Substance and Sexual Activity   Alcohol use: No   Drug use: No   Sexual activity: Not on file  Lifestyle   Physical activity    Days per week: Not on file    Minutes per session: Not on file   Stress: Not on file  Relationships   Social connections    Talks on phone: Not on file    Gets together: Not on file    Attends religious service: Not on file    Active member of club or organization: Not on file     Attends meetings of clubs or organizations: Not on file    Relationship status: Not on file   Intimate partner violence    Fear of current or ex partner: Not on file    Emotionally abused: Not on file    Physically abused: Not on file    Forced sexual activity: Not on file  Other Topics Concern   Not on file  Social History Narrative   Lives at home with husband   Right handed   Caffeine: quit years ago    Family History  Problem Relation Age of Onset   CVA Mother    Heart attack Mother    Heart disease Mother    Diabetes Mother        late in life   Lymphoma Father    Non-Hodgkin's lymphoma Father    Heart attack Brother    Heart disease Brother    Alzheimer's disease Brother    Breast cancer Paternal Aunt    Colon cancer Neg Hx     Past Medical History:  Diagnosis Date   Abnormal EKG 01/23/2016   Anxiety    APL (acute promyelocytic leukemia) ([place])    Atrial fibrillation ([place])    Barrett's esophagus    Basal cell carcinoma of vulva ([place])    Depression    Endometrial polyp    "polyps" per intake sheet   GERD (gastroesophageal reflux disease)    Hemorrhoids    Hypertension    Spinal stenosis    Vaginal delivery 1964   Weakness     Patient Active Problem List   Diagnosis Date Noted   Spinal stenosis of lumbar region with neurogenic claudication 09/30/2019   Tremor of right hand 09/30/2019   Benign essential HTN 01/23/2016   Abnormal EKG 01/23/2016    Past Surgical History:  Procedure Laterality Date   CATARACT EXTRACTION, BILATERAL     august/september 2017   cataract surgery     CERVICAL POLYPECTOMY N/A 08/14/2015   Procedure: CERVICAL POLYPECTOMY;  Surgeon: Newton Pigg, MD;  Location: [facility];  Service: Gynecology;  Laterality: N/A;   CHOLECYSTECTOMY     endometrial polyp surgery     HEMORROIDECTOMY     HYSTEROSCOPY W/D&C N/A 08/14/2015   Procedure: DILATATION AND CURETTAGE /HYSTEROSCOPY;  Surgeon: Newton Pigg, MD;  Location: [facility];  Service: Gynecology;  Laterality: N/A;   KNEE SURGERY Right    PORTACATH PLACEMENT     TUNNELED VENOUS CATHETER PLACEMENT      Current Outpatient Medications  Medication Sig Dispense Refill   Cholecalciferol (VITAMIN D3 PO) Take 125 mcg by mouth daily.     diazepam (VALIUM) 10 MG tablet Take 10 mg by mouth 2 (two)  times daily.     OVER THE COUNTER MEDICATION 500 mg daily. Ashwaghanda extract     OVER THE COUNTER MEDICATION 2 each daily. Osteo-biflex     pantoprazole (PROTONIX) 40 MG tablet Take 1 tablet (40 mg total) by mouth daily. 90 tablet 3   TURMERIC CURCUMIN PO Take 1 each by mouth daily.     UNABLE TO FIND Med Name: Preservision and Mineral Supp 1 daily     aspirin EC 81 MG tablet Take 81 mg by mouth daily.     vitamin B-12 (CYANOCOBALAMIN) 1000 MCG tablet Take 1,000 mcg by mouth daily.     Current Facility-Administered Medications  Medication Dose Route Frequency Provider Last Rate Last Dose   0.9 %  sodium chloride infusion  500 mL Intravenous Once Irene Shipper, MD        Allergies as of 09/30/2019 - Review Complete 09/30/2019  Allergen Reaction Noted   Compazine [prochlorperazine edisylate] Hives 08/09/2015   Cortisone  07/21/2019   Eliquis [apixaban] Itching and Nausea Only 03/18/2017   Prednisone Nausea Only 03/18/2017   Prochlorperazine Other (See Comments) 03/27/2016    Vitals: BP (!) 152/90 (BP Location: Left Arm, Patient Position: Sitting)    Pulse 72    Temp (!) 96.9 F (36.1 C) Comment: taken at front   Ht 5' 7.5" (1.715 m)    Wt 178 lb (80.7 kg)    BMI 27.47 kg/m  Last Weight:  Wt Readings from Last 1 Encounters:  09/30/19 178 lb (80.7 kg)   Last Height:   Ht Readings from Last 1 Encounters:  09/30/19 5' 7.5" (1.715 m)     Physical exam: Exam: Gen: NAD, conversant, well nourised, obese, well groomed                     CV: RRR, no MRG. No Carotid Bruits. No peripheral edema, warm, nontender Eyes:  Conjunctivae clear without exudates or hemorrhage  Neuro: Detailed Neurologic Exam  Speech:    Speech is normal; fluent and spontaneous with normal comprehension.  Cognition:    The patient is oriented to person, place, and time;     recent and remote memory intact;     language fluent;     normal attention, concentration,     fund of knowledge Cranial Nerves:    The pupils are equal, round, and reactive to light. Attempted fundoscopy could not visualize due to small pupils. Visual fields are full to finger confrontation. Extraocular movements are intact. Trigeminal sensation is intact and the muscles of mastication are normal. The face is symmetric. The palate elevates in the midline. Hearing intact. Voice is normal. Shoulder shrug is normal. The tongue has normal motion without fasciculations.   Coordination:    Normal finger to nose  Gait:    Heel-toe normal. Some imbalance with tandem, needs to hold wall. Good stride.   Motor Observation:    No asymmetry, no atrophy, right hand postural and action tremor high frequency, low amplitude Tone:    Normal muscle tone.    Posture:    Cervical kyphosis.     Strength:    Strength is V/V in the upper and lower limbs.      Sensation: intact to LT, pin prick, vibration and propriocetion     Reflex Exam:  DTR's:    AJs and patellars 1+, biceps 2+ Toes:    The toes are downgoing bilaterally.   Clonus:    Clonus is absent.  Assessment/Plan:  Really lovely female with weakness of legs when walking distances. She has spinal stenosis so this may be neurogenic claudication as her strength is very good especially in the legs. Clinical exam demonstrates no focal motor deficits but globally she does feel imbalanced and weak subjectively.  Imaging studies demonstrated lumbar stenosis and degenerative scoliosis.  She was sent here for evaluation of any other possible cause other than her stenosis. Sensory exam is good and reflexes present  so significant distal polyneuropathy less likely a big factor. Bending over a cart in the store helps.   Patient has no significant weakness and her sensory exam is good distally so I have low suspicion for muscle disease such as myopathy/myositis or significant peripheral neuropathy causing her subjective weakness and imbalance but it would be prudent to examine further with emg/[place] right arm and right leg.   Recommended Physical Therapy for balance exercises, imbalance and near falls: She declines at this time  Tremor: Patient has tried alprazolam, gabapentin,metoprolol,propranolol. She was tried on primidone(did not tolerate) in the past as well as valium tid. She has also seen other neurologists including Dr. Baltazar Najjar and Dr. Metta Clines and Dr. Carles Collet for this. When she saw dr. Carles Collet, Dr. Carles Collet did not notice a tremor on exam. Patient has anxiety which may be contributory. I think patient should try to use conservative measures such as the ones she learned from OT, she has tried the best medications for tremor, other medications that we might use are not as effective as the ones she failed and have more side effects. She can discuss with her primary care and they should probably refer to [facility] or other movement specialist if necessary thanks.  OT: for tremor. Need OT for tremor. They can help her with compensation techniques as well as with devices such as wrist weights, weighted silverware etc, she was seen by Dr. Carles Collet in the past and tried on medication. We will defer on medication and try compensatory measures.  Orders Placed This Encounter  Procedures   Ambulatory referral to Occupational Therapy   NCV with EMG(electromyography)   Cc: Street, [name], *,  Melina Schools MD  Sarina Ill, MD  [facility] [address] [place], [place] [address]  Phone [phone] Fax [phone]

## 2019-09-30 NOTE — Patient Instructions (Addendum)
EMG/NCS for leg weakness  Compensation Strategies for Tremors  When eating, try the following  Eat out of bowls, divided plates, or use a plate guard (available at a medical supply store) and eat with a spoon so that you have an edge to scoop up food.  Try raising your plate so that there is less distance between the plate and mouth.Try stabilizing elbows on the tables or against your body.  Use utensil with built-up/larger grips as they are easier to hold.  When writing, try the following:  Stabilize forearm on the table.  Take your time as rushing/being stressed can increase tremors.  Try a felt-tipped pen, it does not glide as much.  Avoid gel pens ( they move to much ).  Consider using pre-printed labels with your name and address (carry them with you when you go out) or you can get stamps with your address or signature on it.  Use a small tape recorder to record messages/reminders for yourself.  Use pens with bigger grips.  When brushing your teeth, putting on make-up, or styling hair, try the following:  Use an electric toothbrush.  Use items with built-up grips.  Stabilize your elbows against your body or on the counter.  Use long-handled brushes/combs.  Use a hair dryer with a stand.  In general:  Avoid stress, fatigue or rushing as this can increase tremors.  Sit down for activities that require more control/coordination.  Perform "flicks".   Electromyoneurogram Electromyoneurogram is a test to check how well your muscles and nerves are working. This procedure includes the combined use of electromyogram (EMG) and nerve conduction study (NCS). EMG is used to look for muscular disorders. NCS, which is also called electroneurogram, measures how well your nerves are controlling your muscles. The procedures are usually done together to check if your muscles and nerves are healthy. If the results of the tests are abnormal, this may indicate disease or injury, such  as a neuromuscular disease or peripheral nerve damage. Tell a health care provider about:  Any allergies you have.  All medicines you are taking, including vitamins, herbs, eye drops, creams, and over-the-counter medicines.  Any problems you or family members have had with anesthetic medicines.  Any blood disorders you have.  Any surgeries you have had.  Any medical conditions you have.  If you have a pacemaker.  Whether you are pregnant or may be pregnant. What are the risks? Generally, this is a safe procedure. However, problems may occur, including:  Infection where the electrodes were inserted.  Bleeding. What happens before the procedure? Medicines Ask your health care provider about:  Changing or stopping your regular medicines. This is especially important if you are taking diabetes medicines or blood thinners.  Taking medicines such as aspirin and ibuprofen. These medicines can thin your blood. Do not take these medicines unless your health care provider tells you to take them.  Taking over-the-counter medicines, vitamins, herbs, and supplements. General instructions  Your health care provider may ask you to avoid: ? Beverages that have caffeine, such as coffee and tea. ? Any products that contain nicotine or tobacco. These products include cigarettes, e-cigarettes, and chewing tobacco. If you need help quitting, ask your health care provider.  Do not use lotions or creams on the same day that you will be having the procedure. What happens during the procedure? For EMG   Your health care provider will ask you to stay in a position so that he or she can access  the muscle that will be studied. You may be standing, sitting, or lying down.  You may be given a medicine that numbs the area (local anesthetic).  A very thin needle that has an electrode will be inserted into your muscle.  Another small electrode will be placed on your skin near the muscle.  Your  health care provider will ask you to continue to remain still.  The electrodes will send a signal that tells about the electrical activity of your muscles. You may see this on a monitor or hear it in the room.  After your muscles have been studied at rest, your health care provider will ask you to contract or flex your muscles. The electrodes will send a signal that tells about the electrical activity of your muscles.  Your health care provider will remove the electrodes and the electrode needles when the procedure is finished. The procedure may vary among health care providers and hospitals. For NCS   An electrode that records your nerve activity (recording electrode) will be placed on your skin by the muscle that is being studied.  An electrode that is used as a reference (reference electrode) will be placed near the recording electrode.  A paste or gel will be applied to your skin between the recording electrode and the reference electrode.  Your nerve will be stimulated with a mild shock. Your health care provider will measure how much time it takes for your muscle to react.  Your health care provider will remove the electrodes and the gel when the procedure is finished. The procedure may vary among health care providers and hospitals. What happens after the procedure?  It is up to you to get the results of your procedure. Ask your health care provider, or the department that is doing the procedure, when your results will be ready.  Your health care provider may: ? Give you medicines for any pain. ? Monitor the insertion sites to make sure that bleeding stops. Summary  Electromyoneurogram is a test to check how well your muscles and nerves are working.  If the results of the tests are abnormal, this may indicate disease or injury.  This is a safe procedure. However, problems may occur, such as bleeding and infection.  Your health care provider will do two tests to complete  this procedure. One checks your muscles (EMG) and another checks your nerves (NCS).  It is up to you to get the results of your procedure. Ask your health care provider, or the department that is doing the procedure, when your results will be ready. This information is not intended to replace advice given to you by your health care provider. Make sure you discuss any questions you have with your health care provider. Document Released: 02/14/2005 Document Revised: 06/30/2018 Document Reviewed: 06/12/2018 Elsevier Patient Education  2020 [employer].

## 2019-10-02 ENCOUNTER — Encounter: Payer: Self-pay | Admitting: Neurology

## 2019-10-04 DIAGNOSIS — C519 Malignant neoplasm of vulva, unspecified: Secondary | ICD-10-CM | POA: Diagnosis not present

## 2019-10-13 DIAGNOSIS — M1811 Unilateral primary osteoarthritis of first carpometacarpal joint, right hand: Secondary | ICD-10-CM | POA: Diagnosis not present

## 2019-10-13 DIAGNOSIS — R251 Tremor, unspecified: Secondary | ICD-10-CM | POA: Diagnosis not present

## 2019-10-13 DIAGNOSIS — M79641 Pain in right hand: Secondary | ICD-10-CM | POA: Diagnosis not present

## 2019-11-01 DIAGNOSIS — M1811 Unilateral primary osteoarthritis of first carpometacarpal joint, right hand: Secondary | ICD-10-CM | POA: Diagnosis not present

## 2019-11-01 DIAGNOSIS — R251 Tremor, unspecified: Secondary | ICD-10-CM | POA: Diagnosis not present

## 2019-11-01 DIAGNOSIS — M79641 Pain in right hand: Secondary | ICD-10-CM | POA: Diagnosis not present

## 2019-11-18 ENCOUNTER — Ambulatory Visit: Payer: Medicare HMO | Admitting: Neurology

## 2019-11-18 ENCOUNTER — Ambulatory Visit (INDEPENDENT_AMBULATORY_CARE_PROVIDER_SITE_OTHER): Payer: Medicare HMO | Admitting: Neurology

## 2019-11-18 ENCOUNTER — Other Ambulatory Visit: Payer: Self-pay

## 2019-11-18 DIAGNOSIS — M48062 Spinal stenosis, lumbar region with neurogenic claudication: Secondary | ICD-10-CM

## 2019-11-18 DIAGNOSIS — Z0289 Encounter for other administrative examinations: Secondary | ICD-10-CM

## 2019-11-18 DIAGNOSIS — R531 Weakness: Secondary | ICD-10-CM

## 2019-11-18 DIAGNOSIS — R29898 Other symptoms and signs involving the musculoskeletal system: Secondary | ICD-10-CM | POA: Diagnosis not present

## 2019-11-18 NOTE — Progress Notes (Signed)
See procedure note.

## 2019-11-18 NOTE — Progress Notes (Signed)
Full Name: [name] Gender: Female MRN #: [id number] Date of Birth: 1941/08/18    Visit Date: 11/18/2019 09:21 Age: 79 Years Examining Physician: Sarina Ill, MD  Referring Physician: Melina Schools, MD  History: Really lovely female with weakness of legs when walking distances. She has spinal stenosis so this may be neurogenic claudication as her strength is very good especially in the legs. Clinical exam demonstrates no focal motor deficits but she does feel imbalanced and weak subjectively.  Imaging studies demonstrated lumbar stenosis and degenerative scoliosis.  She was sent here for evaluation of any other possible cause other than her central canal stenosis. Sensory exam is good and reflexes present so significant distal polyneuropathy less likely a big factor. Bending over a cart in the store helps.   Summary: EMG/NCS performed on the right upper and lower extremities.  The right superficial peroneal sensory nerve showed reduced amplitude (3 V, normal greater than 6). All remaining nerves (as indicated in the following tables) were within normal limits.  All muscles (as indicated in the following tables) were within normal limits.       Conclusion: Essentially normal EMG nerve conduction study of the right upper and lower extremities.  There was a slight decrease in one distal lower sensory nerve which can be normal at patient's age or may be a very mild/early polyneuropathy.  No peripheral explanation seen on EMG nerve conduction study for patients symptoms.   Sarina Ill M.D.  [facility] [place], [place] [address] Tel: [phone] Fax: [phone]         [facility]    Nerve / Sites Muscle Latency Ref. Amplitude Ref. Rel Amp Segments Distance Velocity Ref. Area    ms ms mV mV %  cm m/s m/s mVms  R Median - APB     Wrist APB 3.4 ?4.4 5.0 ?4.0 100 Wrist - APB 7   17.8     Upper arm APB 7.7  5.0  98.4 Upper arm - Wrist 24 55 ?49 17.7   R Ulnar - ADM     Wrist ADM 2.4 ?3.3 8.1 ?6.0 100 Wrist - ADM 7   32.7     B.Elbow ADM 5.7  8.0  99.2 B.Elbow - Wrist 20 61 ?49 31.2     A.Elbow ADM 7.4  7.5  93.1 A.Elbow - B.Elbow 10 60 ?49 29.4         A.Elbow - Wrist      R Peroneal - EDB     Ankle EDB 5.3 ?6.5 3.5 ?2.0 100 Ankle - EDB 9   12.4     Fib head EDB 12.0  3.4  98.3 Fib head - Ankle 31 46 ?44 13.0     Pop fossa EDB 14.1  3.3  96.5 Pop fossa - Fib head 10 46 ?44 12.5         Pop fossa - Ankle      R Tibial - AH     Ankle AH 3.9 ?5.8 4.7 ?4.0 100 Ankle - AH 9   12.3     Pop fossa AH 13.8  3.5  74.7 Pop fossa - Ankle 41 41 ?41 11.2             SNC    Nerve / Sites Rec. Site Peak Lat Ref.  Amp Ref. Segments Distance Peak Diff Ref.    ms ms V V  cm ms ms  R Sural - Ankle (Calf)  Calf Ankle 3.4 ?4.4 9 ?6 Calf - Ankle 14    R Superficial peroneal - Ankle     Lat leg Ankle 4.0 ?4.4 3 ?6 Lat leg - Ankle 14    R Median, Ulnar - Transcarpal comparison     Median Palm Wrist 2.1 ?2.2 62 ?35 Median Palm - Wrist 8       Ulnar Palm Wrist 1.9 ?2.2 15 ?12 Ulnar Palm - Wrist 8          Median Palm - Ulnar Palm  0.2 ?0.4  R Median - Orthodromic (Dig II, Mid palm)     Dig II Wrist 3.2 ?3.4 10 ?10 Dig II - Wrist 13    R Ulnar - Orthodromic, (Dig V, Mid palm)     Dig V Wrist 2.8 ?3.1 5 ?5 Dig V - Wrist 45                 F  Wave    Nerve F Lat Ref.   ms ms  R Tibial - AH 51.3 ?56.0  R Ulnar - ADM 29.1 ?32.0         EMG Summary Table    Spontaneous MUAP Recruitment  Muscle IA Fib PSW Fasc Other Amp Dur. Poly Pattern  R. Deltoid Normal None None None _______ Normal Normal Normal Normal  R. Triceps brachii Normal None None None _______ Normal Normal Normal Normal  R. Biceps brachii Normal None None None _______ Normal Normal Normal Normal  R. Opponens pollicis Normal None None None _______ Normal Normal Normal Normal  R. Pronator teres Normal None None None _______ Normal Normal Normal Normal  R. Iliopsoas Normal None None  None _______ Normal Normal Normal Normal  R. Vastus medialis Normal None None None _______ Normal Normal Normal Normal  R. Tibialis anterior Normal None None None _______ Normal Normal Normal Normal  R. Gastrocnemius (Medial head) Normal None None None _______ Normal Normal Normal Normal  R. Extensor hallucis longus Normal None None None _______ Normal Normal Normal Normal

## 2019-11-18 NOTE — Progress Notes (Signed)
History: Really lovely female with weakness of legs when walking distances. She has spinal stenosis so this may be neurogenic claudication as her strength is very good especially in the legs. Clinical exam demonstrates no focal motor deficits but subjectively she does feel imbalanced and weak subjectively.  Imaging studies demonstrated lumbar stenosis and degenerative scoliosis.  She was sent here for evaluation of any other possible cause other than her stenosis. Sensory exam is good and reflexes present so significant distal polyneuropathy less likely a big factor. Bending over a cart in the store helps.   Patient has no significant weakness and her sensory exam is good distally so I have low suspicion for muscle disease such as myopathy/myositis or significant peripheral neuropathy causing her subjective weakness and imbalance.  Today I reviewed the findings of patient's EMG nerve conduction study, essentially normal of the right upper and lower extremities, no myopathy or myositis, no radiculopathy seen, no significant neuropathy seen.  I do feel most of patient's symptoms are likely due to her lumbar spinal stenosis.  No peripheral causes of her weakness seen on this test.    A total of 15 minutes was spent on this patient's care, reviewing imaging, past records, recent hospitalization notes and results. Over half this time was spent on counseling patient on the  1. Spinal stenosis of lumbar region with neurogenic claudication   2. Weakness of both lower extremities    diagnosis and different diagnostic and therapeutic options, counseling and coordination of care, risks and benefitsof management, compliance, or risk factor reduction and education.  This does not include time spent on EMG nerve conduction study procedure.  Cc: Melina Schools, MD, Christa See MD

## 2019-11-23 ENCOUNTER — Telehealth: Payer: Self-pay | Admitting: Neurology

## 2019-11-23 NOTE — Procedures (Signed)
Full Name: [name] Gender: Female MRN #: [id number] Date of Birth: [birth year]    Visit Date: 11/18/2019 09:21 Age: 79 Years Examining Physician: Sarina Ill, MD  Referring Physician: Melina Schools, MD  History: Really lovely female with weakness of legs when walking distances. She has spinal stenosis so this may be neurogenic claudication as her strength is very good especially in the legs. Clinical exam demonstrates no focal motor deficits but she does feel imbalanced and weak subjectively.  Imaging studies demonstrated lumbar stenosis and degenerative scoliosis.  She was sent here for evaluation of any other possible cause other than her central canal stenosis. Sensory exam is good and reflexes present so significant distal polyneuropathy less likely a big factor. Bending over a cart in the store helps.   Summary: EMG/NCS performed on the right upper and lower extremities.  The right superficial peroneal sensory nerve showed reduced amplitude (3 V, normal greater than 6). All remaining nerves (as indicated in the following tables) were within normal limits.  All muscles (as indicated in the following tables) were within normal limits.       Conclusion: Essentially normal EMG nerve conduction study of the right upper and lower extremities.  There was a slight decrease in one distal lower sensory nerve which can be normal at patient's age or may be a very mild/early polyneuropathy.  No peripheral explanation seen on EMG nerve conduction study for patients symptoms.   Sarina Ill M.D.  [facility] [place], [place] [address] Tel: [phone] Fax: [phone]         [facility]    Nerve / Sites Muscle Latency Ref. Amplitude Ref. Rel Amp Segments Distance Velocity Ref. Area    ms ms mV mV %  cm m/s m/s mVms  R Median - APB     Wrist APB 3.4 ?4.4 5.0 ?4.0 100 Wrist - APB 7   17.8     Upper arm APB 7.7  5.0  98.4 Upper arm - Wrist 24 55 ?49 17.7    R Ulnar - ADM     Wrist ADM 2.4 ?3.3 8.1 ?6.0 100 Wrist - ADM 7   32.7     B.Elbow ADM 5.7  8.0  99.2 B.Elbow - Wrist 20 61 ?49 31.2     A.Elbow ADM 7.4  7.5  93.1 A.Elbow - B.Elbow 10 60 ?49 29.4         A.Elbow - Wrist      R Peroneal - EDB     Ankle EDB 5.3 ?6.5 3.5 ?2.0 100 Ankle - EDB 9   12.4     Fib head EDB 12.0  3.4  98.3 Fib head - Ankle 31 46 ?44 13.0     Pop fossa EDB 14.1  3.3  96.5 Pop fossa - Fib head 10 46 ?44 12.5         Pop fossa - Ankle      R Tibial - AH     Ankle AH 3.9 ?5.8 4.7 ?4.0 100 Ankle - AH 9   12.3     Pop fossa AH 13.8  3.5  74.7 Pop fossa - Ankle 41 41 ?41 11.2             SNC    Nerve / Sites Rec. Site Peak Lat Ref.  Amp Ref. Segments Distance Peak Diff Ref.    ms ms V V  cm ms ms  R Sural - Ankle (  Calf)     Calf Ankle 3.4 ?4.4 9 ?6 Calf - Ankle 14    R Superficial peroneal - Ankle     Lat leg Ankle 4.0 ?4.4 3 ?6 Lat leg - Ankle 14    R Median, Ulnar - Transcarpal comparison     Median Palm Wrist 2.1 ?2.2 62 ?35 Median Palm - Wrist 8       Ulnar Palm Wrist 1.9 ?2.2 15 ?12 Ulnar Palm - Wrist 8          Median Palm - Ulnar Palm  0.2 ?0.4  R Median - Orthodromic (Dig II, Mid palm)     Dig II Wrist 3.2 ?3.4 10 ?10 Dig II - Wrist 13    R Ulnar - Orthodromic, (Dig V, Mid palm)     Dig V Wrist 2.8 ?3.1 5 ?5 Dig V - Wrist 51                 F  Wave    Nerve F Lat Ref.   ms ms  R Tibial - AH 51.3 ?56.0  R Ulnar - ADM 29.1 ?32.0         EMG Summary Table    Spontaneous MUAP Recruitment  Muscle IA Fib PSW Fasc Other Amp Dur. Poly Pattern  R. Deltoid Normal None None None _______ Normal Normal Normal Normal  R. Triceps brachii Normal None None None _______ Normal Normal Normal Normal  R. Biceps brachii Normal None None None _______ Normal Normal Normal Normal  R. Opponens pollicis Normal None None None _______ Normal Normal Normal Normal  R. Pronator teres Normal None None None _______ Normal Normal Normal Normal  R. Iliopsoas Normal None None  None _______ Normal Normal Normal Normal  R. Vastus medialis Normal None None None _______ Normal Normal Normal Normal  R. Tibialis anterior Normal None None None _______ Normal Normal Normal Normal  R. Gastrocnemius (Medial head) Normal None None None _______ Normal Normal Normal Normal  R. Extensor hallucis longus Normal None None None _______ Normal Normal Normal Normal

## 2019-11-23 NOTE — Telephone Encounter (Signed)
Pt called stating that she may be mistaken but she was under the impression that a Dr. Nelva Bush was to help her with her back injections but she has not received a call about this. She would like RN to call and discuss this with her.

## 2019-11-25 NOTE — Telephone Encounter (Signed)
Spoke with pt. Pt aware Dr. Jaynee Eagles completed her work-up and has returned pt to Dr. Rolena Infante who sent pt to see Dr. Jaynee Eagles. She is interested in switching to Dr. Nelva Bush. I advised her to contact their office ([facility]) so they can auth the switch if ok.

## 2019-11-25 NOTE — Telephone Encounter (Signed)
Spoke with Dr. Jaynee Eagles. Pt was to return to Dr. Rolena Infante.

## 2019-11-30 ENCOUNTER — Telehealth: Payer: Self-pay | Admitting: Neurology

## 2019-11-30 NOTE — Telephone Encounter (Signed)
[name], I discussed tremors with patient. She has tried a lot of medications and I recommended the following in my note:  "OT: for tremor. Need OT for tremor. They can help her with compensation techniques as well as with devices such as wrist weights, weighted silverware etc, she was seen by Dr. Carles Collet in the past and tried on medication. We will defer on medication and try compensatory measures."  I would have her go to OT and complete this. If it doesn't help she can follow up here afterwards but she has to go see OT and see if this can help her without further medications (she has tried and failed multiple). Also meds have side effects, please encourage her to see OT and she can follow up afterwards thanks

## 2019-11-30 NOTE — Telephone Encounter (Signed)
Patient is aware that Dr Jaynee Eagles will be handling this

## 2019-11-30 NOTE — Telephone Encounter (Signed)
Patient stated that she did ask Dr Jaynee Eagles and was advised that she did not work with hands. She said that they told her she would need to talk with someone else.

## 2019-11-30 NOTE — Telephone Encounter (Signed)
It appears that she is actively seeing Dr. Jaynee Eagles and, in fact, just saw her a few days ago.  My advice would be to ask the neurologist that she is currently seeing for advice.

## 2019-11-30 NOTE — Telephone Encounter (Signed)
Please advise on below  

## 2019-11-30 NOTE — Telephone Encounter (Signed)
Dr. Jaynee Eagles mentioned tremor in her notes.  I have copied Dr. Jaynee Eagles on this.  I haven't seen the patient in years.  Dr. Jaynee Eagles, I have asked this patient to follow up with you.  I haven't seen her in years.

## 2019-11-30 NOTE — Telephone Encounter (Signed)
That's fine, thanks!

## 2019-11-30 NOTE — Telephone Encounter (Signed)
Patient called in needing to schedule a follow up appointment for her hand tremor with Dr. Carles Collet. She has not been seen since 2019. She has a follow up appointment for May 2021. She would like to know what to do until that appointment. Please Call. Thank you

## 2019-11-30 NOTE — Telephone Encounter (Signed)
Please let pt know

## 2019-12-02 NOTE — Telephone Encounter (Signed)
I spoke with the patient. She said she has seen a therapist who gave her weights and a weighted pencil but they haven't helped. She also has received a compression glove for arthritis. She did say she had not tried that many medications, only the primidone but it didn't help and she also said she had never tried Gabapentin. She couldn't remember taking Propranolol. She is willing to be referred to [facility]. I told her I would send a message to Dr. Jaynee Eagles. She verbalized appreciation.

## 2019-12-06 NOTE — Telephone Encounter (Signed)
Let me review her chart, I thought Dr. Carles Collet had tried multiple medications. If not I will see her.

## 2019-12-08 NOTE — Telephone Encounter (Signed)
Patient has tried alprazolam, gabapentin,metoprolol,propranolol. She was tried on primidone in the past as well as valium. She has also seen other neurologists including Dr. Baltazar Najjar and Dr. Metta Clines and Dr. Carles Collet for this. When she saw dr. Carles Collet, Dr. Carles Collet did not notice a tremor on exam and she also has anxiety. I think patient should try to use conservative measures such as the ones she learned from OT, she has tried the best medications for tremor, other medications that we might use are not as effective as the ones she failed and have more side effects. She can discuss with her primary care and they should probably refer to [facility] or other movement specialist if necessary thanks.

## 2019-12-09 NOTE — Telephone Encounter (Signed)
Spoke with [name] and discussed Dr. Cathren Laine most recent message below. She stated she had not ever tried gabapentin. She did verbalize understanding of the message however and said she would contact Dr. Venetia Maxon to discuss. She verbalized appreciation for the call.

## 2019-12-14 DIAGNOSIS — D6869 Other thrombophilia: Secondary | ICD-10-CM | POA: Diagnosis not present

## 2019-12-14 DIAGNOSIS — I10 Essential (primary) hypertension: Secondary | ICD-10-CM | POA: Diagnosis not present

## 2019-12-14 DIAGNOSIS — I480 Paroxysmal atrial fibrillation: Secondary | ICD-10-CM | POA: Diagnosis not present

## 2019-12-14 DIAGNOSIS — M48061 Spinal stenosis, lumbar region without neurogenic claudication: Secondary | ICD-10-CM | POA: Diagnosis not present

## 2019-12-14 DIAGNOSIS — G250 Essential tremor: Secondary | ICD-10-CM | POA: Diagnosis not present

## 2019-12-14 DIAGNOSIS — Z6827 Body mass index (BMI) 27.0-27.9, adult: Secondary | ICD-10-CM | POA: Diagnosis not present

## 2019-12-14 DIAGNOSIS — E663 Overweight: Secondary | ICD-10-CM | POA: Diagnosis not present

## 2019-12-14 DIAGNOSIS — F411 Generalized anxiety disorder: Secondary | ICD-10-CM | POA: Diagnosis not present

## 2019-12-21 DIAGNOSIS — M5136 Other intervertebral disc degeneration, lumbar region: Secondary | ICD-10-CM | POA: Diagnosis not present

## 2019-12-21 DIAGNOSIS — M48062 Spinal stenosis, lumbar region with neurogenic claudication: Secondary | ICD-10-CM | POA: Diagnosis not present

## 2019-12-27 DIAGNOSIS — H40013 Open angle with borderline findings, low risk, bilateral: Secondary | ICD-10-CM | POA: Diagnosis not present

## 2019-12-27 DIAGNOSIS — H04123 Dry eye syndrome of bilateral lacrimal glands: Secondary | ICD-10-CM | POA: Diagnosis not present

## 2020-01-03 DIAGNOSIS — C519 Malignant neoplasm of vulva, unspecified: Secondary | ICD-10-CM | POA: Diagnosis not present

## 2020-01-06 DIAGNOSIS — Z1331 Encounter for screening for depression: Secondary | ICD-10-CM | POA: Diagnosis not present

## 2020-01-06 DIAGNOSIS — M48061 Spinal stenosis, lumbar region without neurogenic claudication: Secondary | ICD-10-CM | POA: Diagnosis not present

## 2020-01-06 DIAGNOSIS — G250 Essential tremor: Secondary | ICD-10-CM | POA: Diagnosis not present

## 2020-01-06 DIAGNOSIS — M170 Bilateral primary osteoarthritis of knee: Secondary | ICD-10-CM | POA: Diagnosis not present

## 2020-01-06 DIAGNOSIS — Z9181 History of falling: Secondary | ICD-10-CM | POA: Diagnosis not present

## 2020-01-06 DIAGNOSIS — D6869 Other thrombophilia: Secondary | ICD-10-CM | POA: Diagnosis not present

## 2020-01-06 DIAGNOSIS — I10 Essential (primary) hypertension: Secondary | ICD-10-CM | POA: Diagnosis not present

## 2020-01-06 DIAGNOSIS — R69 Illness, unspecified: Secondary | ICD-10-CM | POA: Diagnosis not present

## 2020-01-06 DIAGNOSIS — E663 Overweight: Secondary | ICD-10-CM | POA: Diagnosis not present

## 2020-01-06 DIAGNOSIS — Z6827 Body mass index (BMI) 27.0-27.9, adult: Secondary | ICD-10-CM | POA: Diagnosis not present

## 2020-01-06 DIAGNOSIS — I480 Paroxysmal atrial fibrillation: Secondary | ICD-10-CM | POA: Diagnosis not present

## 2020-01-18 DIAGNOSIS — M5416 Radiculopathy, lumbar region: Secondary | ICD-10-CM | POA: Diagnosis not present

## 2020-01-20 DIAGNOSIS — E785 Hyperlipidemia, unspecified: Secondary | ICD-10-CM | POA: Diagnosis not present

## 2020-01-20 DIAGNOSIS — D519 Vitamin B12 deficiency anemia, unspecified: Secondary | ICD-10-CM | POA: Diagnosis not present

## 2020-01-20 DIAGNOSIS — E559 Vitamin D deficiency, unspecified: Secondary | ICD-10-CM | POA: Diagnosis not present

## 2020-01-20 DIAGNOSIS — Z79899 Other long term (current) drug therapy: Secondary | ICD-10-CM | POA: Diagnosis not present

## 2020-01-20 DIAGNOSIS — R739 Hyperglycemia, unspecified: Secondary | ICD-10-CM | POA: Diagnosis not present

## 2020-01-22 DIAGNOSIS — B9689 Other specified bacterial agents as the cause of diseases classified elsewhere: Secondary | ICD-10-CM | POA: Diagnosis not present

## 2020-01-22 DIAGNOSIS — N390 Urinary tract infection, site not specified: Secondary | ICD-10-CM | POA: Diagnosis not present

## 2020-01-22 DIAGNOSIS — R531 Weakness: Secondary | ICD-10-CM | POA: Diagnosis not present

## 2020-01-26 DIAGNOSIS — I10 Essential (primary) hypertension: Secondary | ICD-10-CM | POA: Diagnosis not present

## 2020-01-26 DIAGNOSIS — R69 Illness, unspecified: Secondary | ICD-10-CM | POA: Diagnosis not present

## 2020-01-31 DIAGNOSIS — M5416 Radiculopathy, lumbar region: Secondary | ICD-10-CM | POA: Diagnosis not present

## 2020-02-01 DIAGNOSIS — E785 Hyperlipidemia, unspecified: Secondary | ICD-10-CM | POA: Diagnosis not present

## 2020-02-01 DIAGNOSIS — B370 Candidal stomatitis: Secondary | ICD-10-CM | POA: Diagnosis not present

## 2020-02-01 DIAGNOSIS — Z6827 Body mass index (BMI) 27.0-27.9, adult: Secondary | ICD-10-CM | POA: Diagnosis not present

## 2020-02-01 DIAGNOSIS — N3091 Cystitis, unspecified with hematuria: Secondary | ICD-10-CM | POA: Diagnosis not present

## 2020-02-01 DIAGNOSIS — R531 Weakness: Secondary | ICD-10-CM | POA: Diagnosis not present

## 2020-02-01 DIAGNOSIS — Z79899 Other long term (current) drug therapy: Secondary | ICD-10-CM | POA: Diagnosis not present

## 2020-02-07 DIAGNOSIS — R309 Painful micturition, unspecified: Secondary | ICD-10-CM | POA: Diagnosis not present

## 2020-02-10 DIAGNOSIS — Z6827 Body mass index (BMI) 27.0-27.9, adult: Secondary | ICD-10-CM | POA: Diagnosis not present

## 2020-02-10 DIAGNOSIS — B3781 Candidal esophagitis: Secondary | ICD-10-CM | POA: Diagnosis not present

## 2020-02-10 DIAGNOSIS — B370 Candidal stomatitis: Secondary | ICD-10-CM | POA: Diagnosis not present

## 2020-02-10 DIAGNOSIS — E663 Overweight: Secondary | ICD-10-CM | POA: Diagnosis not present

## 2020-02-10 DIAGNOSIS — R69 Illness, unspecified: Secondary | ICD-10-CM | POA: Diagnosis not present

## 2020-02-22 DIAGNOSIS — R531 Weakness: Secondary | ICD-10-CM | POA: Diagnosis not present

## 2020-02-22 DIAGNOSIS — B370 Candidal stomatitis: Secondary | ICD-10-CM | POA: Diagnosis not present

## 2020-02-22 DIAGNOSIS — Z6827 Body mass index (BMI) 27.0-27.9, adult: Secondary | ICD-10-CM | POA: Diagnosis not present

## 2020-02-22 DIAGNOSIS — G2581 Restless legs syndrome: Secondary | ICD-10-CM | POA: Diagnosis not present

## 2020-02-23 DIAGNOSIS — K219 Gastro-esophageal reflux disease without esophagitis: Secondary | ICD-10-CM | POA: Diagnosis not present

## 2020-02-23 DIAGNOSIS — R69 Illness, unspecified: Secondary | ICD-10-CM | POA: Diagnosis not present

## 2020-02-23 DIAGNOSIS — R682 Dry mouth, unspecified: Secondary | ICD-10-CM | POA: Diagnosis not present

## 2020-02-23 DIAGNOSIS — G2581 Restless legs syndrome: Secondary | ICD-10-CM | POA: Diagnosis not present

## 2020-02-23 DIAGNOSIS — R030 Elevated blood-pressure reading, without diagnosis of hypertension: Secondary | ICD-10-CM | POA: Diagnosis not present

## 2020-02-25 DIAGNOSIS — G250 Essential tremor: Secondary | ICD-10-CM | POA: Diagnosis not present

## 2020-02-25 DIAGNOSIS — I10 Essential (primary) hypertension: Secondary | ICD-10-CM | POA: Diagnosis not present

## 2020-02-25 DIAGNOSIS — R69 Illness, unspecified: Secondary | ICD-10-CM | POA: Diagnosis not present

## 2020-02-29 DIAGNOSIS — M5136 Other intervertebral disc degeneration, lumbar region: Secondary | ICD-10-CM | POA: Diagnosis not present

## 2020-03-06 DIAGNOSIS — Z888 Allergy status to other drugs, medicaments and biological substances status: Secondary | ICD-10-CM | POA: Diagnosis not present

## 2020-03-06 DIAGNOSIS — R0600 Dyspnea, unspecified: Secondary | ICD-10-CM | POA: Diagnosis not present

## 2020-03-06 DIAGNOSIS — R0602 Shortness of breath: Secondary | ICD-10-CM | POA: Diagnosis not present

## 2020-03-06 DIAGNOSIS — R69 Illness, unspecified: Secondary | ICD-10-CM | POA: Diagnosis not present

## 2020-03-06 DIAGNOSIS — R531 Weakness: Secondary | ICD-10-CM | POA: Diagnosis not present

## 2020-03-08 DIAGNOSIS — I480 Paroxysmal atrial fibrillation: Secondary | ICD-10-CM | POA: Diagnosis not present

## 2020-03-08 DIAGNOSIS — M48061 Spinal stenosis, lumbar region without neurogenic claudication: Secondary | ICD-10-CM | POA: Diagnosis not present

## 2020-03-08 DIAGNOSIS — R69 Illness, unspecified: Secondary | ICD-10-CM | POA: Diagnosis not present

## 2020-03-08 DIAGNOSIS — Z6827 Body mass index (BMI) 27.0-27.9, adult: Secondary | ICD-10-CM | POA: Diagnosis not present

## 2020-03-08 DIAGNOSIS — D6869 Other thrombophilia: Secondary | ICD-10-CM | POA: Diagnosis not present

## 2020-03-08 DIAGNOSIS — E663 Overweight: Secondary | ICD-10-CM | POA: Diagnosis not present

## 2020-03-08 DIAGNOSIS — I10 Essential (primary) hypertension: Secondary | ICD-10-CM | POA: Diagnosis not present

## 2020-03-16 DIAGNOSIS — M25562 Pain in left knee: Secondary | ICD-10-CM | POA: Diagnosis not present

## 2020-03-16 DIAGNOSIS — G8929 Other chronic pain: Secondary | ICD-10-CM | POA: Diagnosis not present

## 2020-03-16 DIAGNOSIS — M25561 Pain in right knee: Secondary | ICD-10-CM | POA: Diagnosis not present

## 2020-03-22 ENCOUNTER — Ambulatory Visit: Payer: Medicare HMO | Admitting: Neurology

## 2020-03-27 DIAGNOSIS — K219 Gastro-esophageal reflux disease without esophagitis: Secondary | ICD-10-CM | POA: Diagnosis not present

## 2020-03-27 DIAGNOSIS — I480 Paroxysmal atrial fibrillation: Secondary | ICD-10-CM | POA: Diagnosis not present

## 2020-03-27 DIAGNOSIS — I10 Essential (primary) hypertension: Secondary | ICD-10-CM | POA: Diagnosis not present

## 2020-03-27 DIAGNOSIS — E559 Vitamin D deficiency, unspecified: Secondary | ICD-10-CM | POA: Diagnosis not present

## 2020-04-03 DIAGNOSIS — L900 Lichen sclerosus et atrophicus: Secondary | ICD-10-CM | POA: Diagnosis not present

## 2020-04-03 DIAGNOSIS — C519 Malignant neoplasm of vulva, unspecified: Secondary | ICD-10-CM | POA: Diagnosis not present

## 2020-04-06 DIAGNOSIS — I480 Paroxysmal atrial fibrillation: Secondary | ICD-10-CM | POA: Diagnosis not present

## 2020-04-06 DIAGNOSIS — R69 Illness, unspecified: Secondary | ICD-10-CM | POA: Diagnosis not present

## 2020-04-06 DIAGNOSIS — E663 Overweight: Secondary | ICD-10-CM | POA: Diagnosis not present

## 2020-04-06 DIAGNOSIS — Z6827 Body mass index (BMI) 27.0-27.9, adult: Secondary | ICD-10-CM | POA: Diagnosis not present

## 2020-04-06 DIAGNOSIS — D6869 Other thrombophilia: Secondary | ICD-10-CM | POA: Diagnosis not present

## 2020-05-02 DIAGNOSIS — S8991XA Unspecified injury of right lower leg, initial encounter: Secondary | ICD-10-CM | POA: Diagnosis not present

## 2020-05-02 DIAGNOSIS — M1711 Unilateral primary osteoarthritis, right knee: Secondary | ICD-10-CM | POA: Diagnosis not present

## 2020-05-02 DIAGNOSIS — M25561 Pain in right knee: Secondary | ICD-10-CM | POA: Diagnosis not present

## 2020-05-22 DIAGNOSIS — M1711 Unilateral primary osteoarthritis, right knee: Secondary | ICD-10-CM | POA: Diagnosis not present

## 2020-05-27 DIAGNOSIS — R69 Illness, unspecified: Secondary | ICD-10-CM | POA: Diagnosis not present

## 2020-05-27 DIAGNOSIS — I480 Paroxysmal atrial fibrillation: Secondary | ICD-10-CM | POA: Diagnosis not present

## 2020-05-27 DIAGNOSIS — I10 Essential (primary) hypertension: Secondary | ICD-10-CM | POA: Diagnosis not present

## 2020-05-27 DIAGNOSIS — K219 Gastro-esophageal reflux disease without esophagitis: Secondary | ICD-10-CM | POA: Diagnosis not present

## 2020-05-29 DIAGNOSIS — R739 Hyperglycemia, unspecified: Secondary | ICD-10-CM | POA: Diagnosis not present

## 2020-05-29 DIAGNOSIS — K219 Gastro-esophageal reflux disease without esophagitis: Secondary | ICD-10-CM | POA: Diagnosis not present

## 2020-05-29 DIAGNOSIS — D6869 Other thrombophilia: Secondary | ICD-10-CM | POA: Diagnosis not present

## 2020-05-29 DIAGNOSIS — Z01818 Encounter for other preprocedural examination: Secondary | ICD-10-CM | POA: Diagnosis not present

## 2020-05-29 DIAGNOSIS — R69 Illness, unspecified: Secondary | ICD-10-CM | POA: Diagnosis not present

## 2020-05-29 DIAGNOSIS — E663 Overweight: Secondary | ICD-10-CM | POA: Diagnosis not present

## 2020-05-29 DIAGNOSIS — Z79899 Other long term (current) drug therapy: Secondary | ICD-10-CM | POA: Diagnosis not present

## 2020-05-29 DIAGNOSIS — E782 Mixed hyperlipidemia: Secondary | ICD-10-CM | POA: Diagnosis not present

## 2020-05-29 DIAGNOSIS — I480 Paroxysmal atrial fibrillation: Secondary | ICD-10-CM | POA: Diagnosis not present

## 2020-05-29 DIAGNOSIS — M1711 Unilateral primary osteoarthritis, right knee: Secondary | ICD-10-CM | POA: Diagnosis not present

## 2020-06-06 DIAGNOSIS — M1710 Unilateral primary osteoarthritis, unspecified knee: Secondary | ICD-10-CM | POA: Insufficient documentation

## 2020-06-21 DIAGNOSIS — M1711 Unilateral primary osteoarthritis, right knee: Secondary | ICD-10-CM | POA: Diagnosis not present

## 2020-06-21 DIAGNOSIS — Z01812 Encounter for preprocedural laboratory examination: Secondary | ICD-10-CM | POA: Diagnosis not present

## 2020-06-21 DIAGNOSIS — Z01818 Encounter for other preprocedural examination: Secondary | ICD-10-CM | POA: Diagnosis not present

## 2020-06-26 DIAGNOSIS — H26493 Other secondary cataract, bilateral: Secondary | ICD-10-CM | POA: Diagnosis not present

## 2020-06-26 DIAGNOSIS — H40013 Open angle with borderline findings, low risk, bilateral: Secondary | ICD-10-CM | POA: Diagnosis not present

## 2020-06-26 DIAGNOSIS — H35363 Drusen (degenerative) of macula, bilateral: Secondary | ICD-10-CM | POA: Diagnosis not present

## 2020-06-26 DIAGNOSIS — H35033 Hypertensive retinopathy, bilateral: Secondary | ICD-10-CM | POA: Diagnosis not present

## 2020-07-10 DIAGNOSIS — M25562 Pain in left knee: Secondary | ICD-10-CM | POA: Diagnosis not present

## 2020-07-10 DIAGNOSIS — M25561 Pain in right knee: Secondary | ICD-10-CM | POA: Diagnosis not present

## 2020-07-10 DIAGNOSIS — G8929 Other chronic pain: Secondary | ICD-10-CM | POA: Diagnosis not present

## 2020-07-14 DIAGNOSIS — C519 Malignant neoplasm of vulva, unspecified: Secondary | ICD-10-CM | POA: Diagnosis not present

## 2020-07-27 DIAGNOSIS — I10 Essential (primary) hypertension: Secondary | ICD-10-CM | POA: Diagnosis not present

## 2020-07-27 DIAGNOSIS — R69 Illness, unspecified: Secondary | ICD-10-CM | POA: Diagnosis not present

## 2020-07-27 DIAGNOSIS — I480 Paroxysmal atrial fibrillation: Secondary | ICD-10-CM | POA: Diagnosis not present

## 2020-07-27 DIAGNOSIS — K219 Gastro-esophageal reflux disease without esophagitis: Secondary | ICD-10-CM | POA: Diagnosis not present

## 2020-08-08 DIAGNOSIS — M48061 Spinal stenosis, lumbar region without neurogenic claudication: Secondary | ICD-10-CM | POA: Diagnosis not present

## 2020-08-08 DIAGNOSIS — Z23 Encounter for immunization: Secondary | ICD-10-CM | POA: Diagnosis not present

## 2020-08-08 DIAGNOSIS — K2960 Other gastritis without bleeding: Secondary | ICD-10-CM | POA: Diagnosis not present

## 2020-08-08 DIAGNOSIS — D6869 Other thrombophilia: Secondary | ICD-10-CM | POA: Diagnosis not present

## 2020-08-08 DIAGNOSIS — E559 Vitamin D deficiency, unspecified: Secondary | ICD-10-CM | POA: Diagnosis not present

## 2020-08-08 DIAGNOSIS — I480 Paroxysmal atrial fibrillation: Secondary | ICD-10-CM | POA: Diagnosis not present

## 2020-08-08 DIAGNOSIS — E663 Overweight: Secondary | ICD-10-CM | POA: Diagnosis not present

## 2020-08-08 DIAGNOSIS — R69 Illness, unspecified: Secondary | ICD-10-CM | POA: Diagnosis not present

## 2020-08-08 DIAGNOSIS — Z856 Personal history of leukemia: Secondary | ICD-10-CM | POA: Diagnosis not present

## 2020-08-08 DIAGNOSIS — I10 Essential (primary) hypertension: Secondary | ICD-10-CM | POA: Diagnosis not present

## 2020-08-08 DIAGNOSIS — G250 Essential tremor: Secondary | ICD-10-CM | POA: Diagnosis not present

## 2020-08-17 DIAGNOSIS — M170 Bilateral primary osteoarthritis of knee: Secondary | ICD-10-CM | POA: Diagnosis not present

## 2020-08-26 DIAGNOSIS — K219 Gastro-esophageal reflux disease without esophagitis: Secondary | ICD-10-CM | POA: Diagnosis not present

## 2020-08-26 DIAGNOSIS — R69 Illness, unspecified: Secondary | ICD-10-CM | POA: Diagnosis not present

## 2020-08-26 DIAGNOSIS — I10 Essential (primary) hypertension: Secondary | ICD-10-CM | POA: Diagnosis not present

## 2020-08-26 DIAGNOSIS — I480 Paroxysmal atrial fibrillation: Secondary | ICD-10-CM | POA: Diagnosis not present

## 2020-09-08 DIAGNOSIS — R29898 Other symptoms and signs involving the musculoskeletal system: Secondary | ICD-10-CM | POA: Diagnosis not present

## 2020-09-08 DIAGNOSIS — G250 Essential tremor: Secondary | ICD-10-CM | POA: Diagnosis not present

## 2020-09-08 DIAGNOSIS — Z6826 Body mass index (BMI) 26.0-26.9, adult: Secondary | ICD-10-CM | POA: Diagnosis not present

## 2020-09-08 DIAGNOSIS — E663 Overweight: Secondary | ICD-10-CM | POA: Diagnosis not present

## 2020-09-08 DIAGNOSIS — R69 Illness, unspecified: Secondary | ICD-10-CM | POA: Diagnosis not present

## 2020-10-13 DIAGNOSIS — Z01419 Encounter for gynecological examination (general) (routine) without abnormal findings: Secondary | ICD-10-CM | POA: Diagnosis not present

## 2020-10-13 DIAGNOSIS — L900 Lichen sclerosus et atrophicus: Secondary | ICD-10-CM | POA: Diagnosis not present

## 2020-10-13 DIAGNOSIS — Z1231 Encounter for screening mammogram for malignant neoplasm of breast: Secondary | ICD-10-CM | POA: Diagnosis not present

## 2020-10-13 DIAGNOSIS — C4491 Basal cell carcinoma of skin, unspecified: Secondary | ICD-10-CM | POA: Diagnosis not present

## 2020-10-16 DIAGNOSIS — M170 Bilateral primary osteoarthritis of knee: Secondary | ICD-10-CM | POA: Diagnosis not present

## 2021-05-01 ENCOUNTER — Encounter: Payer: Self-pay | Admitting: Internal Medicine

## 2021-05-17 DIAGNOSIS — J069 Acute upper respiratory infection, unspecified: Secondary | ICD-10-CM | POA: Diagnosis not present

## 2021-05-21 DIAGNOSIS — I10 Essential (primary) hypertension: Secondary | ICD-10-CM | POA: Diagnosis not present

## 2021-05-21 DIAGNOSIS — G250 Essential tremor: Secondary | ICD-10-CM | POA: Diagnosis not present

## 2021-05-21 DIAGNOSIS — T43226A Underdosing of selective serotonin reuptake inhibitors, initial encounter: Secondary | ICD-10-CM | POA: Diagnosis not present

## 2021-05-21 DIAGNOSIS — D6869 Other thrombophilia: Secondary | ICD-10-CM | POA: Diagnosis not present

## 2021-05-21 DIAGNOSIS — I480 Paroxysmal atrial fibrillation: Secondary | ICD-10-CM | POA: Diagnosis not present

## 2021-05-28 DIAGNOSIS — C4491 Basal cell carcinoma of skin, unspecified: Secondary | ICD-10-CM | POA: Diagnosis not present

## 2021-05-30 DIAGNOSIS — M170 Bilateral primary osteoarthritis of knee: Secondary | ICD-10-CM | POA: Diagnosis not present

## 2021-05-30 DIAGNOSIS — M25562 Pain in left knee: Secondary | ICD-10-CM | POA: Diagnosis not present

## 2021-05-30 DIAGNOSIS — M25561 Pain in right knee: Secondary | ICD-10-CM | POA: Diagnosis not present

## 2021-05-31 DIAGNOSIS — R29818 Other symptoms and signs involving the nervous system: Secondary | ICD-10-CM | POA: Diagnosis not present

## 2021-05-31 DIAGNOSIS — G250 Essential tremor: Secondary | ICD-10-CM | POA: Diagnosis not present

## 2021-05-31 DIAGNOSIS — I10 Essential (primary) hypertension: Secondary | ICD-10-CM | POA: Diagnosis not present

## 2021-05-31 DIAGNOSIS — M542 Cervicalgia: Secondary | ICD-10-CM | POA: Diagnosis not present

## 2021-05-31 DIAGNOSIS — M48061 Spinal stenosis, lumbar region without neurogenic claudication: Secondary | ICD-10-CM | POA: Diagnosis not present

## 2021-06-06 DIAGNOSIS — R29818 Other symptoms and signs involving the nervous system: Secondary | ICD-10-CM | POA: Diagnosis not present

## 2021-06-06 DIAGNOSIS — R9082 White matter disease, unspecified: Secondary | ICD-10-CM | POA: Diagnosis not present

## 2021-06-06 DIAGNOSIS — M47812 Spondylosis without myelopathy or radiculopathy, cervical region: Secondary | ICD-10-CM | POA: Diagnosis not present

## 2021-06-06 DIAGNOSIS — M542 Cervicalgia: Secondary | ICD-10-CM | POA: Diagnosis not present

## 2021-06-06 DIAGNOSIS — R296 Repeated falls: Secondary | ICD-10-CM | POA: Diagnosis not present

## 2021-06-06 DIAGNOSIS — Z9889 Other specified postprocedural states: Secondary | ICD-10-CM | POA: Diagnosis not present

## 2021-06-22 DIAGNOSIS — H35363 Drusen (degenerative) of macula, bilateral: Secondary | ICD-10-CM | POA: Diagnosis not present

## 2021-06-22 DIAGNOSIS — H0265 Xanthelasma of left lower eyelid: Secondary | ICD-10-CM | POA: Diagnosis not present

## 2021-06-22 DIAGNOSIS — H40013 Open angle with borderline findings, low risk, bilateral: Secondary | ICD-10-CM | POA: Diagnosis not present

## 2021-06-22 DIAGNOSIS — H04123 Dry eye syndrome of bilateral lacrimal glands: Secondary | ICD-10-CM | POA: Diagnosis not present

## 2021-06-27 DIAGNOSIS — R296 Repeated falls: Secondary | ICD-10-CM | POA: Diagnosis not present

## 2021-06-27 DIAGNOSIS — I480 Paroxysmal atrial fibrillation: Secondary | ICD-10-CM | POA: Diagnosis not present

## 2021-06-27 DIAGNOSIS — M6281 Muscle weakness (generalized): Secondary | ICD-10-CM | POA: Diagnosis not present

## 2021-06-27 DIAGNOSIS — D6869 Other thrombophilia: Secondary | ICD-10-CM | POA: Diagnosis not present

## 2021-06-27 DIAGNOSIS — R2681 Unsteadiness on feet: Secondary | ICD-10-CM | POA: Diagnosis not present

## 2021-06-28 DIAGNOSIS — I480 Paroxysmal atrial fibrillation: Secondary | ICD-10-CM | POA: Diagnosis not present

## 2021-06-28 DIAGNOSIS — Z856 Personal history of leukemia: Secondary | ICD-10-CM | POA: Diagnosis not present

## 2021-06-28 DIAGNOSIS — M170 Bilateral primary osteoarthritis of knee: Secondary | ICD-10-CM | POA: Diagnosis not present

## 2021-06-28 DIAGNOSIS — Z85828 Personal history of other malignant neoplasm of skin: Secondary | ICD-10-CM | POA: Diagnosis not present

## 2021-06-28 DIAGNOSIS — K2960 Other gastritis without bleeding: Secondary | ICD-10-CM | POA: Diagnosis not present

## 2021-06-28 DIAGNOSIS — M48061 Spinal stenosis, lumbar region without neurogenic claudication: Secondary | ICD-10-CM | POA: Diagnosis not present

## 2021-06-28 DIAGNOSIS — E559 Vitamin D deficiency, unspecified: Secondary | ICD-10-CM | POA: Diagnosis not present

## 2021-06-28 DIAGNOSIS — Z9181 History of falling: Secondary | ICD-10-CM | POA: Diagnosis not present

## 2021-06-28 DIAGNOSIS — G250 Essential tremor: Secondary | ICD-10-CM | POA: Diagnosis not present

## 2021-06-28 DIAGNOSIS — M8580 Other specified disorders of bone density and structure, unspecified site: Secondary | ICD-10-CM | POA: Diagnosis not present

## 2021-06-28 DIAGNOSIS — D6869 Other thrombophilia: Secondary | ICD-10-CM | POA: Diagnosis not present

## 2021-06-28 DIAGNOSIS — Z7982 Long term (current) use of aspirin: Secondary | ICD-10-CM | POA: Diagnosis not present

## 2021-07-03 DIAGNOSIS — Z9181 History of falling: Secondary | ICD-10-CM | POA: Diagnosis not present

## 2021-07-03 DIAGNOSIS — I480 Paroxysmal atrial fibrillation: Secondary | ICD-10-CM | POA: Diagnosis not present

## 2021-07-03 DIAGNOSIS — M8580 Other specified disorders of bone density and structure, unspecified site: Secondary | ICD-10-CM | POA: Diagnosis not present

## 2021-07-03 DIAGNOSIS — Z7982 Long term (current) use of aspirin: Secondary | ICD-10-CM | POA: Diagnosis not present

## 2021-07-03 DIAGNOSIS — M48061 Spinal stenosis, lumbar region without neurogenic claudication: Secondary | ICD-10-CM | POA: Diagnosis not present

## 2021-07-03 DIAGNOSIS — K2960 Other gastritis without bleeding: Secondary | ICD-10-CM | POA: Diagnosis not present

## 2021-07-03 DIAGNOSIS — G250 Essential tremor: Secondary | ICD-10-CM | POA: Diagnosis not present

## 2021-07-03 DIAGNOSIS — Z856 Personal history of leukemia: Secondary | ICD-10-CM | POA: Diagnosis not present

## 2021-07-03 DIAGNOSIS — Z85828 Personal history of other malignant neoplasm of skin: Secondary | ICD-10-CM | POA: Diagnosis not present

## 2021-07-03 DIAGNOSIS — M170 Bilateral primary osteoarthritis of knee: Secondary | ICD-10-CM | POA: Diagnosis not present

## 2021-07-03 DIAGNOSIS — E559 Vitamin D deficiency, unspecified: Secondary | ICD-10-CM | POA: Diagnosis not present

## 2021-07-03 DIAGNOSIS — D6869 Other thrombophilia: Secondary | ICD-10-CM | POA: Diagnosis not present

## 2021-07-04 DIAGNOSIS — M48061 Spinal stenosis, lumbar region without neurogenic claudication: Secondary | ICD-10-CM | POA: Diagnosis not present

## 2021-07-04 DIAGNOSIS — Z856 Personal history of leukemia: Secondary | ICD-10-CM | POA: Diagnosis not present

## 2021-07-04 DIAGNOSIS — M170 Bilateral primary osteoarthritis of knee: Secondary | ICD-10-CM | POA: Diagnosis not present

## 2021-07-04 DIAGNOSIS — M8580 Other specified disorders of bone density and structure, unspecified site: Secondary | ICD-10-CM | POA: Diagnosis not present

## 2021-07-04 DIAGNOSIS — G250 Essential tremor: Secondary | ICD-10-CM | POA: Diagnosis not present

## 2021-07-04 DIAGNOSIS — D6869 Other thrombophilia: Secondary | ICD-10-CM | POA: Diagnosis not present

## 2021-07-04 DIAGNOSIS — Z9181 History of falling: Secondary | ICD-10-CM | POA: Diagnosis not present

## 2021-07-04 DIAGNOSIS — Z7982 Long term (current) use of aspirin: Secondary | ICD-10-CM | POA: Diagnosis not present

## 2021-07-04 DIAGNOSIS — E559 Vitamin D deficiency, unspecified: Secondary | ICD-10-CM | POA: Diagnosis not present

## 2021-07-04 DIAGNOSIS — K2960 Other gastritis without bleeding: Secondary | ICD-10-CM | POA: Diagnosis not present

## 2021-07-04 DIAGNOSIS — I480 Paroxysmal atrial fibrillation: Secondary | ICD-10-CM | POA: Diagnosis not present

## 2021-07-04 DIAGNOSIS — Z85828 Personal history of other malignant neoplasm of skin: Secondary | ICD-10-CM | POA: Diagnosis not present

## 2021-07-05 DIAGNOSIS — D6869 Other thrombophilia: Secondary | ICD-10-CM | POA: Diagnosis not present

## 2021-07-05 DIAGNOSIS — Z7982 Long term (current) use of aspirin: Secondary | ICD-10-CM | POA: Diagnosis not present

## 2021-07-05 DIAGNOSIS — I480 Paroxysmal atrial fibrillation: Secondary | ICD-10-CM | POA: Diagnosis not present

## 2021-07-05 DIAGNOSIS — M8580 Other specified disorders of bone density and structure, unspecified site: Secondary | ICD-10-CM | POA: Diagnosis not present

## 2021-07-05 DIAGNOSIS — Z85828 Personal history of other malignant neoplasm of skin: Secondary | ICD-10-CM | POA: Diagnosis not present

## 2021-07-05 DIAGNOSIS — E559 Vitamin D deficiency, unspecified: Secondary | ICD-10-CM | POA: Diagnosis not present

## 2021-07-05 DIAGNOSIS — Z9181 History of falling: Secondary | ICD-10-CM | POA: Diagnosis not present

## 2021-07-05 DIAGNOSIS — Z856 Personal history of leukemia: Secondary | ICD-10-CM | POA: Diagnosis not present

## 2021-07-05 DIAGNOSIS — G250 Essential tremor: Secondary | ICD-10-CM | POA: Diagnosis not present

## 2021-07-05 DIAGNOSIS — M170 Bilateral primary osteoarthritis of knee: Secondary | ICD-10-CM | POA: Diagnosis not present

## 2021-07-05 DIAGNOSIS — K2960 Other gastritis without bleeding: Secondary | ICD-10-CM | POA: Diagnosis not present

## 2021-07-05 DIAGNOSIS — M48061 Spinal stenosis, lumbar region without neurogenic claudication: Secondary | ICD-10-CM | POA: Diagnosis not present

## 2021-07-09 DIAGNOSIS — M48061 Spinal stenosis, lumbar region without neurogenic claudication: Secondary | ICD-10-CM | POA: Diagnosis not present

## 2021-07-09 DIAGNOSIS — D6869 Other thrombophilia: Secondary | ICD-10-CM | POA: Diagnosis not present

## 2021-07-09 DIAGNOSIS — M8580 Other specified disorders of bone density and structure, unspecified site: Secondary | ICD-10-CM | POA: Diagnosis not present

## 2021-07-09 DIAGNOSIS — I480 Paroxysmal atrial fibrillation: Secondary | ICD-10-CM | POA: Diagnosis not present

## 2021-07-09 DIAGNOSIS — Z9181 History of falling: Secondary | ICD-10-CM | POA: Diagnosis not present

## 2021-07-09 DIAGNOSIS — G250 Essential tremor: Secondary | ICD-10-CM | POA: Diagnosis not present

## 2021-07-09 DIAGNOSIS — Z85828 Personal history of other malignant neoplasm of skin: Secondary | ICD-10-CM | POA: Diagnosis not present

## 2021-07-09 DIAGNOSIS — Z856 Personal history of leukemia: Secondary | ICD-10-CM | POA: Diagnosis not present

## 2021-07-09 DIAGNOSIS — M170 Bilateral primary osteoarthritis of knee: Secondary | ICD-10-CM | POA: Diagnosis not present

## 2021-07-09 DIAGNOSIS — Z7982 Long term (current) use of aspirin: Secondary | ICD-10-CM | POA: Diagnosis not present

## 2021-07-09 DIAGNOSIS — E559 Vitamin D deficiency, unspecified: Secondary | ICD-10-CM | POA: Diagnosis not present

## 2021-07-09 DIAGNOSIS — K2960 Other gastritis without bleeding: Secondary | ICD-10-CM | POA: Diagnosis not present

## 2021-07-12 DIAGNOSIS — D6869 Other thrombophilia: Secondary | ICD-10-CM | POA: Diagnosis not present

## 2021-07-12 DIAGNOSIS — M8580 Other specified disorders of bone density and structure, unspecified site: Secondary | ICD-10-CM | POA: Diagnosis not present

## 2021-07-12 DIAGNOSIS — K2960 Other gastritis without bleeding: Secondary | ICD-10-CM | POA: Diagnosis not present

## 2021-07-12 DIAGNOSIS — G250 Essential tremor: Secondary | ICD-10-CM | POA: Diagnosis not present

## 2021-07-12 DIAGNOSIS — Z9181 History of falling: Secondary | ICD-10-CM | POA: Diagnosis not present

## 2021-07-12 DIAGNOSIS — Z85828 Personal history of other malignant neoplasm of skin: Secondary | ICD-10-CM | POA: Diagnosis not present

## 2021-07-12 DIAGNOSIS — I480 Paroxysmal atrial fibrillation: Secondary | ICD-10-CM | POA: Diagnosis not present

## 2021-07-12 DIAGNOSIS — M170 Bilateral primary osteoarthritis of knee: Secondary | ICD-10-CM | POA: Diagnosis not present

## 2021-07-12 DIAGNOSIS — Z7982 Long term (current) use of aspirin: Secondary | ICD-10-CM | POA: Diagnosis not present

## 2021-07-12 DIAGNOSIS — M48061 Spinal stenosis, lumbar region without neurogenic claudication: Secondary | ICD-10-CM | POA: Diagnosis not present

## 2021-07-12 DIAGNOSIS — Z856 Personal history of leukemia: Secondary | ICD-10-CM | POA: Diagnosis not present

## 2021-07-12 DIAGNOSIS — E559 Vitamin D deficiency, unspecified: Secondary | ICD-10-CM | POA: Diagnosis not present

## 2021-07-13 DIAGNOSIS — M170 Bilateral primary osteoarthritis of knee: Secondary | ICD-10-CM | POA: Diagnosis not present

## 2021-07-16 DIAGNOSIS — K2960 Other gastritis without bleeding: Secondary | ICD-10-CM | POA: Diagnosis not present

## 2021-07-16 DIAGNOSIS — Z85828 Personal history of other malignant neoplasm of skin: Secondary | ICD-10-CM | POA: Diagnosis not present

## 2021-07-16 DIAGNOSIS — Z856 Personal history of leukemia: Secondary | ICD-10-CM | POA: Diagnosis not present

## 2021-07-16 DIAGNOSIS — E559 Vitamin D deficiency, unspecified: Secondary | ICD-10-CM | POA: Diagnosis not present

## 2021-07-16 DIAGNOSIS — Z9181 History of falling: Secondary | ICD-10-CM | POA: Diagnosis not present

## 2021-07-16 DIAGNOSIS — M8580 Other specified disorders of bone density and structure, unspecified site: Secondary | ICD-10-CM | POA: Diagnosis not present

## 2021-07-16 DIAGNOSIS — M48061 Spinal stenosis, lumbar region without neurogenic claudication: Secondary | ICD-10-CM | POA: Diagnosis not present

## 2021-07-16 DIAGNOSIS — M170 Bilateral primary osteoarthritis of knee: Secondary | ICD-10-CM | POA: Diagnosis not present

## 2021-07-16 DIAGNOSIS — D6869 Other thrombophilia: Secondary | ICD-10-CM | POA: Diagnosis not present

## 2021-07-16 DIAGNOSIS — G250 Essential tremor: Secondary | ICD-10-CM | POA: Diagnosis not present

## 2021-07-16 DIAGNOSIS — Z7982 Long term (current) use of aspirin: Secondary | ICD-10-CM | POA: Diagnosis not present

## 2021-07-16 DIAGNOSIS — I480 Paroxysmal atrial fibrillation: Secondary | ICD-10-CM | POA: Diagnosis not present

## 2021-07-17 DIAGNOSIS — E559 Vitamin D deficiency, unspecified: Secondary | ICD-10-CM | POA: Diagnosis not present

## 2021-07-17 DIAGNOSIS — K2960 Other gastritis without bleeding: Secondary | ICD-10-CM | POA: Diagnosis not present

## 2021-07-17 DIAGNOSIS — G250 Essential tremor: Secondary | ICD-10-CM | POA: Diagnosis not present

## 2021-07-17 DIAGNOSIS — M8580 Other specified disorders of bone density and structure, unspecified site: Secondary | ICD-10-CM | POA: Diagnosis not present

## 2021-07-17 DIAGNOSIS — I480 Paroxysmal atrial fibrillation: Secondary | ICD-10-CM | POA: Diagnosis not present

## 2021-07-17 DIAGNOSIS — M48061 Spinal stenosis, lumbar region without neurogenic claudication: Secondary | ICD-10-CM | POA: Diagnosis not present

## 2021-07-17 DIAGNOSIS — Z7982 Long term (current) use of aspirin: Secondary | ICD-10-CM | POA: Diagnosis not present

## 2021-07-17 DIAGNOSIS — D6869 Other thrombophilia: Secondary | ICD-10-CM | POA: Diagnosis not present

## 2021-07-17 DIAGNOSIS — Z856 Personal history of leukemia: Secondary | ICD-10-CM | POA: Diagnosis not present

## 2021-07-17 DIAGNOSIS — Z9181 History of falling: Secondary | ICD-10-CM | POA: Diagnosis not present

## 2021-07-17 DIAGNOSIS — M170 Bilateral primary osteoarthritis of knee: Secondary | ICD-10-CM | POA: Diagnosis not present

## 2021-07-17 DIAGNOSIS — Z85828 Personal history of other malignant neoplasm of skin: Secondary | ICD-10-CM | POA: Diagnosis not present

## 2021-07-18 DIAGNOSIS — D6869 Other thrombophilia: Secondary | ICD-10-CM | POA: Diagnosis not present

## 2021-07-18 DIAGNOSIS — E559 Vitamin D deficiency, unspecified: Secondary | ICD-10-CM | POA: Diagnosis not present

## 2021-07-18 DIAGNOSIS — M170 Bilateral primary osteoarthritis of knee: Secondary | ICD-10-CM | POA: Diagnosis not present

## 2021-07-18 DIAGNOSIS — M8580 Other specified disorders of bone density and structure, unspecified site: Secondary | ICD-10-CM | POA: Diagnosis not present

## 2021-07-18 DIAGNOSIS — Z7982 Long term (current) use of aspirin: Secondary | ICD-10-CM | POA: Diagnosis not present

## 2021-07-18 DIAGNOSIS — K2960 Other gastritis without bleeding: Secondary | ICD-10-CM | POA: Diagnosis not present

## 2021-07-18 DIAGNOSIS — Z85828 Personal history of other malignant neoplasm of skin: Secondary | ICD-10-CM | POA: Diagnosis not present

## 2021-07-18 DIAGNOSIS — Z856 Personal history of leukemia: Secondary | ICD-10-CM | POA: Diagnosis not present

## 2021-07-18 DIAGNOSIS — M48061 Spinal stenosis, lumbar region without neurogenic claudication: Secondary | ICD-10-CM | POA: Diagnosis not present

## 2021-07-18 DIAGNOSIS — Z9181 History of falling: Secondary | ICD-10-CM | POA: Diagnosis not present

## 2021-07-18 DIAGNOSIS — G250 Essential tremor: Secondary | ICD-10-CM | POA: Diagnosis not present

## 2021-07-18 DIAGNOSIS — I480 Paroxysmal atrial fibrillation: Secondary | ICD-10-CM | POA: Diagnosis not present

## 2021-07-23 DIAGNOSIS — D6869 Other thrombophilia: Secondary | ICD-10-CM | POA: Diagnosis not present

## 2021-07-23 DIAGNOSIS — K2960 Other gastritis without bleeding: Secondary | ICD-10-CM | POA: Diagnosis not present

## 2021-07-23 DIAGNOSIS — M170 Bilateral primary osteoarthritis of knee: Secondary | ICD-10-CM | POA: Diagnosis not present

## 2021-07-23 DIAGNOSIS — I480 Paroxysmal atrial fibrillation: Secondary | ICD-10-CM | POA: Diagnosis not present

## 2021-07-23 DIAGNOSIS — Z856 Personal history of leukemia: Secondary | ICD-10-CM | POA: Diagnosis not present

## 2021-07-23 DIAGNOSIS — E559 Vitamin D deficiency, unspecified: Secondary | ICD-10-CM | POA: Diagnosis not present

## 2021-07-23 DIAGNOSIS — M48061 Spinal stenosis, lumbar region without neurogenic claudication: Secondary | ICD-10-CM | POA: Diagnosis not present

## 2021-07-23 DIAGNOSIS — Z7982 Long term (current) use of aspirin: Secondary | ICD-10-CM | POA: Diagnosis not present

## 2021-07-23 DIAGNOSIS — M8580 Other specified disorders of bone density and structure, unspecified site: Secondary | ICD-10-CM | POA: Diagnosis not present

## 2021-07-23 DIAGNOSIS — G250 Essential tremor: Secondary | ICD-10-CM | POA: Diagnosis not present

## 2021-07-23 DIAGNOSIS — Z85828 Personal history of other malignant neoplasm of skin: Secondary | ICD-10-CM | POA: Diagnosis not present

## 2021-07-23 DIAGNOSIS — Z9181 History of falling: Secondary | ICD-10-CM | POA: Diagnosis not present

## 2021-07-24 DIAGNOSIS — Z9181 History of falling: Secondary | ICD-10-CM | POA: Diagnosis not present

## 2021-07-24 DIAGNOSIS — M8580 Other specified disorders of bone density and structure, unspecified site: Secondary | ICD-10-CM | POA: Diagnosis not present

## 2021-07-24 DIAGNOSIS — Z856 Personal history of leukemia: Secondary | ICD-10-CM | POA: Diagnosis not present

## 2021-07-24 DIAGNOSIS — M48061 Spinal stenosis, lumbar region without neurogenic claudication: Secondary | ICD-10-CM | POA: Diagnosis not present

## 2021-07-24 DIAGNOSIS — Z7982 Long term (current) use of aspirin: Secondary | ICD-10-CM | POA: Diagnosis not present

## 2021-07-24 DIAGNOSIS — K2960 Other gastritis without bleeding: Secondary | ICD-10-CM | POA: Diagnosis not present

## 2021-07-24 DIAGNOSIS — Z85828 Personal history of other malignant neoplasm of skin: Secondary | ICD-10-CM | POA: Diagnosis not present

## 2021-07-24 DIAGNOSIS — M170 Bilateral primary osteoarthritis of knee: Secondary | ICD-10-CM | POA: Diagnosis not present

## 2021-07-24 DIAGNOSIS — I480 Paroxysmal atrial fibrillation: Secondary | ICD-10-CM | POA: Diagnosis not present

## 2021-07-24 DIAGNOSIS — D6869 Other thrombophilia: Secondary | ICD-10-CM | POA: Diagnosis not present

## 2021-07-24 DIAGNOSIS — E559 Vitamin D deficiency, unspecified: Secondary | ICD-10-CM | POA: Diagnosis not present

## 2021-07-24 DIAGNOSIS — G250 Essential tremor: Secondary | ICD-10-CM | POA: Diagnosis not present

## 2021-07-31 DIAGNOSIS — B882 Other arthropod infestations: Secondary | ICD-10-CM | POA: Diagnosis not present

## 2021-07-31 DIAGNOSIS — M7918 Myalgia, other site: Secondary | ICD-10-CM | POA: Diagnosis not present

## 2021-08-01 DIAGNOSIS — M8580 Other specified disorders of bone density and structure, unspecified site: Secondary | ICD-10-CM | POA: Diagnosis not present

## 2021-08-01 DIAGNOSIS — Z856 Personal history of leukemia: Secondary | ICD-10-CM | POA: Diagnosis not present

## 2021-08-01 DIAGNOSIS — Z7982 Long term (current) use of aspirin: Secondary | ICD-10-CM | POA: Diagnosis not present

## 2021-08-01 DIAGNOSIS — D6869 Other thrombophilia: Secondary | ICD-10-CM | POA: Diagnosis not present

## 2021-08-01 DIAGNOSIS — I480 Paroxysmal atrial fibrillation: Secondary | ICD-10-CM | POA: Diagnosis not present

## 2021-08-01 DIAGNOSIS — K2960 Other gastritis without bleeding: Secondary | ICD-10-CM | POA: Diagnosis not present

## 2021-08-01 DIAGNOSIS — M170 Bilateral primary osteoarthritis of knee: Secondary | ICD-10-CM | POA: Diagnosis not present

## 2021-08-01 DIAGNOSIS — Z9181 History of falling: Secondary | ICD-10-CM | POA: Diagnosis not present

## 2021-08-01 DIAGNOSIS — M48061 Spinal stenosis, lumbar region without neurogenic claudication: Secondary | ICD-10-CM | POA: Diagnosis not present

## 2021-08-01 DIAGNOSIS — G250 Essential tremor: Secondary | ICD-10-CM | POA: Diagnosis not present

## 2021-08-01 DIAGNOSIS — Z85828 Personal history of other malignant neoplasm of skin: Secondary | ICD-10-CM | POA: Diagnosis not present

## 2021-08-01 DIAGNOSIS — E559 Vitamin D deficiency, unspecified: Secondary | ICD-10-CM | POA: Diagnosis not present

## 2021-08-03 DIAGNOSIS — Z20822 Contact with and (suspected) exposure to covid-19: Secondary | ICD-10-CM | POA: Diagnosis not present

## 2021-08-03 DIAGNOSIS — M1990 Unspecified osteoarthritis, unspecified site: Secondary | ICD-10-CM | POA: Diagnosis not present

## 2021-08-03 DIAGNOSIS — R5383 Other fatigue: Secondary | ICD-10-CM | POA: Diagnosis not present

## 2021-08-03 DIAGNOSIS — R5381 Other malaise: Secondary | ICD-10-CM | POA: Diagnosis not present

## 2021-08-06 DIAGNOSIS — M8580 Other specified disorders of bone density and structure, unspecified site: Secondary | ICD-10-CM | POA: Diagnosis not present

## 2021-08-06 DIAGNOSIS — M48061 Spinal stenosis, lumbar region without neurogenic claudication: Secondary | ICD-10-CM | POA: Diagnosis not present

## 2021-08-06 DIAGNOSIS — G250 Essential tremor: Secondary | ICD-10-CM | POA: Diagnosis not present

## 2021-08-06 DIAGNOSIS — I480 Paroxysmal atrial fibrillation: Secondary | ICD-10-CM | POA: Diagnosis not present

## 2021-08-06 DIAGNOSIS — Z85828 Personal history of other malignant neoplasm of skin: Secondary | ICD-10-CM | POA: Diagnosis not present

## 2021-08-06 DIAGNOSIS — Z9181 History of falling: Secondary | ICD-10-CM | POA: Diagnosis not present

## 2021-08-06 DIAGNOSIS — E559 Vitamin D deficiency, unspecified: Secondary | ICD-10-CM | POA: Diagnosis not present

## 2021-08-06 DIAGNOSIS — Z7982 Long term (current) use of aspirin: Secondary | ICD-10-CM | POA: Diagnosis not present

## 2021-08-06 DIAGNOSIS — K2960 Other gastritis without bleeding: Secondary | ICD-10-CM | POA: Diagnosis not present

## 2021-08-06 DIAGNOSIS — D6869 Other thrombophilia: Secondary | ICD-10-CM | POA: Diagnosis not present

## 2021-08-06 DIAGNOSIS — Z856 Personal history of leukemia: Secondary | ICD-10-CM | POA: Diagnosis not present

## 2021-08-06 DIAGNOSIS — M170 Bilateral primary osteoarthritis of knee: Secondary | ICD-10-CM | POA: Diagnosis not present

## 2021-08-08 DIAGNOSIS — B882 Other arthropod infestations: Secondary | ICD-10-CM | POA: Diagnosis not present

## 2021-08-08 DIAGNOSIS — M7918 Myalgia, other site: Secondary | ICD-10-CM | POA: Diagnosis not present

## 2021-08-11 DIAGNOSIS — R0602 Shortness of breath: Secondary | ICD-10-CM | POA: Diagnosis not present

## 2021-08-11 DIAGNOSIS — I10 Essential (primary) hypertension: Secondary | ICD-10-CM | POA: Diagnosis not present

## 2021-08-11 DIAGNOSIS — R0902 Hypoxemia: Secondary | ICD-10-CM | POA: Diagnosis not present

## 2021-08-11 DIAGNOSIS — U071 COVID-19: Secondary | ICD-10-CM | POA: Diagnosis not present

## 2021-08-11 DIAGNOSIS — Z79899 Other long term (current) drug therapy: Secondary | ICD-10-CM | POA: Diagnosis not present

## 2021-08-11 DIAGNOSIS — Z743 Need for continuous supervision: Secondary | ICD-10-CM | POA: Diagnosis not present

## 2021-08-11 DIAGNOSIS — Z7982 Long term (current) use of aspirin: Secondary | ICD-10-CM | POA: Diagnosis not present

## 2021-08-11 DIAGNOSIS — R531 Weakness: Secondary | ICD-10-CM | POA: Diagnosis not present

## 2021-08-12 DIAGNOSIS — R531 Weakness: Secondary | ICD-10-CM | POA: Diagnosis not present

## 2021-08-12 DIAGNOSIS — U071 COVID-19: Secondary | ICD-10-CM | POA: Diagnosis not present

## 2021-08-12 DIAGNOSIS — I10 Essential (primary) hypertension: Secondary | ICD-10-CM | POA: Diagnosis not present

## 2021-08-16 DIAGNOSIS — M8580 Other specified disorders of bone density and structure, unspecified site: Secondary | ICD-10-CM | POA: Diagnosis not present

## 2021-08-16 DIAGNOSIS — Z9181 History of falling: Secondary | ICD-10-CM | POA: Diagnosis not present

## 2021-08-16 DIAGNOSIS — M48061 Spinal stenosis, lumbar region without neurogenic claudication: Secondary | ICD-10-CM | POA: Diagnosis not present

## 2021-08-16 DIAGNOSIS — Z85828 Personal history of other malignant neoplasm of skin: Secondary | ICD-10-CM | POA: Diagnosis not present

## 2021-08-16 DIAGNOSIS — I480 Paroxysmal atrial fibrillation: Secondary | ICD-10-CM | POA: Diagnosis not present

## 2021-08-16 DIAGNOSIS — Z7982 Long term (current) use of aspirin: Secondary | ICD-10-CM | POA: Diagnosis not present

## 2021-08-16 DIAGNOSIS — M170 Bilateral primary osteoarthritis of knee: Secondary | ICD-10-CM | POA: Diagnosis not present

## 2021-08-16 DIAGNOSIS — K2960 Other gastritis without bleeding: Secondary | ICD-10-CM | POA: Diagnosis not present

## 2021-08-16 DIAGNOSIS — G250 Essential tremor: Secondary | ICD-10-CM | POA: Diagnosis not present

## 2021-08-16 DIAGNOSIS — E559 Vitamin D deficiency, unspecified: Secondary | ICD-10-CM | POA: Diagnosis not present

## 2021-08-16 DIAGNOSIS — Z856 Personal history of leukemia: Secondary | ICD-10-CM | POA: Diagnosis not present

## 2021-08-16 DIAGNOSIS — D6869 Other thrombophilia: Secondary | ICD-10-CM | POA: Diagnosis not present

## 2021-08-23 DIAGNOSIS — D6869 Other thrombophilia: Secondary | ICD-10-CM | POA: Diagnosis not present

## 2021-08-23 DIAGNOSIS — M170 Bilateral primary osteoarthritis of knee: Secondary | ICD-10-CM | POA: Diagnosis not present

## 2021-08-23 DIAGNOSIS — Z856 Personal history of leukemia: Secondary | ICD-10-CM | POA: Diagnosis not present

## 2021-08-23 DIAGNOSIS — M48061 Spinal stenosis, lumbar region without neurogenic claudication: Secondary | ICD-10-CM | POA: Diagnosis not present

## 2021-08-23 DIAGNOSIS — E559 Vitamin D deficiency, unspecified: Secondary | ICD-10-CM | POA: Diagnosis not present

## 2021-08-23 DIAGNOSIS — Z85828 Personal history of other malignant neoplasm of skin: Secondary | ICD-10-CM | POA: Diagnosis not present

## 2021-08-23 DIAGNOSIS — I480 Paroxysmal atrial fibrillation: Secondary | ICD-10-CM | POA: Diagnosis not present

## 2021-08-23 DIAGNOSIS — M8580 Other specified disorders of bone density and structure, unspecified site: Secondary | ICD-10-CM | POA: Diagnosis not present

## 2021-08-23 DIAGNOSIS — G250 Essential tremor: Secondary | ICD-10-CM | POA: Diagnosis not present

## 2021-08-23 DIAGNOSIS — K2960 Other gastritis without bleeding: Secondary | ICD-10-CM | POA: Diagnosis not present

## 2021-08-23 DIAGNOSIS — Z7982 Long term (current) use of aspirin: Secondary | ICD-10-CM | POA: Diagnosis not present

## 2021-08-23 DIAGNOSIS — Z9181 History of falling: Secondary | ICD-10-CM | POA: Diagnosis not present

## 2021-08-27 DIAGNOSIS — I480 Paroxysmal atrial fibrillation: Secondary | ICD-10-CM | POA: Diagnosis not present

## 2021-08-27 DIAGNOSIS — I10 Essential (primary) hypertension: Secondary | ICD-10-CM | POA: Diagnosis not present

## 2021-08-27 DIAGNOSIS — D6869 Other thrombophilia: Secondary | ICD-10-CM | POA: Diagnosis not present

## 2021-08-27 DIAGNOSIS — D692 Other nonthrombocytopenic purpura: Secondary | ICD-10-CM | POA: Diagnosis not present

## 2021-08-27 DIAGNOSIS — M6281 Muscle weakness (generalized): Secondary | ICD-10-CM | POA: Diagnosis not present

## 2021-08-27 DIAGNOSIS — R296 Repeated falls: Secondary | ICD-10-CM | POA: Diagnosis not present

## 2021-08-27 DIAGNOSIS — E876 Hypokalemia: Secondary | ICD-10-CM | POA: Diagnosis not present

## 2021-08-28 DIAGNOSIS — Z9181 History of falling: Secondary | ICD-10-CM | POA: Diagnosis not present

## 2021-08-28 DIAGNOSIS — G250 Essential tremor: Secondary | ICD-10-CM | POA: Diagnosis not present

## 2021-08-28 DIAGNOSIS — Z7982 Long term (current) use of aspirin: Secondary | ICD-10-CM | POA: Diagnosis not present

## 2021-08-28 DIAGNOSIS — Z85828 Personal history of other malignant neoplasm of skin: Secondary | ICD-10-CM | POA: Diagnosis not present

## 2021-08-28 DIAGNOSIS — M8580 Other specified disorders of bone density and structure, unspecified site: Secondary | ICD-10-CM | POA: Diagnosis not present

## 2021-08-28 DIAGNOSIS — E559 Vitamin D deficiency, unspecified: Secondary | ICD-10-CM | POA: Diagnosis not present

## 2021-08-28 DIAGNOSIS — Z856 Personal history of leukemia: Secondary | ICD-10-CM | POA: Diagnosis not present

## 2021-08-28 DIAGNOSIS — K2960 Other gastritis without bleeding: Secondary | ICD-10-CM | POA: Diagnosis not present

## 2021-08-28 DIAGNOSIS — I480 Paroxysmal atrial fibrillation: Secondary | ICD-10-CM | POA: Diagnosis not present

## 2021-08-28 DIAGNOSIS — M170 Bilateral primary osteoarthritis of knee: Secondary | ICD-10-CM | POA: Diagnosis not present

## 2021-08-28 DIAGNOSIS — M48061 Spinal stenosis, lumbar region without neurogenic claudication: Secondary | ICD-10-CM | POA: Diagnosis not present

## 2021-08-28 DIAGNOSIS — D6869 Other thrombophilia: Secondary | ICD-10-CM | POA: Diagnosis not present

## 2021-08-29 DIAGNOSIS — M170 Bilateral primary osteoarthritis of knee: Secondary | ICD-10-CM | POA: Diagnosis not present

## 2021-09-04 DIAGNOSIS — M48061 Spinal stenosis, lumbar region without neurogenic claudication: Secondary | ICD-10-CM | POA: Diagnosis not present

## 2021-09-04 DIAGNOSIS — M170 Bilateral primary osteoarthritis of knee: Secondary | ICD-10-CM | POA: Diagnosis not present

## 2021-09-04 DIAGNOSIS — Z7982 Long term (current) use of aspirin: Secondary | ICD-10-CM | POA: Diagnosis not present

## 2021-09-04 DIAGNOSIS — I480 Paroxysmal atrial fibrillation: Secondary | ICD-10-CM | POA: Diagnosis not present

## 2021-09-04 DIAGNOSIS — Z856 Personal history of leukemia: Secondary | ICD-10-CM | POA: Diagnosis not present

## 2021-09-04 DIAGNOSIS — E559 Vitamin D deficiency, unspecified: Secondary | ICD-10-CM | POA: Diagnosis not present

## 2021-09-04 DIAGNOSIS — D6869 Other thrombophilia: Secondary | ICD-10-CM | POA: Diagnosis not present

## 2021-09-04 DIAGNOSIS — K2960 Other gastritis without bleeding: Secondary | ICD-10-CM | POA: Diagnosis not present

## 2021-09-04 DIAGNOSIS — G250 Essential tremor: Secondary | ICD-10-CM | POA: Diagnosis not present

## 2021-09-04 DIAGNOSIS — M8580 Other specified disorders of bone density and structure, unspecified site: Secondary | ICD-10-CM | POA: Diagnosis not present

## 2021-09-04 DIAGNOSIS — Z85828 Personal history of other malignant neoplasm of skin: Secondary | ICD-10-CM | POA: Diagnosis not present

## 2021-09-04 DIAGNOSIS — Z9181 History of falling: Secondary | ICD-10-CM | POA: Diagnosis not present

## 2021-09-06 DIAGNOSIS — Z23 Encounter for immunization: Secondary | ICD-10-CM | POA: Diagnosis not present

## 2021-09-11 DIAGNOSIS — Z7982 Long term (current) use of aspirin: Secondary | ICD-10-CM | POA: Diagnosis not present

## 2021-09-11 DIAGNOSIS — M8580 Other specified disorders of bone density and structure, unspecified site: Secondary | ICD-10-CM | POA: Diagnosis not present

## 2021-09-11 DIAGNOSIS — E559 Vitamin D deficiency, unspecified: Secondary | ICD-10-CM | POA: Diagnosis not present

## 2021-09-11 DIAGNOSIS — D6869 Other thrombophilia: Secondary | ICD-10-CM | POA: Diagnosis not present

## 2021-09-11 DIAGNOSIS — I480 Paroxysmal atrial fibrillation: Secondary | ICD-10-CM | POA: Diagnosis not present

## 2021-09-11 DIAGNOSIS — M170 Bilateral primary osteoarthritis of knee: Secondary | ICD-10-CM | POA: Diagnosis not present

## 2021-09-11 DIAGNOSIS — M48061 Spinal stenosis, lumbar region without neurogenic claudication: Secondary | ICD-10-CM | POA: Diagnosis not present

## 2021-09-11 DIAGNOSIS — Z9181 History of falling: Secondary | ICD-10-CM | POA: Diagnosis not present

## 2021-09-11 DIAGNOSIS — Z856 Personal history of leukemia: Secondary | ICD-10-CM | POA: Diagnosis not present

## 2021-09-11 DIAGNOSIS — K2960 Other gastritis without bleeding: Secondary | ICD-10-CM | POA: Diagnosis not present

## 2021-09-11 DIAGNOSIS — Z85828 Personal history of other malignant neoplasm of skin: Secondary | ICD-10-CM | POA: Diagnosis not present

## 2021-09-11 DIAGNOSIS — G250 Essential tremor: Secondary | ICD-10-CM | POA: Diagnosis not present

## 2021-09-17 DIAGNOSIS — E559 Vitamin D deficiency, unspecified: Secondary | ICD-10-CM | POA: Diagnosis not present

## 2021-09-17 DIAGNOSIS — K2960 Other gastritis without bleeding: Secondary | ICD-10-CM | POA: Diagnosis not present

## 2021-09-17 DIAGNOSIS — M8580 Other specified disorders of bone density and structure, unspecified site: Secondary | ICD-10-CM | POA: Diagnosis not present

## 2021-09-17 DIAGNOSIS — Z7982 Long term (current) use of aspirin: Secondary | ICD-10-CM | POA: Diagnosis not present

## 2021-09-17 DIAGNOSIS — D6869 Other thrombophilia: Secondary | ICD-10-CM | POA: Diagnosis not present

## 2021-09-17 DIAGNOSIS — I480 Paroxysmal atrial fibrillation: Secondary | ICD-10-CM | POA: Diagnosis not present

## 2021-09-17 DIAGNOSIS — M48061 Spinal stenosis, lumbar region without neurogenic claudication: Secondary | ICD-10-CM | POA: Diagnosis not present

## 2021-09-17 DIAGNOSIS — Z856 Personal history of leukemia: Secondary | ICD-10-CM | POA: Diagnosis not present

## 2021-09-17 DIAGNOSIS — M170 Bilateral primary osteoarthritis of knee: Secondary | ICD-10-CM | POA: Diagnosis not present

## 2021-09-17 DIAGNOSIS — G250 Essential tremor: Secondary | ICD-10-CM | POA: Diagnosis not present

## 2021-09-17 DIAGNOSIS — Z9181 History of falling: Secondary | ICD-10-CM | POA: Diagnosis not present

## 2021-09-17 DIAGNOSIS — Z85828 Personal history of other malignant neoplasm of skin: Secondary | ICD-10-CM | POA: Diagnosis not present

## 2021-09-24 DIAGNOSIS — G250 Essential tremor: Secondary | ICD-10-CM | POA: Diagnosis not present

## 2021-09-24 DIAGNOSIS — Z9181 History of falling: Secondary | ICD-10-CM | POA: Diagnosis not present

## 2021-09-24 DIAGNOSIS — M48061 Spinal stenosis, lumbar region without neurogenic claudication: Secondary | ICD-10-CM | POA: Diagnosis not present

## 2021-09-24 DIAGNOSIS — D6869 Other thrombophilia: Secondary | ICD-10-CM | POA: Diagnosis not present

## 2021-09-24 DIAGNOSIS — M170 Bilateral primary osteoarthritis of knee: Secondary | ICD-10-CM | POA: Diagnosis not present

## 2021-09-24 DIAGNOSIS — Z856 Personal history of leukemia: Secondary | ICD-10-CM | POA: Diagnosis not present

## 2021-09-24 DIAGNOSIS — Z7982 Long term (current) use of aspirin: Secondary | ICD-10-CM | POA: Diagnosis not present

## 2021-09-24 DIAGNOSIS — E559 Vitamin D deficiency, unspecified: Secondary | ICD-10-CM | POA: Diagnosis not present

## 2021-09-24 DIAGNOSIS — M8580 Other specified disorders of bone density and structure, unspecified site: Secondary | ICD-10-CM | POA: Diagnosis not present

## 2021-09-24 DIAGNOSIS — I480 Paroxysmal atrial fibrillation: Secondary | ICD-10-CM | POA: Diagnosis not present

## 2021-09-24 DIAGNOSIS — Z85828 Personal history of other malignant neoplasm of skin: Secondary | ICD-10-CM | POA: Diagnosis not present

## 2021-09-24 DIAGNOSIS — K2960 Other gastritis without bleeding: Secondary | ICD-10-CM | POA: Diagnosis not present

## 2021-10-23 DIAGNOSIS — J9601 Acute respiratory failure with hypoxia: Secondary | ICD-10-CM | POA: Diagnosis not present

## 2021-10-23 DIAGNOSIS — Z856 Personal history of leukemia: Secondary | ICD-10-CM | POA: Diagnosis not present

## 2021-10-23 DIAGNOSIS — J209 Acute bronchitis, unspecified: Secondary | ICD-10-CM | POA: Diagnosis not present

## 2021-10-23 DIAGNOSIS — Z8249 Family history of ischemic heart disease and other diseases of the circulatory system: Secondary | ICD-10-CM | POA: Diagnosis not present

## 2021-10-23 DIAGNOSIS — K219 Gastro-esophageal reflux disease without esophagitis: Secondary | ICD-10-CM | POA: Diagnosis not present

## 2021-10-23 DIAGNOSIS — I5021 Acute systolic (congestive) heart failure: Secondary | ICD-10-CM | POA: Diagnosis not present

## 2021-10-23 DIAGNOSIS — I2510 Atherosclerotic heart disease of native coronary artery without angina pectoris: Secondary | ICD-10-CM | POA: Diagnosis not present

## 2021-10-23 DIAGNOSIS — R590 Localized enlarged lymph nodes: Secondary | ICD-10-CM | POA: Diagnosis not present

## 2021-10-23 DIAGNOSIS — I083 Combined rheumatic disorders of mitral, aortic and tricuspid valves: Secondary | ICD-10-CM | POA: Diagnosis not present

## 2021-10-23 DIAGNOSIS — E785 Hyperlipidemia, unspecified: Secondary | ICD-10-CM | POA: Diagnosis not present

## 2021-10-23 DIAGNOSIS — I340 Nonrheumatic mitral (valve) insufficiency: Secondary | ICD-10-CM | POA: Diagnosis not present

## 2021-10-23 DIAGNOSIS — Z888 Allergy status to other drugs, medicaments and biological substances status: Secondary | ICD-10-CM | POA: Diagnosis not present

## 2021-10-23 DIAGNOSIS — Z20822 Contact with and (suspected) exposure to covid-19: Secondary | ICD-10-CM | POA: Diagnosis not present

## 2021-10-23 DIAGNOSIS — Z8544 Personal history of malignant neoplasm of other female genital organs: Secondary | ICD-10-CM | POA: Diagnosis not present

## 2021-10-23 DIAGNOSIS — Z7901 Long term (current) use of anticoagulants: Secondary | ICD-10-CM | POA: Diagnosis not present

## 2021-10-23 DIAGNOSIS — I480 Paroxysmal atrial fibrillation: Secondary | ICD-10-CM | POA: Diagnosis not present

## 2021-10-23 DIAGNOSIS — I959 Hypotension, unspecified: Secondary | ICD-10-CM | POA: Diagnosis not present

## 2021-10-23 DIAGNOSIS — I361 Nonrheumatic tricuspid (valve) insufficiency: Secondary | ICD-10-CM | POA: Diagnosis not present

## 2021-10-23 DIAGNOSIS — J811 Chronic pulmonary edema: Secondary | ICD-10-CM | POA: Diagnosis not present

## 2021-10-23 DIAGNOSIS — Z85828 Personal history of other malignant neoplasm of skin: Secondary | ICD-10-CM | POA: Diagnosis not present

## 2021-10-23 DIAGNOSIS — I509 Heart failure, unspecified: Secondary | ICD-10-CM | POA: Diagnosis not present

## 2021-10-23 DIAGNOSIS — I110 Hypertensive heart disease with heart failure: Secondary | ICD-10-CM | POA: Diagnosis not present

## 2021-10-23 DIAGNOSIS — Z79899 Other long term (current) drug therapy: Secondary | ICD-10-CM | POA: Diagnosis not present

## 2021-10-23 DIAGNOSIS — F419 Anxiety disorder, unspecified: Secondary | ICD-10-CM | POA: Diagnosis not present

## 2021-10-23 DIAGNOSIS — I4891 Unspecified atrial fibrillation: Secondary | ICD-10-CM | POA: Diagnosis not present

## 2021-10-23 DIAGNOSIS — I5041 Acute combined systolic (congestive) and diastolic (congestive) heart failure: Secondary | ICD-10-CM | POA: Diagnosis not present

## 2021-10-23 DIAGNOSIS — K2270 Barrett's esophagus without dysplasia: Secondary | ICD-10-CM | POA: Diagnosis not present

## 2021-10-23 DIAGNOSIS — K449 Diaphragmatic hernia without obstruction or gangrene: Secondary | ICD-10-CM | POA: Diagnosis not present

## 2021-10-23 DIAGNOSIS — R0602 Shortness of breath: Secondary | ICD-10-CM | POA: Diagnosis not present

## 2021-10-23 DIAGNOSIS — Z7982 Long term (current) use of aspirin: Secondary | ICD-10-CM | POA: Diagnosis not present

## 2021-10-23 DIAGNOSIS — F32A Depression, unspecified: Secondary | ICD-10-CM | POA: Diagnosis not present

## 2021-10-23 DIAGNOSIS — R911 Solitary pulmonary nodule: Secondary | ICD-10-CM | POA: Diagnosis not present

## 2021-10-23 DIAGNOSIS — Z66 Do not resuscitate: Secondary | ICD-10-CM | POA: Diagnosis not present

## 2021-10-23 DIAGNOSIS — J9811 Atelectasis: Secondary | ICD-10-CM | POA: Diagnosis not present

## 2021-10-23 DIAGNOSIS — I10 Essential (primary) hypertension: Secondary | ICD-10-CM | POA: Diagnosis not present

## 2021-10-23 DIAGNOSIS — I428 Other cardiomyopathies: Secondary | ICD-10-CM | POA: Diagnosis not present

## 2021-10-23 DIAGNOSIS — J90 Pleural effusion, not elsewhere classified: Secondary | ICD-10-CM | POA: Diagnosis not present

## 2021-10-23 DIAGNOSIS — M1990 Unspecified osteoarthritis, unspecified site: Secondary | ICD-10-CM | POA: Diagnosis not present

## 2021-10-24 ENCOUNTER — Inpatient Hospital Stay (HOSPITAL_COMMUNITY)
Admission: AD | Admit: 2021-10-24 | Discharge: 2021-10-26 | DRG: 286 | Disposition: A | Discharge status: Home / Self Care | Payer: Medicare Other | Attending: Cardiovascular Disease | Admitting: Cardiovascular Disease

## 2021-10-24 ENCOUNTER — Encounter (HOSPITAL_COMMUNITY): Payer: Self-pay | Admitting: Cardiovascular Disease

## 2021-10-24 ENCOUNTER — Other Ambulatory Visit: Payer: Self-pay

## 2021-10-24 ENCOUNTER — Other Ambulatory Visit (HOSPITAL_COMMUNITY): Payer: Self-pay

## 2021-10-24 ENCOUNTER — Encounter (HOSPITAL_COMMUNITY)
Admission: AD | Disposition: A | Discharge status: Home / Self Care | Payer: Self-pay | Source: Home / Self Care | Attending: Cardiovascular Disease

## 2021-10-24 DIAGNOSIS — J811 Chronic pulmonary edema: Secondary | ICD-10-CM | POA: Diagnosis not present

## 2021-10-24 DIAGNOSIS — J9811 Atelectasis: Secondary | ICD-10-CM | POA: Diagnosis not present

## 2021-10-24 DIAGNOSIS — Z8544 Personal history of malignant neoplasm of other female genital organs: Secondary | ICD-10-CM | POA: Diagnosis not present

## 2021-10-24 DIAGNOSIS — I4891 Unspecified atrial fibrillation: Secondary | ICD-10-CM | POA: Diagnosis not present

## 2021-10-24 DIAGNOSIS — Z85828 Personal history of other malignant neoplasm of skin: Secondary | ICD-10-CM

## 2021-10-24 DIAGNOSIS — Z20822 Contact with and (suspected) exposure to covid-19: Secondary | ICD-10-CM | POA: Diagnosis present

## 2021-10-24 DIAGNOSIS — I110 Hypertensive heart disease with heart failure: Secondary | ICD-10-CM | POA: Diagnosis not present

## 2021-10-24 DIAGNOSIS — I428 Other cardiomyopathies: Secondary | ICD-10-CM | POA: Diagnosis not present

## 2021-10-24 DIAGNOSIS — I2510 Atherosclerotic heart disease of native coronary artery without angina pectoris: Secondary | ICD-10-CM | POA: Diagnosis not present

## 2021-10-24 DIAGNOSIS — Z7982 Long term (current) use of aspirin: Secondary | ICD-10-CM | POA: Diagnosis not present

## 2021-10-24 DIAGNOSIS — R0902 Hypoxemia: Secondary | ICD-10-CM

## 2021-10-24 DIAGNOSIS — Z8249 Family history of ischemic heart disease and other diseases of the circulatory system: Secondary | ICD-10-CM | POA: Diagnosis not present

## 2021-10-24 DIAGNOSIS — Z66 Do not resuscitate: Secondary | ICD-10-CM | POA: Diagnosis not present

## 2021-10-24 DIAGNOSIS — K219 Gastro-esophageal reflux disease without esophagitis: Secondary | ICD-10-CM | POA: Diagnosis present

## 2021-10-24 DIAGNOSIS — Z7901 Long term (current) use of anticoagulants: Secondary | ICD-10-CM | POA: Diagnosis not present

## 2021-10-24 DIAGNOSIS — I083 Combined rheumatic disorders of mitral, aortic and tricuspid valves: Secondary | ICD-10-CM | POA: Diagnosis present

## 2021-10-24 DIAGNOSIS — Z79899 Other long term (current) drug therapy: Secondary | ICD-10-CM | POA: Diagnosis not present

## 2021-10-24 DIAGNOSIS — I959 Hypotension, unspecified: Secondary | ICD-10-CM | POA: Diagnosis not present

## 2021-10-24 DIAGNOSIS — F32A Depression, unspecified: Secondary | ICD-10-CM | POA: Diagnosis not present

## 2021-10-24 DIAGNOSIS — R590 Localized enlarged lymph nodes: Secondary | ICD-10-CM | POA: Diagnosis not present

## 2021-10-24 DIAGNOSIS — I5021 Acute systolic (congestive) heart failure: Secondary | ICD-10-CM | POA: Diagnosis present

## 2021-10-24 DIAGNOSIS — F419 Anxiety disorder, unspecified: Secondary | ICD-10-CM | POA: Diagnosis present

## 2021-10-24 DIAGNOSIS — Z856 Personal history of leukemia: Secondary | ICD-10-CM

## 2021-10-24 DIAGNOSIS — K2270 Barrett's esophagus without dysplasia: Secondary | ICD-10-CM | POA: Diagnosis not present

## 2021-10-24 DIAGNOSIS — I480 Paroxysmal atrial fibrillation: Secondary | ICD-10-CM | POA: Diagnosis present

## 2021-10-24 DIAGNOSIS — J90 Pleural effusion, not elsewhere classified: Secondary | ICD-10-CM | POA: Diagnosis not present

## 2021-10-24 HISTORY — PX: RIGHT/LEFT HEART CATH AND CORONARY ANGIOGRAPHY: CATH118266

## 2021-10-24 HISTORY — DX: Acute myeloblastic leukemia, not having achieved remission: C92.00

## 2021-10-24 LAB — POCT I-STAT EG7
Acid-Base Excess: 6 mmol/L — ABNORMAL HIGH (ref 0.0–2.0)
Acid-Base Excess: 7 mmol/L — ABNORMAL HIGH (ref 0.0–2.0)
Bicarbonate: 31.1 mmol/L — ABNORMAL HIGH (ref 20.0–28.0)
Bicarbonate: 31.9 mmol/L — ABNORMAL HIGH (ref 20.0–28.0)
Calcium, Ion: 1.12 mmol/L — ABNORMAL LOW (ref 1.15–1.40)
Calcium, Ion: 1.15 mmol/L (ref 1.15–1.40)
HCT: 41 % (ref 36.0–46.0)
HCT: 42 % (ref 36.0–46.0)
Hemoglobin: 13.9 g/dL (ref 12.0–15.0)
Hemoglobin: 14.3 g/dL (ref 12.0–15.0)
O2 Saturation: 60 %
O2 Saturation: 72 %
Potassium: 3.2 mmol/L — ABNORMAL LOW (ref 3.5–5.1)
Potassium: 3.4 mmol/L — ABNORMAL LOW (ref 3.5–5.1)
Sodium: 141 mmol/L (ref 135–145)
Sodium: 142 mmol/L (ref 135–145)
TCO2: 32 mmol/L (ref 22–32)
TCO2: 33 mmol/L — ABNORMAL HIGH (ref 22–32)
pCO2, Ven: 45.1 mmHg (ref 44.0–60.0)
pCO2, Ven: 46.8 mmHg (ref 44.0–60.0)
pH, Ven: 7.442 — ABNORMAL HIGH (ref 7.250–7.430)
pH, Ven: 7.446 — ABNORMAL HIGH (ref 7.250–7.430)
pO2, Ven: 30 mmHg — CL (ref 32.0–45.0)
pO2, Ven: 37 mmHg (ref 32.0–45.0)

## 2021-10-24 LAB — POCT I-STAT 7, (LYTES, BLD GAS, ICA,H+H)
Acid-Base Excess: 1 mmol/L (ref 0.0–2.0)
Bicarbonate: 27.1 mmol/L (ref 20.0–28.0)
Calcium, Ion: 1.11 mmol/L — ABNORMAL LOW (ref 1.15–1.40)
HCT: 40 % (ref 36.0–46.0)
Hemoglobin: 13.6 g/dL (ref 12.0–15.0)
O2 Saturation: 91 %
Potassium: 3.1 mmol/L — ABNORMAL LOW (ref 3.5–5.1)
Sodium: 127 mmol/L — ABNORMAL LOW (ref 135–145)
TCO2: 29 mmol/L (ref 22–32)
pCO2 arterial: 48 mmHg (ref 32.0–48.0)
pH, Arterial: 7.359 (ref 7.350–7.450)
pO2, Arterial: 64 mmHg — ABNORMAL LOW (ref 83.0–108.0)

## 2021-10-24 SURGERY — RIGHT/LEFT HEART CATH AND CORONARY ANGIOGRAPHY
Anesthesia: LOCAL

## 2021-10-24 MED ORDER — SODIUM CHLORIDE 0.9% FLUSH: 3.0000 mL | Freq: Two times a day (BID) | INTRAVENOUS | Status: DC

## 2021-10-24 MED ORDER — ACETAMINOPHEN 325 MG PO TABS: 650.0000 mg | ORAL_TABLET | ORAL | Status: DC | PRN

## 2021-10-24 MED ORDER — HEPARIN SODIUM (PORCINE) 1000 UNIT/ML IJ SOLN
INTRAMUSCULAR | Status: DC | PRN
  Administered 2021-10-24: 4000 [IU] via INTRAVENOUS

## 2021-10-24 MED ORDER — VERAPAMIL HCL 2.5 MG/ML IV SOLN
INTRAVENOUS | Status: DC | PRN
  Administered 2021-10-24: 10:00:00 10 mL via INTRA_ARTERIAL

## 2021-10-24 MED ORDER — PROSIGHT PO TABS
1.0000 | ORAL_TABLET | Freq: Every day | ORAL | Status: DC
  Administered 2021-10-25 – 2021-10-26 (×2): 1 via ORAL
  Filled 2021-10-24 (×2): qty 1

## 2021-10-24 MED ORDER — SODIUM CHLORIDE 0.9% FLUSH: 3.0000 mL | INTRAVENOUS | Status: DC | PRN

## 2021-10-24 MED ORDER — FENTANYL CITRATE (PF) 100 MCG/2ML IJ SOLN
INTRAMUSCULAR | Status: DC | PRN
  Administered 2021-10-24: 25 ug via INTRAVENOUS

## 2021-10-24 MED ORDER — SODIUM CHLORIDE 0.9% FLUSH
3.0000 mL | Freq: Two times a day (BID) | INTRAVENOUS | Status: DC
  Administered 2021-10-24 – 2021-10-26 (×4): 3 mL via INTRAVENOUS

## 2021-10-24 MED ORDER — HEPARIN (PORCINE) IN NACL 1000-0.9 UT/500ML-% IV SOLN
INTRAVENOUS | Status: DC | PRN
  Administered 2021-10-24 (×2): 500 mL

## 2021-10-24 MED ORDER — LABETALOL HCL 5 MG/ML IV SOLN: 10.0000 mg | INTRAVENOUS | Status: AC | PRN

## 2021-10-24 MED ORDER — AMIODARONE LOAD VIA INFUSION
150.0000 mg | Freq: Once | INTRAVENOUS | Status: AC
  Administered 2021-10-24: 11:00:00 150 mg via INTRAVENOUS
  Filled 2021-10-24: qty 83.34

## 2021-10-24 MED ORDER — OSTEO BI-FLEX ADV TRIPLE ST PO TABS: 1.0000 | ORAL_TABLET | Freq: Every day | ORAL | Status: DC

## 2021-10-24 MED ORDER — HEPARIN (PORCINE) IN NACL 1000-0.9 UT/500ML-% IV SOLN
INTRAVENOUS | Status: AC
  Filled 2021-10-24: qty 1000

## 2021-10-24 MED ORDER — PANTOPRAZOLE SODIUM 40 MG PO TBEC
40.0000 mg | DELAYED_RELEASE_TABLET | Freq: Every day | ORAL | Status: DC
  Administered 2021-10-24 – 2021-10-26 (×3): 40 mg via ORAL
  Filled 2021-10-24 (×3): qty 1

## 2021-10-24 MED ORDER — AMIODARONE HCL IN DEXTROSE 360-4.14 MG/200ML-% IV SOLN
60.0000 mg/h | INTRAVENOUS | Status: AC
  Administered 2021-10-24 (×2): 60 mg/h via INTRAVENOUS
  Filled 2021-10-24: qty 200

## 2021-10-24 MED ORDER — SODIUM CHLORIDE 0.9 % IV SOLN: INTRAVENOUS | Status: AC

## 2021-10-24 MED ORDER — POLYVINYL ALCOHOL 1.4 % OP SOLN
1.0000 [drp] | Freq: Every day | OPHTHALMIC | Status: DC | PRN
  Filled 2021-10-24: qty 15

## 2021-10-24 MED ORDER — LIDOCAINE HCL (PF) 1 % IJ SOLN
INTRAMUSCULAR | Status: AC
  Filled 2021-10-24: qty 30

## 2021-10-24 MED ORDER — MIDAZOLAM HCL 2 MG/2ML IJ SOLN
INTRAMUSCULAR | Status: AC
  Filled 2021-10-24: qty 2

## 2021-10-24 MED ORDER — LIDOCAINE HCL (PF) 1 % IJ SOLN
INTRAMUSCULAR | Status: DC | PRN
  Administered 2021-10-24 (×2): 2 mL

## 2021-10-24 MED ORDER — ONDANSETRON HCL 4 MG/2ML IJ SOLN: 4.0000 mg | Freq: Four times a day (QID) | INTRAMUSCULAR | Status: DC | PRN

## 2021-10-24 MED ORDER — ASPIRIN 81 MG PO CHEW
CHEWABLE_TABLET | ORAL | Status: AC
  Filled 2021-10-24: qty 1

## 2021-10-24 MED ORDER — AMIODARONE HCL IN DEXTROSE 360-4.14 MG/200ML-% IV SOLN
30.0000 mg/h | INTRAVENOUS | Status: DC
  Administered 2021-10-24 – 2021-10-25 (×2): 30 mg/h via INTRAVENOUS
  Filled 2021-10-24: qty 200

## 2021-10-24 MED ORDER — METOPROLOL TARTRATE 5 MG/5ML IV SOLN
INTRAVENOUS | Status: AC
  Filled 2021-10-24: qty 5

## 2021-10-24 MED ORDER — IOHEXOL 350 MG/ML SOLN
INTRAVENOUS | Status: DC | PRN
  Administered 2021-10-24: 10:00:00 20 mL via INTRA_ARTERIAL

## 2021-10-24 MED ORDER — METOPROLOL TARTRATE 5 MG/5ML IV SOLN
INTRAVENOUS | Status: DC | PRN
  Administered 2021-10-24: 5 mg via INTRAVENOUS

## 2021-10-24 MED ORDER — ESCITALOPRAM OXALATE 10 MG PO TABS
20.0000 mg | ORAL_TABLET | Freq: Every day | ORAL | Status: DC
  Administered 2021-10-24: 16:00:00 20 mg via ORAL
  Filled 2021-10-24: qty 2

## 2021-10-24 MED ORDER — VITAMIN D 25 MCG (1000 UNIT) PO TABS
5000.0000 [IU] | ORAL_TABLET | Freq: Every day | ORAL | Status: DC
  Administered 2021-10-25 – 2021-10-26 (×2): 5000 [IU] via ORAL
  Filled 2021-10-24 (×3): qty 5

## 2021-10-24 MED ORDER — HEPARIN (PORCINE) 25000 UT/250ML-% IV SOLN
1050.0000 [IU]/h | INTRAVENOUS | Status: DC
  Administered 2021-10-24: 16:00:00 1050 [IU]/h via INTRAVENOUS
  Filled 2021-10-24: qty 250

## 2021-10-24 MED ORDER — FENTANYL CITRATE (PF) 100 MCG/2ML IJ SOLN
INTRAMUSCULAR | Status: AC
  Filled 2021-10-24: qty 2

## 2021-10-24 MED ORDER — VENLAFAXINE HCL ER 75 MG PO CP24
75.0000 mg | ORAL_CAPSULE | Freq: Every day | ORAL | Status: DC
  Administered 2021-10-24 – 2021-10-25 (×2): 75 mg via ORAL
  Filled 2021-10-24 (×2): qty 1

## 2021-10-24 MED ORDER — VITAMIN B-12 1000 MCG PO TABS
1000.0000 ug | ORAL_TABLET | Freq: Every day | ORAL | Status: DC
  Administered 2021-10-25 – 2021-10-26 (×2): 1000 ug via ORAL
  Filled 2021-10-24 (×2): qty 1

## 2021-10-24 MED ORDER — SODIUM CHLORIDE 0.9 % IV SOLN: 250.0000 mL | INTRAVENOUS | Status: DC | PRN

## 2021-10-24 MED ORDER — POTASSIUM CHLORIDE CRYS ER 20 MEQ PO TBCR
EXTENDED_RELEASE_TABLET | ORAL | Status: AC
  Filled 2021-10-24: qty 2

## 2021-10-24 MED ORDER — AMIODARONE HCL IN DEXTROSE 360-4.14 MG/200ML-% IV SOLN
INTRAVENOUS | Status: AC
  Filled 2021-10-24: qty 200

## 2021-10-24 MED ORDER — HYDRALAZINE HCL 20 MG/ML IJ SOLN: 10.0000 mg | INTRAMUSCULAR | Status: AC | PRN

## 2021-10-24 MED ORDER — POTASSIUM CHLORIDE CRYS ER 20 MEQ PO TBCR
40.0000 meq | EXTENDED_RELEASE_TABLET | Freq: Once | ORAL | Status: AC
  Administered 2021-10-24: 09:00:00 40 meq via ORAL
  Filled 2021-10-24: qty 2

## 2021-10-24 MED ORDER — ASPIRIN 81 MG PO CHEW
81.0000 mg | CHEWABLE_TABLET | ORAL | Status: AC
  Administered 2021-10-24: 09:00:00 81 mg via ORAL

## 2021-10-24 MED ORDER — DIAZEPAM 5 MG PO TABS
10.0000 mg | ORAL_TABLET | Freq: Two times a day (BID) | ORAL | Status: DC
  Administered 2021-10-24 – 2021-10-26 (×5): 10 mg via ORAL
  Filled 2021-10-24 (×5): qty 2

## 2021-10-24 MED ORDER — AMIODARONE HCL 150 MG/3ML IV SOLN
INTRAVENOUS | Status: AC
  Filled 2021-10-24: qty 3

## 2021-10-24 MED ORDER — SODIUM CHLORIDE 0.9 % IV SOLN: INTRAVENOUS | Status: DC

## 2021-10-24 MED ORDER — MIDAZOLAM HCL 2 MG/2ML IJ SOLN
INTRAMUSCULAR | Status: DC | PRN
  Administered 2021-10-24: 1 mg via INTRAVENOUS

## 2021-10-24 SURGICAL SUPPLY — 12 items
CATH BALLN WEDGE 5F 110CM (CATHETERS) ×2 IMPLANT
CATH INFINITI 5 FR JL3.5 (CATHETERS) ×2 IMPLANT
CATH INFINITI JR4 5F (CATHETERS) ×2 IMPLANT
DEVICE RAD COMP TR BAND LRG (VASCULAR PRODUCTS) ×2 IMPLANT
GLIDESHEATH SLEND SS 6F .021 (SHEATH) ×2 IMPLANT
GUIDEWIRE INQWIRE 1.5J.035X260 (WIRE) IMPLANT
INQWIRE 1.5J .035X260CM (WIRE) ×3
KIT HEART LEFT (KITS) ×3 IMPLANT
PACK CARDIAC CATHETERIZATION (CUSTOM PROCEDURE TRAY) ×3 IMPLANT
SHEATH GLIDE SLENDER 4/5FR (SHEATH) ×2 IMPLANT
SYR MEDRAD MARK 7 150ML (SYRINGE) ×3 IMPLANT
TRANSDUCER W/STOPCOCK (MISCELLANEOUS) ×3 IMPLANT

## 2021-10-24 NOTE — H&P (Addendum)
Cardiology Admission History and Physical:   Patient ID: [name] MRN: [id number]; DOB: [birth year]   Admission date: 10/24/2021  PCP:  Street, [name], MD   [place] Providers Cardiologist:  New - will be [place] Click here to update MD or APP on Care Team, Refresh:1}    Chief Complaint:  shortness of breath  Patient Profile:   [name] is a 80 y.o. 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  History of Present Illness:   The patient carries a history of atrial fibrillation per chart but no further details are available.  The patient herself does not remember this.  She does recall being on Eliquis in the past but it was stopped about 3 years ago due to vaginal bleeding.  When asked if she had further work-up, she states she spoke to her regular doctor about it who indicated he was going to call the company.  I am not quite sure what she means.  She does not recall having any sort of GYN follow-up.  The bleeding resolved off Eliquis and she is adamant that she does not ever want to take this again.  Hypertension is listed in her medical history as well, but she denies this and is not on any antihypertensives at home.  She presented to [facility] yesterday around 2:00 in the morning with worsening of shortness of breath.  She had been feeling this way since the day before but it got worse when she was trying to sleep.  She did not have any chest pain.  In retrospect she states she had been feeling less endurance for about a month now.  Due to worsening symptoms, she came to the ED where she was found to be in rapid atrial fibrillation with initial heart rate 147 and BP 172/97.  BNP was 3670 and troponins negative.  CT angio showed no evidence of PE, but did show numerous pulmonary nodules and mediastinal/bilateral hilar lymphadenopathy (latter favored reactive per radiology note) - for nodules recommendation to consider noncontrast CT scan in 12 months, for lymphadenopathy says recommend attention on follow-up CT. She was subsequently admitted there for further management. Per records she was started on ASA, Lovenox, carvedilol, Entresto, IV Lasix, statin and ASA. EF 25-30% with severe global HK, mildly impared RV systolic function, moderate LAE, mildly enlarged RA, possible evidence of interatrial communication by color flow doppler, negative contrast study, mild AI, mild-moderate MR, mild TR, unable to estimate RVSP due to inadequate TR jet. She was subsequently transferred to [facility] for cath. Cath showed mild diffuse nonobstructive coronary plaquing (40% mLAD) and essentially normal intracardiac filling pressures with wedge pressure of 13 mmHg, PA pressure 25/9 with a mean of 16 mmHg, and preserved cardiac output of 7.4 L/min. Upon presentation here her BP tended to be on the soft side. Dr. Burt Knack recommended to initiate IV amiodarone and consider TEE/DCCV this admission. She was also started on IV heparin post-procedure with plan to transition to oral anticoagulation. Per discussion with the patient she is unwilling to reconsider Eliquis but is willing to consider trial of Xarelto. She is currently seen post-cath and resting comfortably without any CP or dyspnea. HR improved to 80s-90s. Denies fever, chills, weight changes, recent s/sx of other bleeding. Up until a  month ago had been feeling well.  Labs @ OSH: 10/23/21 WBC 6.2, Hgb 12.9, Plt 226, INR 1.0, K 3.7, BUN 15, Cr 0.70, AST/ALT wnl, troponins negative x3, albumin 3.0, Mg 2.0, alk phos wnl, LDL 115, HLD 53, trig 136, pBNP 3670 10/24/21 WBC 6.9, Hgb 13.4, plt 229, K 3.4, Cr 0.70, calcium 8.7, AST/ALT wnl, albumin 3.5, f/u/Covid/RSV negative  2D Echo 10/23/21 EF  25-30% with severe global HK, mildly impared RV systolic function, moderate LAE, mildly enlarged RA, possible evidence of interatrial communication by color flow doppler, negative contrast study, mild AI, mild-moderate MR, mild TR, unable to estimate RVSP due to inadequate TR jet  CXR 10/23/21 - small bilateral pleural effusions with bibasilar atelectasis and mild interstitial pulmonary edema  CT Angio Chest 10/23/21 1. No evidence of pulmonary embolism.  2. Diffusely thickened interstitium throughout both lungs, consistent with pulmonary edema.  3. Mild, bilateral upper lobe and bilateral lower lobe atelectasis and/or infiltrate  4. Bilateral, moderate sized pleural effusions.  5. Numerous, predominant peripheral subcentimeter bilateral upper lobe noncalcified lung nodules. No follow-up needed if patient is low-risk (and has no known or suspected primary neoplasm).  Non-contrast chest CT can be considered in 12 months if patient is high-risk. This recommendation follows the consensus statement:  [place] for Management of Incidental Pulmonary Nodules Detected on CT Images: From the Fleischner Society 2017; Radiology 2017;  284:228-243.  6. Mediastinal and bilateral hilar lymphadenopathy which may be, in part, reactive in nature. Correlation with follow-up chest CT is recommended to determine stability and exclude the presence of an underlying neoplastic process..  7. Small hiatal hernia.  8. Mild coronary artery calcification.  9. Aortic atherosclerosIs.    Past Medical History:  Diagnosis Date   AML (acute myelogenous leukemia) ([place])    Anxiety    Atrial fibrillation (HCC)    Barrett's esophagus    Basal cell carcinoma of vulva (HCC)    Depression    Endometrial polyp    "polyps" per intake sheet   GERD (gastroesophageal reflux disease)    Hemorrhoids    Hypertension    Spinal stenosis    Weakness     Past Surgical History:  Procedure Laterality Date   CATARACT EXTRACTION,  BILATERAL     august/september 2017   cataract surgery     CERVICAL POLYPECTOMY N/A 08/14/2015   Procedure: CERVICAL POLYPECTOMY;  Surgeon: Newton Pigg, MD;  Location: [facility];  Service: Gynecology;  Laterality: N/A;   CHOLECYSTECTOMY     endometrial polyp surgery     HEMORROIDECTOMY     HYSTEROSCOPY WITH D & C N/A 08/14/2015   Procedure: DILATATION AND CURETTAGE /HYSTEROSCOPY;  Surgeon: Newton Pigg, MD;  Location: [facility];  Service: Gynecology;  Laterality: N/A;   KNEE SURGERY Right    PORTACATH PLACEMENT     TUNNELED VENOUS CATHETER PLACEMENT       Medications Prior to Admission: Pending She knows she takes Valium and Acid reflux medicine but cannot recall names of others  Allergies:    Allergies  Allergen Reactions   Compazine [Prochlorperazine Edisylate] Hives   Cortisone    Eliquis [Apixaban] Itching and Nausea Only    Pt does not recall itching/nausea as entered in chart in 2018 but states she had vaginal bleeding with this   Prednisone Nausea Only    Gives her a really bad headache   Prochlorperazine Other (See Comments)    unknown    Social History:   Social History   Socioeconomic History  Marital status: Married    Spouse name: Not on file   Number of children: 1   Years of education: Not on file   Highest education level: Not on file  Occupational History   Not on file  Tobacco Use   Smoking status: Never   Smokeless tobacco: Never  Vaping Use   Vaping Use: Never used  Substance and Sexual Activity   Alcohol use: No   Drug use: No   Sexual activity: Not on file  Other Topics Concern   Not on file  Social History Narrative   Lives at home with husband   Right handed   Caffeine: quit years ago   Social Determinants of Radio broadcast assistant Strain: Not on file  Food Insecurity: Not on file  Transportation Needs: Not on file  Physical Activity: Not on file  Stress: Not on file  Social Connections: Not on file  Intimate Partner  Violence: Not on file    Family History:   The patient's family history includes Alzheimer's disease in her brother; Breast cancer in her paternal aunt; CVA in her mother; Diabetes in her mother; Heart attack in her brother and mother; Heart disease in her brother and mother; Lymphoma in her father; Non-Hodgkin's lymphoma in her father. There is no history of Colon cancer.    ROS:  Please see the history of present illness.  All other ROS reviewed and negative.     Physical Exam/Data:   Vitals:   10/24/21 1030 10/24/21 1044 10/24/21 1046 10/24/21 1108  BP: (!) 101/55  99/60   Pulse: (!) 134   95  Resp: 10 13  19   SpO2: (!) 0%   92%  Weight:      Height:       No intake or output data in the 24 hours ending 10/24/21 1211 Last 3 Weights 10/24/2021 09/30/2019 07/21/2019  Weight (lbs) 176 lb 5.9 oz 178 lb 170 lb  Weight (kg) 80 kg 80.74 kg 77.111 kg     Body mass index is 26.82 kg/m.  General: Well developed, well nourished WF, in no acute distress. Head: Normocephalic, atraumatic, sclera non-icteric, no xanthomas, nares are without discharge. Neck: Negative for carotid bruits. JVP not elevated. Lungs: Clear bilaterally to auscultation without wheezes, rales, or rhonchi. Breathing is unlabored. Heart: Irregularly irregular, rate controlled 80s, S1 S2 without murmurs, rubs, or gallops.  Abdomen: Soft, non-tender, non-distended with normoactive bowel sounds. No rebound/guarding. Extremities: No clubbing or cyanosis. No edema. Distal pedal pulses are 2+ and equal bilaterally. Neuro: Alert and oriented X 3. Moves all extremities spontaneously. Psych:  Responds to questions appropriately with a normal affect.  EKG:  The ECG that was done 10/23/21 was personally reviewed and demonstrates atrial fib 110bpm, no acute STT changes, possible prior inferior infarct based on Q wave III  Relevant CV Studies: As outlined above  Laboratory Data: As outlined above  Radiology/Studies:  As  outlined above  CARDIAC CATHETERIZATION  Result Date: 10/24/2021   Mid LAD lesion is 40% stenosed. 1.  Mild diffuse nonobstructive coronary plaquing. 2.  Essentially normal intracardiac filling pressures with wedge pressure of 13 mmHg, PA pressure 25/9 with a mean of 16 mmHg, and preserved cardiac output of 7.4 L/min Recommend: Patient appears to have nonischemic cardiomyopathy, likely tachycardia-mediated. Patient remains in atrial fibrillation with RVR.  I do not think her blood pressure will tolerate aggressive beta-blocker titration.  Calcium channel blockers are contraindicated in the setting of her severe cardiomyopathy.  Would start IV amiodarone and consider TEE/cardioversion per primary team.  We will start on IV heparin post procedure with plans to transition to oral anticoagulation per primary team.     Assessment and Plan:   1. Atrial fibrillation with RVR - unclear duration - pt had been feeling less than best x 1 month and states she was told it was likely arthritis - rare palpitations but no true persistent cardiac awareness; also has question of history of this - continue heparin per pharmacy - continue amiodarone as ordered post-cath - tentatively set up for TEE-DCCV Friday but will need to ensure she tolerates anticoagulation given hx of vaginal bleeding -> patient is agreeable to switching to Xarelto but declines to use Eliquis. We can revisit plans and commitment to anticoagulation in AM depending on how she does - CHADSVASC is 6 if we include HTN although pt declines hx of this - was hypertensive on arrival. Regardless score is >2 so high risk for stroke if not anticoagulated - check thyroid with AM labs  2. Acute systolic CHF/NICM - initially started on IV Lasix, Entresto, carvedilol but challenging given hospital-hospital transfer to discern how many dose she got of these - with soft BP will hold off advancing GDMT and follow BP, adding as tolerated - hold off further IV  diuresis given normal intracardiac filling pressures - presumed tachy-mediated due to atrial fib but cannot exclude other processes like myocarditis - will need reassessment of LVEF as outpatient once medically optimized  3. Mild CAD by cath - hold off ASA given need for [facility] - add statin for LDL goal <70 (LDL 115) - If the patient is tolerating statin at time of follow-up appointment, would consider rechecking liver function/lipid panel in 6-8 weeks.  4. Abnormal chest CT - pulmonary nodules, lymphadenopathy - will need OP f/u with PCP (for nodules recommendation to consider noncontrast CT scan in 12 months, for lymphadenopathy says recommend attention on follow-up CT) - did not discuss with patient in short stay as this was uncovered upon comprehensive review of records; please review with pt tomorrow  5. Anxiety/depression - Dr. Burt Knack has resumed home Valium rx, await formal home med rec otherwise for comprehensive review   6. History of vaginal bleeding 3 years ago - typically concerning in a post-menopausal woman - no recent recurrence - regardless of anticoagulation decision would recommend continued f/u with primary care  7. Valvular heart disease - 2D echo with mild AI, mild-moderate MR, mild TR -> follow clinically  Code status: reviewed in detail with patient; she states she has a living will and has requested to be DNR/DNI.   Formal home med rec pending.  Risk Assessment/Risk Scores:       New York Heart Association (NYHA) Functional Class NYHA Class III-IV on arrival   CHA2DS2-VASc Score = 6   This indicates a 9.7% annual risk of stroke. The patient's score is based upon: CHF History: 1 HTN History: 1 (Questionable history) Diabetes History: 0 Stroke History: 0 Vascular Disease History: 1 (Mild CAD by cath) Age Score: 2 Gender Score: 1      Severity of Illness: The appropriate patient status for this patient is INPATIENT. Inpatient status is judged to be  reasonable and necessary in order to provide the required intensity of service to ensure the patient's safety. The patient's presenting symptoms, physical exam findings, and initial radiographic and laboratory data in the context of their chronic comorbidities is felt to place them at high risk for  further clinical deterioration. Furthermore, it is not anticipated that the patient will be medically stable for discharge from the hospital within 2 midnights of admission.   * I certify that at the point of admission it is my clinical judgment that the patient will require inpatient hospital care spanning beyond 2 midnights from the point of admission due to high intensity of service, high risk for further deterioration and high frequency of surveillance required.*   For questions or updates, please contact [place] Please consult [email or web address] for contact info under     Signed, Charlie Pitter, PA-C  10/24/2021 12:11 PM   Patient seen, examined. Available data reviewed. Agree with findings, assessment, and plan as outlined by Melina Copa, PA-C.  The patient is independently interviewed and examined.  She is alert, oriented, elderly woman in no distress.  JVP is normal, lungs are clear bilaterally, heart is irregularly irregular with no murmur gallop, abdomen is soft nontender, extremities have no edema, skin is warm and dry with no rash, neurologic is grossly intact with 5/5 strength in the arms and legs bilaterally.  The patient presents for right and left heart catheterization to evaluate new onset congestive heart failure with LVEF less than 30% in the setting of atrial fibrillation with RVR (Paroxysmal Atrial Fibrillation).  I agree with proceeding with cardiac catheterization followed by oral anticoagulation, use of amiodarone for rhythm and rate control strategy, and likely TEE guided cardioversion in the next 24 to 48 hours.  The patient's clinical presentation is highly suspicious of  tachycardia-mediated cardiomyopathy.  Otherwise as outlined above.  Sherren Mocha, M.D. 10/24/2021 2:51 PM

## 2021-10-24 NOTE — TOC Benefit Eligibility Note (Signed)
Patient Teacher, English as a foreign language completed.    The patient is currently admitted and upon discharge could be taking Jardiance 10 mg.  The current 30 day co-pay is, $118.04 due to a $71.04 deductible remaining.   The patient is currently admitted and upon discharge could be taking Farxiga 10 mg.  The current 30 day co-pay is, $118.04 due to a $71.04 deductible remaining.   The patient is currently admitted and upon discharge could be taking Entresto 24-26 mg.  The current 30 day co-pay is, $118.04 due to a $71.04 deductible remaining.   The patient is currently admitted and upon discharge could be taking Eliquis 5 mg.  The current 30 day co-pay is, $118.04 due to a $71.04 deductible remaining.   The patient is currently admitted and upon discharge could be taking Xarelto 20 mg.  The current 30 day co-pay is, $118.04 due to a $71.04 deductible remaining.   The patient is insured through [place], [place] Patient Advocate Specialist [place] Patient Advocate Team Direct Number: [phone]  Fax: [phone]

## 2021-10-24 NOTE — Progress Notes (Signed)
Mobility Specialist: Progress Note   10/24/21 1651  Mobility  Activity Ambulated in hall  Level of Assistance Minimal assist, patient does 75% or more  Assistive Device  (IV Pole)  Distance Ambulated (ft) 350 ft  Mobility Ambulated with assistance in hallway  Mobility Response Tolerated well  Mobility performed by Mobility specialist  $Mobility charge 1 Mobility   Post-Mobility: 95 HR  Pt required minA to sit EOB and was contact guard during ambulation. Pt had no c/o throughout. Pt back to bed after walk with call bell and phone at her side.   [name] Mobility Specialist Mobility Specialist 4 [place]: [phone] Mobility Specialist 2 [place] and [place]: [phone]

## 2021-10-24 NOTE — H&P (Signed)
Notes are reviewed from [facility].  I reviewed emergency department notes, admission H&P, and cardiac consultation note written by Dr. Mayer Masker.  The patient transferred here for diagnostic right and left heart catheterization in the setting of new atrial fibrillation and severe cardiomyopathy with LVEF 25%.  Plan is to perform cardiac catheterization to rule out obstructive CAD as an etiology of her cardiomyopathy.  The patient is evaluated in the short stay area and she is not having any active chest pain or shortness of breath at rest.  Plans for right and left heart catheterization, possible PCI.  Full informed consent obtained.  [name] 10/24/2021 9:57 AM

## 2021-10-24 NOTE — Progress Notes (Signed)
TR Band has been removed and Tegaderm dressing applied to site. Radial pulse present.

## 2021-10-24 NOTE — Progress Notes (Signed)
ANTICOAGULATION CONSULT NOTE - Initial Consult  Pharmacy Consult for heparin Indication: atrial fibrillation  Allergies  Allergen Reactions   Compazine [Prochlorperazine Edisylate] Hives   Cortisone    Eliquis [Apixaban] Itching and Nausea Only    Pt does not recall itching/nausea as entered in chart in 2018 but states she had vaginal bleeding with this   Prednisone Nausea Only    Gives her a really bad headache   Prochlorperazine Other (See Comments)    unknown    Patient Measurements: Height: 5\' 7"  (170.2 cm) Weight: 74.1 kg (163 lb 5.8 oz) IBW/kg (Calculated) : 61.6  Vital Signs: Temp: 98.7 F (37.1 C) (12/28 1329) Temp Source: Oral (12/28 1329) BP: 98/78 (12/28 1329) Pulse Rate: 89 (12/28 1329)  Labs: No results for input(s): HGB, HCT, PLT, APTT, LABPROT, INR, HEPARINUNFRC, HEPRLOWMOCWT, CREATININE, CKTOTAL, CKMB, TROPONINIHS in the last 72 hours.  CrCl cannot be calculated (Patient's most recent lab result is older than the maximum 21 days allowed.).   Medical History: Past Medical History:  Diagnosis Date   AML (acute myelogenous leukemia) ([place])    Anxiety    Atrial fibrillation (HCC)    Barrett's esophagus    Basal cell carcinoma of vulva (HCC)    Depression    Endometrial polyp    "polyps" per intake sheet   GERD (gastroesophageal reflux disease)    Hemorrhoids    Hypertension    Spinal stenosis    Weakness     Medications:  Facility-Administered Medications Prior to Admission  Medication Dose Route Frequency Provider Last Rate Last Admin   0.9 %  sodium chloride infusion  500 mL Intravenous Once Irene Shipper, MD       Medications Prior to Admission  Medication Sig Dispense Refill Last Dose   aspirin EC 81 MG tablet Take 81 mg by mouth daily.      Cholecalciferol (VITAMIN D3 PO) Take 125 mcg by mouth daily.      diazepam (VALIUM) 10 MG tablet Take 10 mg by mouth 2 (two) times daily.      OVER THE COUNTER MEDICATION 500 mg daily. Ashwaghanda  extract      OVER THE COUNTER MEDICATION 2 each daily. Osteo-biflex      pantoprazole (PROTONIX) 40 MG tablet Take 1 tablet (40 mg total) by mouth daily. 90 tablet 3    TURMERIC CURCUMIN PO Take 1 each by mouth daily.      UNABLE TO FIND Med Name: Preservision and Mineral Supp 1 daily      vitamin B-12 (CYANOCOBALAMIN) 1000 MCG tablet Take 1,000 mcg by mouth daily.       Assessment: 80 yo female with SOB s/p cath with mild CAD. She is noted with afib to start heparin (2 hours post TR band removal)   Goal of Therapy:  Heparin level 0.3-0.7 units/ml Monitor platelets by anticoagulation protocol: Yes   Plan:  -start heparin at 1050 units/hr at 3pm -Heparin level in 8 hours and daily wth CBC daily  Hildred Laser, PharmD Clinical Pharmacist **Pharmacist phone directory can now be found on [email or web address] ([email or web address]).  Listed under [place].

## 2021-10-25 ENCOUNTER — Telehealth: Payer: Self-pay | Admitting: Pulmonary Disease

## 2021-10-25 ENCOUNTER — Inpatient Hospital Stay (HOSPITAL_COMMUNITY): Payer: Medicare Other

## 2021-10-25 DIAGNOSIS — I5021 Acute systolic (congestive) heart failure: Secondary | ICD-10-CM

## 2021-10-25 DIAGNOSIS — I4891 Unspecified atrial fibrillation: Secondary | ICD-10-CM

## 2021-10-25 DIAGNOSIS — J90 Pleural effusion, not elsewhere classified: Secondary | ICD-10-CM

## 2021-10-25 LAB — BASIC METABOLIC PANEL
Anion gap: 10 (ref 5–15)
Anion gap: 9 (ref 5–15)
BUN: 17 mg/dL (ref 8–23)
BUN: 17 mg/dL (ref 8–23)
CO2: 26 mmol/L (ref 22–32)
CO2: 28 mmol/L (ref 22–32)
Calcium: 9 mg/dL (ref 8.9–10.3)
Calcium: 9.2 mg/dL (ref 8.9–10.3)
Chloride: 102 mmol/L (ref 98–111)
Chloride: 100 mmol/L (ref 98–111)
Creatinine, Ser: 0.87 mg/dL (ref 0.44–1.00)
Creatinine, Ser: 0.96 mg/dL (ref 0.44–1.00)
GFR, Estimated: >60 mL/min (ref >60)
GFR, Estimated: 59 mL/min — ABNORMAL LOW (ref >60)
Glucose, Bld: 107 mg/dL — ABNORMAL HIGH (ref 70–99)
Glucose, Bld: 111 mg/dL — ABNORMAL HIGH (ref 70–99)
Potassium: 3.5 mmol/L (ref 3.5–5.1)
Potassium: 3.9 mmol/L (ref 3.5–5.1)
Sodium: 138 mmol/L (ref 135–145)
Sodium: 137 mmol/L (ref 135–145)

## 2021-10-25 LAB — CBC
HCT: 40.6 % (ref 36.0–46.0)
Hemoglobin: 13.3 g/dL (ref 12.0–15.0)
MCH: 29.5 pg (ref 26.0–34.0)
MCHC: 32.8 g/dL (ref 30.0–36.0)
MCV: 90 fL (ref 80.0–100.0)
Platelets: 233 10*3/uL (ref 150–400)
RBC: 4.51 MIL/uL (ref 3.87–5.11)
RDW: 14.1 % (ref 11.5–15.5)
WBC: 7.5 10*3/uL (ref 4.0–10.5)
nRBC: 0 % (ref 0.0–0.2)

## 2021-10-25 LAB — HEPARIN LEVEL (UNFRACTIONATED): Heparin Unfractionated: 0.57 IU/mL (ref 0.30–0.70)

## 2021-10-25 LAB — MAGNESIUM: Magnesium: 2 mg/dL (ref 1.7–2.4)

## 2021-10-25 LAB — TSH: TSH: 0.927 u[IU]/mL (ref 0.350–4.500)

## 2021-10-25 LAB — BRAIN NATRIURETIC PEPTIDE: B Natriuretic Peptide: 181.3 pg/mL — ABNORMAL HIGH (ref 0.0–100.0)

## 2021-10-25 MED ORDER — FAMOTIDINE 20 MG PO TABS
20.0000 mg | ORAL_TABLET | Freq: Every day | ORAL | Status: DC | PRN
  Administered 2021-10-25 – 2021-10-26 (×2): 20 mg via ORAL
  Filled 2021-10-25 (×2): qty 1

## 2021-10-25 MED ORDER — RIVAROXABAN 20 MG PO TABS: 20.0000 mg | ORAL_TABLET | Freq: Once | ORAL | Status: DC

## 2021-10-25 MED ORDER — RIVAROXABAN 20 MG PO TABS
20.0000 mg | ORAL_TABLET | Freq: Every day | ORAL | Status: DC
  Administered 2021-10-25: 18:00:00 20 mg via ORAL

## 2021-10-25 MED ORDER — HEPARIN (PORCINE) 25000 UT/250ML-% IV SOLN
1000.0000 [IU]/h | INTRAVENOUS | Status: AC
  Administered 2021-10-25: 13:00:00 1000 [IU]/h via INTRAVENOUS

## 2021-10-25 MED ORDER — RIVAROXABAN 20 MG PO TABS
20.0000 mg | ORAL_TABLET | Freq: Once | ORAL | Status: DC
  Filled 2021-10-25: qty 1

## 2021-10-25 MED ORDER — ATORVASTATIN CALCIUM 10 MG PO TABS
20.0000 mg | ORAL_TABLET | Freq: Every day | ORAL | Status: DC
  Administered 2021-10-25 – 2021-10-26 (×2): 20 mg via ORAL
  Filled 2021-10-25 (×2): qty 2

## 2021-10-25 MED ORDER — RIVAROXABAN 20 MG PO TABS
20.0000 mg | ORAL_TABLET | Freq: Every day | ORAL | Status: DC
Start: 2021-10-26 — End: 2021-10-25

## 2021-10-25 MED ORDER — POTASSIUM CHLORIDE CRYS ER 20 MEQ PO TBCR
40.0000 meq | EXTENDED_RELEASE_TABLET | Freq: Once | ORAL | Status: AC
  Administered 2021-10-25: 10:00:00 40 meq via ORAL
  Filled 2021-10-25: qty 2

## 2021-10-25 MED ORDER — RIVAROXABAN 20 MG PO TABS: 20.0000 mg | ORAL_TABLET | Freq: Every day | ORAL | Status: DC

## 2021-10-25 NOTE — Progress Notes (Signed)
CXR reviewed: Persistent pulmonary edema with right-greater-than-left pleural effusions. BNP only 181. Filling pressures were essentially normal yesterday by cath. BP too soft to diurese at this time. With CT findings of pulmonary nodules and lymphadenopathy, unclear what these effusions represent. We have consulted pulmonary team for further advisement, ? Thoracentesis for diagnostic/therapeutic purposes. I called into [name]'s room and gave her an update on the plan of care and reviewed the CT findings from [facility] yesterday. She is agreeable.  Also cancelled 1st dose of Xarelto (not yet given) given need for possible thoracentesis. Spoke with Mitzi Hansen with pharmacy to update him to plan to continue heparin until all procedures complete. Nurse also updated with plan of care.

## 2021-10-25 NOTE — Progress Notes (Signed)
Patient is on amiodarone for Afib. Patient converted to Sinus rhythm at 0127. Confirmed by EKG.

## 2021-10-25 NOTE — Progress Notes (Addendum)
Progress Note  Patient Name: [name] Date of Encounter: 10/25/2021  Primary Cardiologist: [facility], will need f/u in [place]  Subjective   Feeling much better this AM, no specific complaints. No CP, SOB, cough, edema, orthopnea. She is noted to have O2 sats 96-97 sitting but drops to 90-94 lying. She is asymptomatic with this.  Inpatient Medications    Scheduled Meds:  cholecalciferol  5,000 Units Oral Daily   diazepam  10 mg Oral BID   escitalopram  20 mg Oral Daily   multivitamin  1 tablet Oral Daily   pantoprazole  40 mg Oral Daily   sodium chloride flush  3 mL Intravenous Q12H   venlafaxine XR  75 mg Oral Q breakfast   vitamin B-12  1,000 mcg Oral Daily   Continuous Infusions:  sodium chloride     amiodarone 30 mg/hr (10/25/21 0407)   heparin 1,050 Units/hr (10/24/21 1625)   PRN Meds: sodium chloride, acetaminophen, ondansetron (ZOFRAN) IV, polyvinyl alcohol, sodium chloride flush   Vital Signs    Vitals:   10/24/21 1335 10/24/21 2006 10/25/21 0005 10/25/21 0607  BP:  117/90 105/81 (!) 103/57  Pulse:  84 99 72  Resp:  18 18 18   Temp:  97.9 F (36.6 C) 97.6 F (36.4 C) 97.8 F (36.6 C)  TempSrc:  Oral Oral Axillary  SpO2: 96% 94% 90% 93%  Weight:    75 kg  Height:        Intake/Output Summary (Last 24 hours) at 10/25/2021 0848 Last data filed at 10/25/2021 0300 Gross per 24 hour  Intake 873.52 ml  Output --  Net 873.52 ml   Last 3 Weights 10/25/2021 10/24/2021 10/24/2021  Weight (lbs) 165 lb 5.5 oz 163 lb 5.8 oz 176 lb 5.9 oz  Weight (kg) 75 kg 74.1 kg 80 kg     Telemetry    Converted to NSR overnight, occasional PACs - Personally Reviewed  ECG    NSR 69bpm, prior inferior and anteroseptal infarct, prolonged QTc 574ms - Personally Reviewed  Physical Exam   GEN: No acute distress.  HEENT: Normocephalic, atraumatic, sclera non-icteric. Neck: No JVD or bruits. Cardiac: RRR no murmurs, rubs, or gallops.  Respiratory: Coarse  BS throughout but no wheezing, rales or rhonchi. Breathing is unlabored. GI: Soft, nontender, non-distended, BS +x 4. MS: no deformity. Extremities: No clubbing or cyanosis. No edema. Distal pedal pulses are 2+ and equal bilaterally. Neuro:  AAOx3. Follows commands. Psych:  Responds to questions appropriately with a normal affect.  Labs    High Sensitivity Troponin:  No results for input(s): TROPONINIHS in the last 720 hours.    Cardiac EnzymesNo results for input(s): TROPONINI in the last 168 hours. No results for input(s): TROPIPOC in the last 168 hours.   Chemistry Recent Labs  Lab 10/24/21 1018 10/24/21 1025 10/25/21 0118  NA 142 127* 138  K 3.2* 3.1* 3.5  CL  --   --  102  CO2  --   --  26  GLUCOSE  --   --  107*  BUN  --   --  17  CREATININE  --   --  0.87  CALCIUM  --   --  9.0  GFRNONAA  --   --  >60  ANIONGAP  --   --  10     Hematology Recent Labs  Lab 10/24/21 1018 10/24/21 1025 10/25/21 0118  WBC  --   --  7.5  RBC  --   --  4.51  HGB 13.9 13.6 13.3  HCT 41.0 40.0 40.6  MCV  --   --  90.0  MCH  --   --  29.5  MCHC  --   --  32.8  RDW  --   --  14.1  PLT  --   --  233    BNPNo results for input(s): BNP, PROBNP in the last 168 hours.   DDimer No results for input(s): DDIMER in the last 168 hours.   Radiology    CARDIAC CATHETERIZATION  Result Date: 10/24/2021   Mid LAD lesion is 40% stenosed. 1.  Mild diffuse nonobstructive coronary plaquing. 2.  Essentially normal intracardiac filling pressures with wedge pressure of 13 mmHg, PA pressure 25/9 with a mean of 16 mmHg, and preserved cardiac output of 7.4 L/min Recommend: Patient appears to have nonischemic cardiomyopathy, likely tachycardia-mediated. Patient remains in atrial fibrillation with RVR.  I do not think her blood pressure will tolerate aggressive beta-blocker titration.  Calcium channel blockers are contraindicated in the setting of her severe cardiomyopathy.  Would start IV amiodarone and  consider TEE/cardioversion per primary team.  We will start on IV heparin post procedure with plans to transition to oral anticoagulation per primary team.    Cardiac Studies   LHC 10/24/21 Mid LAD lesion is 40% stenosed.   1.  Mild diffuse nonobstructive coronary plaquing. 2.  Essentially normal intracardiac filling pressures with wedge pressure of 13 mmHg, PA pressure 25/9 with a mean of 16 mmHg, and preserved cardiac output of 7.4 L/min   Recommend: Patient appears to have nonischemic cardiomyopathy, likely tachycardia-mediated. Patient remains in atrial fibrillation with RVR.  I do not think her blood pressure will tolerate aggressive beta-blocker titration.  Calcium channel blockers are contraindicated in the setting of her severe cardiomyopathy.  Would start IV amiodarone and consider TEE/cardioversion per primary team.  We will start on IV heparin post procedure with plans to transition to oral anticoagulation per primary team.   [facility]   2D Echo 10/23/21 EF 25-30% with severe global HK, mildly impared RV systolic function, moderate LAE, mildly enlarged RA, possible evidence of interatrial communication by color flow doppler, negative contrast study, mild AI, mild-moderate MR, mild TR, unable to estimate RVSP due to inadequate TR jet   Patient Profile     80 y.o. 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 Atrial fibrillation with RVR - unclear duration - pt had been feeling less than best x 1 month and states she was told it was likely arthritis - rare palpitations but no true persistent cardiac awareness; also has question of history of this - pt converted to NSR overnight on IV amiodarone so no need  for TEE/DCCV - re: anticoagulation, has done well on IV heparin overnight without any bleeding. Pt declines to use Eliquis due to hx of vaginal bleeding (not previously worked up) and denies any specific true allergy - we discussed risks/benefits/indications of Xarelto and she is agreeable to trial - will discuss switching over with MD today - QTC has become prolonged, will d/c amiodarone drip pending further review with MD - TSH wnl   2. Acute systolic CHF/NICM with intermittent borderline low O2 sats - initially started on IV  Lasix, Entresto, carvedilol at OSH but these were stopped on arrival due to hypotension - Covid, RSV, flu negative on admission - CT angio 10/23/21 without evidence for PE, + diffusely thickened interstitium through both lungs consistent with pulmonary edema, mild bilateral upper/lower lobe atelectasis and/or infiltrate, bilateral moderate pleural effusions, numerous peripheral subcentimeter bilateral upper lobe noncalcified lung nodules (consider non-con CT 12 months), mediastinal and bilateral hilar lymphadenopathy favored reactive per radiologist but correlation with follow-up chest CT recommended - with soft BP will hold off advancing GDMT - presumed tachy-mediated due to atrial fib but cannot exclude other processes like myocarditis - will need reassessment of LVEF as outpatient once medically optimized Re: Oxygen: - noted to have drop in O2 sats with recumbency this morning, although cath yesterday showed relatively normal filling pressures -> will check 2V CXR and BNP this AM. O2 sat completey normal sitting upright - order written to ambulate pt and check O2 sat - - addendum: per nurse, pt ambulated in hallway maintaining 95-96% RA, BNP not incredibly impressive   3. Abnormal chest CT - pulmonary nodules, lymphadenopathy - will need OP f/u with PCP (for nodules recommendation to consider noncontrast CT scan in 12 months, for lymphadenopathy says recommend attention on  follow-up CT) - will need to discuss at DC to ensure pt aware of finding and plan for follow-up  4. Prolonged QT interval - K 3.5, will give 42meq now - repeat BMET, Mg now - hold further amiodarone, Lexapro, Effexor (med rec confirmed she is on both) - EKG in AM  5. Mild CAD by cath - hold off ASA given need for [facility] - add atorvastatin 20mg  daily for LDL goal <70 (LDL 115) - If the patient is tolerating statin at time of follow-up appointment, would consider rechecking liver function/lipid panel in 6-8 weeks.  6. Anxiety/depression - home Valium re-ordered - see prolonged QT   7. History of vaginal bleeding 3 years ago - typically concerning in a post-menopausal woman - no recent recurrence - regardless of anticoagulation decision would recommend continued f/u with primary care   8. Valvular heart disease - 2D echo with mild AI, mild-moderate MR, mild TR -> follow clinically   For questions or updates, please contact [facility] Please consult [email or web address] for contact info under Cardiology/STEMI.  Signed, Charlie Pitter, PA-C 10/25/2021, 8:48 AM    Patient seen and examined and agree with Melina Copa, PA-C as detailed above.  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 BNP 3670. TTE with EF 25-30% with severe global HK, mildly impared RV systolic function, moderate LAE, mildly enlarged RA, possible evidence of interatrial communication by color flow doppler, negative contrast study, mild AI, mild-moderate MR, mild TR, unable to estimate RVSP due to inadequate TR jet. She was subsequently transferred to [facility] for cath. Cath showed mild diffuse nonobstructive coronary plaquing (40% mLAD) and essentially normal intracardiac filling pressures  with wedge pressure of 13 mmHg, PA pressure 25/9 with a mean of 16 mmHg, and preserved cardiac output of 7.4 L/min. She was initiated on amiodarone bolus +gtt with successful conversion to NSR overnight.  Today, the patient feels much better. No chest pain or SOB. Blood pressures remain soft. Notably, Qtc 516 this AM and amiodarone stopped.  Unfortunately, limited antiarrhyhtmic options and GDMT limited due to hypotension and prolonged Qtc.   GEN: Elderly female, NAD   Neck: No JVD Cardiac: RRR, no murmurs, rubs, or gallops.  Respiratory: Clear to auscultation bilaterally. GI: Soft, nontender, non-distended  MS: No edema; No deformity. Neuro:  Nonfocal  Psych: Normal affect    Plan: -Patient converted to NSR this AM; no need for TEE/DCCV -Unfortunately, Qtc 516 with amiodarone gtt and therefore it has been held -Will reassess Qtc on ECG tomorrow and see if can add low dose PO amio  -Unable to add BB due to soft pressures; will add as able -Unable to add GDMT due to hypotension; will add as able as out-patient -Filling pressures normal, now off diuretics -Change heparin to xarelto for [facility] as she had bleeding with apxiaban in the past and refuses to re-trial -Continue lipitor 20mg  for mild nonobstructive CAD; no ASA given need for [facility] -Will need to consider adjusting anti-anxiety meds given prolonged Qtc as this limits antiarrhythmic options  Gwyndolyn Kaufman, MD

## 2021-10-25 NOTE — Progress Notes (Addendum)
[name] for heparin> xarelto Indication: atrial fibrillation  Allergies  Allergen Reactions   Compazine [Prochlorperazine Edisylate] Hives   Cortisone Other (See Comments)    unknown   Eliquis [Apixaban] Itching and Nausea Only    Pt does not recall itching/nausea as entered in chart in 2018 but states she had vaginal bleeding with this   Prednisone Nausea Only    Gives her a really bad headache   Prochlorperazine Other (See Comments)    unknown    Patient Measurements: Height: 5' 7"  (170.2 cm) Weight: 75 kg (165 lb 5.5 oz) IBW/kg (Calculated) : 61.6  Vital Signs: Temp: 97.8 F (36.6 C) (12/29 0607) Temp Source: Axillary (12/29 0607) BP: 103/57 (12/29 0607) Pulse Rate: 72 (12/29 0607)  Labs: Recent Labs    10/24/21 1018 10/24/21 1025 10/25/21 0118  HGB 13.9 13.6 13.3  HCT 41.0 40.0 40.6  PLT  --   --  233  HEPARINUNFRC  --   --  0.57  CREATININE  --   --  0.87     Estimated Creatinine Clearance: 52.7 mL/min (by C-G formula based on SCr of 0.87 mg/dL).   Medical History: Past Medical History:  Diagnosis Date   AML (acute myelogenous leukemia) ([place])    Anxiety    Atrial fibrillation (HCC)    Barrett's esophagus    Basal cell carcinoma of vulva (HCC)    Depression    Endometrial polyp    "polyps" per intake sheet   GERD (gastroesophageal reflux disease)    Hemorrhoids    Hypertension    Spinal stenosis    Weakness     Medications:  Facility-Administered Medications Prior to Admission  Medication Dose Route Frequency Provider Last Rate Last Admin   [DISCONTINUED] 0.9 %  sodium chloride infusion  500 mL Intravenous Once Irene Shipper, MD       Medications Prior to Admission  Medication Sig Dispense Refill Last Dose   ASHWAGANDHA PO Take 1 tablet by mouth daily.   Past Week   aspirin EC 81 MG tablet Take 81 mg by mouth daily.   10/22/2021   carboxymethylcellulose (REFRESH PLUS) 0.5 % SOLN Place 1 drop into both  eyes daily as needed (dry eyes).   Past Week   Cholecalciferol (VITAMIN D) 125 MCG (5000 UT) CAPS Take 5,000 Units by mouth daily.   10/22/2021   diazepam (VALIUM) 10 MG tablet Take 10 mg by mouth 2 (two) times daily. Takes an additional tablet if needed in the afternoon   10/22/2021   escitalopram (LEXAPRO) 20 MG tablet Take 20 mg by mouth daily.   10/22/2021   famotidine (PEPCID) 40 MG tablet Take 40 mg by mouth daily.   10/22/2021   ibuprofen (ADVIL) 200 MG tablet Take 400 mg by mouth every 6 (six) hours as needed for mild pain.   10/22/2021   Misc Natural Products (OSTEO BI-FLEX ADV TRIPLE ST) TABS Take 1 tablet by mouth daily.   Past Week   Multiple Vitamins-Minerals (PRESERVISION AREDS 2) CAPS Take 1 tablet by mouth daily.   Past Week   pantoprazole (PROTONIX) 40 MG tablet Take 1 tablet (40 mg total) by mouth daily. (Patient taking differently: Take 40 mg by mouth daily as needed (acid reflex).) 90 tablet 3 10/21/2021   venlafaxine XR (EFFEXOR-XR) 75 MG 24 hr capsule 75 mg daily.   10/22/2021   vitamin B-12 (CYANOCOBALAMIN) 1000 MCG tablet Take 1,000 mcg by mouth daily.   Past Week  Assessment: 80 yo female with SOB s/p cath with mild CAD. She is noted with afib to switch from heparin to Xarelto. She is noted with hx of vaginal bleeding.  -CrCl ~ 60 -Xarelto copay= $47 once her deductable is met   Goal of Therapy:  Heparin level 0.3-0.7 units/ml Monitor platelets by anticoagulation protocol: Yes   Plan:  -Xarelto 26m po daily -Will provide patient education  AHildred Laser PharmD Clinical Pharmacist **Pharmacist phone directory can now be found on [email or web address] ([email or web address]).  Listed under [place]  Addendum -plans for possible thoracentesis -heparin to restart -she did not get any rivaroxaban  Plan -restart heparin at 1000 units/hr -Heparin level in 8 hours and daily wth CBC daily  AHildred Laser PharmD Clinical Pharmacist **Pharmacist phone directory can now be found on  [email or web address] ([email or web address]).  [place]ed [place]   Addendum -no plans for thoracentesis -restart Xarelto at dinner and stop heparin at the same time  AHildred Laser PharmD Clinical Pharmacist **Pharmacist phone directory can now be found on [email or web address] ([email or web address]).  Listed under [place]

## 2021-10-25 NOTE — Progress Notes (Signed)
[place] for heparin Indication: atrial fibrillation  Allergies  Allergen Reactions   Compazine [Prochlorperazine Edisylate] Hives   Cortisone Other (See Comments)    unknown   Eliquis [Apixaban] Itching and Nausea Only    Pt does not recall itching/nausea as entered in chart in 2018 but states she had vaginal bleeding with this   Prednisone Nausea Only    Gives her a really bad headache   Prochlorperazine Other (See Comments)    unknown    Patient Measurements: Height: 5\' 7"  (170.2 cm) Weight: 74.1 kg (163 lb 5.8 oz) IBW/kg (Calculated) : 61.6  Vital Signs: Temp: 97.6 F (36.4 C) (12/29 0005) Temp Source: Oral (12/29 0005) BP: 105/81 (12/29 0005) Pulse Rate: 99 (12/29 0005)  Labs: Recent Labs    10/24/21 1018 10/24/21 1025 10/25/21 0118  HGB 13.9 13.6 13.3  HCT 41.0 40.0 40.6  PLT  --   --  233  HEPARINUNFRC  --   --  0.57  CREATININE  --   --  0.87    Estimated Creatinine Clearance: 52.4 mL/min (by C-G formula based on SCr of 0.87 mg/dL).   Medical History: Past Medical History:  Diagnosis Date   AML (acute myelogenous leukemia) ([place])    Anxiety    Atrial fibrillation (HCC)    Barrett's esophagus    Basal cell carcinoma of vulva (HCC)    Depression    Endometrial polyp    "polyps" per intake sheet   GERD (gastroesophageal reflux disease)    Hemorrhoids    Hypertension    Spinal stenosis    Weakness     Medications:  Facility-Administered Medications Prior to Admission  Medication Dose Route Frequency Provider Last Rate Last Admin   [DISCONTINUED] 0.9 %  sodium chloride infusion  500 mL Intravenous Once Irene Shipper, MD       Medications Prior to Admission  Medication Sig Dispense Refill Last Dose   ASHWAGANDHA PO Take 1 tablet by mouth daily.   Past Week   aspirin EC 81 MG tablet Take 81 mg by mouth daily.   10/22/2021   carboxymethylcellulose (REFRESH PLUS) 0.5 % SOLN Place 1 drop into both eyes daily  as needed (dry eyes).   Past Week   Cholecalciferol (VITAMIN D) 125 MCG (5000 UT) CAPS Take 5,000 Units by mouth daily.   10/22/2021   diazepam (VALIUM) 10 MG tablet Take 10 mg by mouth 2 (two) times daily. Takes an additional tablet if needed in the afternoon   10/22/2021   escitalopram (LEXAPRO) 20 MG tablet Take 20 mg by mouth daily.   10/22/2021   famotidine (PEPCID) 40 MG tablet Take 40 mg by mouth daily.   10/22/2021   ibuprofen (ADVIL) 200 MG tablet Take 400 mg by mouth every 6 (six) hours as needed for mild pain.   10/22/2021   Misc Natural Products (OSTEO BI-FLEX ADV TRIPLE ST) TABS Take 1 tablet by mouth daily.   Past Week   Multiple Vitamins-Minerals (PRESERVISION AREDS 2) CAPS Take 1 tablet by mouth daily.   Past Week   pantoprazole (PROTONIX) 40 MG tablet Take 1 tablet (40 mg total) by mouth daily. (Patient taking differently: Take 40 mg by mouth daily as needed (acid reflex).) 90 tablet 3 10/21/2021   venlafaxine XR (EFFEXOR-XR) 75 MG 24 hr capsule 75 mg daily.   10/22/2021   vitamin B-12 (CYANOCOBALAMIN) 1000 MCG tablet Take 1,000 mcg by mouth daily.   Past Week  Assessment: 80 yo female with SOB s/p cath with mild CAD. She is noted with afib to start heparin (2 hours post TR band removal)  12/29 AM update:  Heparin level therapeutic    Goal of Therapy:  Heparin level 0.3-0.7 units/ml Monitor platelets by anticoagulation protocol: Yes   Plan:  -Cont heparin 1050 units/hr -1200 heparin level  Narda Bonds, PharmD, BCPS Clinical Pharmacist Phone: [phone]

## 2021-10-25 NOTE — Care Management (Signed)
1113 10-25-21 Case Manager received consult for SNF placement. Staff RN stated that the patient ambulated the entire length of the hallway without any issues. Plan will be for home for this patient. Case Manager will continue to follow for additional disposition needs.

## 2021-10-25 NOTE — Consult Note (Signed)
NAME:  [name], MRN:  [id number], DOB:  03-17-1941, LOS: 1 ADMISSION DATE:  10/24/2021, CONSULTATION DATE:  12/29 REFERRING MD:  Johney Frame, REASON FOR CONSULT:  Pleural effusions   History of Present Illness:  [name] is an 80 y.o. F who presented to [facility] the AM of 12/27 with SOB. She was found to be in afib with RVR. Initial CT scan was negative for PE but was notable for pulmonary nodules and mediastinal/bilateral lymphadenopathy. BNP 3670, troponin negative. She was admitted.  Initial workup demonstrated an EF of 25-30% with severe global HK, mildly impaired, RVSF. She was transferred to [facility] for cath. Cath at [facility] demonstrated NICM. The Mid LAD was 40% stenosed. Wedge pressure 13, PA 25/9, mean 16, CO 7.4L. She remained in afib with RVR. 12/29 BNP 181. Creat 0.87. 12/29 CXR with bilateral pulmonary effusions. Afib converted to NSR on 12/29 while on IV amio.  PCCM has been consulted for evaluation for thoracentesis  Pertinent  Medical History  ?prior afib, leukemia (1990's AML with bone marrow graft), anxiety, depression, GERD, HTN  Subjective: current denies cough, sob  Significant Hospital Events: Including procedures, antibiotic start and stop dates in addition to other pertinent events   12/27 Admit 12/29 PCCM consult  Interim History / Subjective:  See above  Objective   Blood pressure (!) 103/57, pulse 72, temperature 97.8 F (36.6 C), temperature source Axillary, resp. rate 18, height 5\' 7"  (1.702 m), weight 75 kg, SpO2 93 %.    FiO2 (%):  [28 %] 28 %   Intake/Output Summary (Last 24 hours) at 10/25/2021 1238 Last data filed at 10/25/2021 0300 Gross per 24 hour  Intake 873.52 ml  Output --  Net 873.52 ml   Filed Weights   10/24/21 0930 10/24/21 1329 10/25/21 0607  Weight: 80 kg 74.1 kg 75 kg    Examination: General: In bed, NAD, appears comfortable HEENT: MM pink/moist, anicteric, atraumatic Neuro: RASS 0, Pupils  30mm, A&O x4 CV:  S1S2, NSR, no m/r/g appreciated PULM:  trachea midline, chest expansion symmetric, see [place] below, Room air Skin:  no rashes or lesions noted   Left lung above      Right lung above (two views)  Resolved Hospital Problem list     Assessment & Plan:  Bilateral pleural effusions Per noted patient ambulating in hallways maintaining 95-96 on RA. Tmax 98.7, WBC 7.5. No signs of infection. See [place] above. Not enough fluid for thoracentesis. On RA. -No thora at this time -Follow up in 1 month with [facility]. CXR at visit. -Ok to start DOAC  Abnormal chest CT Pulmonary nodules with lymphadenopathy -Recommend non contrast CT scan in 6 months.  -Follow up outpatient with PCP  All other issues per primary -Afib with RVR -Prolonged Qt -CAD -Anxiety/depression -Mild AI/Moderate MR/Mild TR  Best Practice (right click and "Reselect all SmartList Selections" daily)  Per primary   Labs   CBC: Recent Labs  Lab 10/24/21 1015 10/24/21 1018 10/24/21 1025 10/25/21 0118  WBC  --   --   --  7.5  HGB 14.3 13.9 13.6 13.3  HCT 42.0 41.0 40.0 40.6  MCV  --   --   --  90.0  PLT  --   --   --  341    Basic Metabolic Panel: Recent Labs  Lab 10/24/21 1015 10/24/21 1018 10/24/21 1025 10/25/21 0118  NA 141 142 127* 138  K 3.4* 3.2* 3.1* 3.5  CL  --   --   --  102  CO2  --   --   --  26  GLUCOSE  --   --   --  107*  BUN  --   --   --  17  CREATININE  --   --   --  0.87  CALCIUM  --   --   --  9.0   GFR: Estimated Creatinine Clearance: 52.7 mL/min (by C-G formula based on SCr of 0.87 mg/dL). Recent Labs  Lab 10/25/21 0118  WBC 7.5    Liver Function Tests: No results for input(s): AST, ALT, ALKPHOS, BILITOT, PROT, ALBUMIN in the last 168 hours. No results for input(s): LIPASE, AMYLASE in the last 168 hours. No results for input(s): AMMONIA in the last 168 hours.  ABG    Component Value Date/Time   PHART 7.359 10/24/2021 1025   PCO2ART 48.0 10/24/2021 1025   PO2ART 64 (L)  10/24/2021 1025   HCO3 27.1 10/24/2021 1025   TCO2 29 10/24/2021 1025   O2SAT 91.0 10/24/2021 1025     Coagulation Profile: No results for input(s): INR, PROTIME in the last 168 hours.  Cardiac Enzymes: No results for input(s): CKTOTAL, CKMB, CKMBINDEX, TROPONINI in the last 168 hours.  HbA1C: No results found for: HGBA1C  CBG: No results for input(s): GLUCAP in the last 168 hours.  Review of Systems:   Positives in bold  Gen: fever, chills, weight change, fatigue (PTA), night sweats HEENT:  blurred vision, double vision, hearing loss, tinnitus, sinus congestion, rhinorrhea, sore throat, neck stiffness, dysphagia PULM:  shortness of breath (PTA), cough, sputum production, hemoptysis, wheezing CV: chest pain, edema, orthopnea, paroxysmal nocturnal dyspnea, palpitations (PTA) GI:  abdominal pain, nausea, vomiting, diarrhea, hematochezia, melena, constipation, change in bowel habits GU: dysuria, hematuria, polyuria, oliguria, urethral discharge Endocrine: hot or cold intolerance, polyuria, polyphagia or appetite change Derm: rash, dry skin, scaling or peeling skin change Heme: easy bruising, bleeding, bleeding gums Neuro: headache, numbness, weakness, slurred speech, loss of memory or consciousness   Past Medical History:  She,  has a past medical history of AML (acute myelogenous leukemia) ([place]), Anxiety, Atrial fibrillation ([place]), Barrett's esophagus, Basal cell carcinoma of vulva ([place]), Depression, Endometrial polyp, GERD (gastroesophageal reflux disease), Hemorrhoids, Hypertension, Spinal stenosis, and Weakness.   Surgical History:   Past Surgical History:  Procedure Laterality Date   CATARACT EXTRACTION, BILATERAL     august/september 2017   cataract surgery     CERVICAL POLYPECTOMY N/A 08/14/2015   Procedure: CERVICAL POLYPECTOMY;  Surgeon: Newton Pigg, MD;  Location: [facility];  Service: Gynecology;  Laterality: N/A;   CHOLECYSTECTOMY     endometrial polyp surgery      HEMORROIDECTOMY     HYSTEROSCOPY WITH D & C N/A 08/14/2015   Procedure: DILATATION AND CURETTAGE /HYSTEROSCOPY;  Surgeon: Newton Pigg, MD;  Location: [facility];  Service: Gynecology;  Laterality: N/A;   KNEE SURGERY Right    PORTACATH PLACEMENT     RIGHT/LEFT HEART CATH AND CORONARY ANGIOGRAPHY N/A 10/24/2021   Procedure: RIGHT/LEFT HEART CATH AND CORONARY ANGIOGRAPHY;  Surgeon: Sherren Mocha, MD;  Location: [facility];  Service: Cardiovascular;  Laterality: N/A;   TUNNELED VENOUS CATHETER PLACEMENT       Social History:   reports that she has never smoked. She has never used smokeless tobacco. She reports that she does not drink alcohol and does not use drugs.   Family History:  Her family history includes Alzheimer's disease in her brother; Breast cancer in her paternal aunt; CVA  in her mother; Diabetes in her mother; Heart attack in her brother and mother; Heart disease in her brother and mother; Lymphoma in her father; Non-Hodgkin's lymphoma in her father. There is no history of Colon cancer.   Allergies Allergies  Allergen Reactions   Compazine [Prochlorperazine Edisylate] Hives   Cortisone Other (See Comments)    unknown   Eliquis [Apixaban] Itching and Nausea Only    Pt does not recall itching/nausea as entered in chart in 2018 but states she had vaginal bleeding with this   Prednisone Nausea Only    Gives her a really bad headache   Prochlorperazine Other (See Comments)    unknown     Home Medications  Prior to Admission medications   Medication Sig Start Date End Date Taking? Authorizing Provider  ASHWAGANDHA PO Take 1 tablet by mouth daily.   Yes [provider]  aspirin EC 81 MG tablet Take 81 mg by mouth daily.   Yes [provider]  carboxymethylcellulose (REFRESH PLUS) 0.5 % SOLN Place 1 drop into both eyes daily as needed (dry eyes).   Yes [provider]  Cholecalciferol (VITAMIN D) 125 MCG (5000 UT) CAPS Take 5,000 Units by  mouth daily.   Yes [provider]  diazepam (VALIUM) 10 MG tablet Take 10 mg by mouth 2 (two) times daily. Takes an additional tablet if needed in the afternoon 02/27/16  Yes [provider]  escitalopram (LEXAPRO) 20 MG tablet Take 20 mg by mouth daily. 10/13/21  Yes [provider]  famotidine (PEPCID) 40 MG tablet Take 40 mg by mouth daily. 10/22/21  Yes [provider]  ibuprofen (ADVIL) 200 MG tablet Take 400 mg by mouth every 6 (six) hours as needed for mild pain.   Yes [provider]  Misc Natural Products (OSTEO BI-FLEX ADV TRIPLE ST) TABS Take 1 tablet by mouth daily.   Yes [provider]  Multiple Vitamins-Minerals (PRESERVISION AREDS 2) CAPS Take 1 tablet by mouth daily.   Yes [provider]  pantoprazole (PROTONIX) 40 MG tablet Take 1 tablet (40 mg total) by mouth daily. Patient taking differently: Take 40 mg by mouth daily as needed (acid reflex). 07/21/19  Yes Irene Shipper, MD  venlafaxine XR (EFFEXOR-XR) 75 MG 24 hr capsule 75 mg daily.   Yes [provider]  vitamin B-12 (CYANOCOBALAMIN) 1000 MCG tablet Take 1,000 mcg by mouth daily.   Yes [provider]     Critical care time: n/a    [employer]., MSN, APRN, AGACNP-BC [facility]  10/25/2021 , 12:38 PM  Please see [email or web address] for pager details  If no response, please call [phone] After hours, please call Elink at [phone]

## 2021-10-25 NOTE — Telephone Encounter (Signed)
Assessment & Plan:  Bilateral pleural effusions Per noted patient ambulating in hallways maintaining 95-96 on RA. Tmax 98.7, WBC 7.5. No signs of infection. See [place] above. Not enough fluid for thoracentesis. On RA. -No thora at this time -Follow up in 1 month with [facility]. CXR at visit.    With pt still in the hospital, have scheduled pt for a follow up with Dr. Valeta Harms in 1 month and have also placed order for cxr to happen prior. Pt's appt will show up on AVS once discharged. Nothing further needed.

## 2021-10-26 ENCOUNTER — Encounter (HOSPITAL_COMMUNITY): Payer: Self-pay | Admitting: Cardiovascular Disease

## 2021-10-26 ENCOUNTER — Other Ambulatory Visit (HOSPITAL_COMMUNITY): Payer: Self-pay

## 2021-10-26 LAB — CBC
HCT: 38 % (ref 36.0–46.0)
Hemoglobin: 12.7 g/dL (ref 12.0–15.0)
MCH: 30.2 pg (ref 26.0–34.0)
MCHC: 33.4 g/dL (ref 30.0–36.0)
MCV: 90.3 fL (ref 80.0–100.0)
Platelets: 201 10*3/uL (ref 150–400)
RBC: 4.21 MIL/uL (ref 3.87–5.11)
RDW: 14 % (ref 11.5–15.5)
WBC: 7.6 10*3/uL (ref 4.0–10.5)
nRBC: 0 % (ref 0.0–0.2)

## 2021-10-26 LAB — BASIC METABOLIC PANEL
Anion gap: 7 (ref 5–15)
BUN: 16 mg/dL (ref 8–23)
CO2: 25 mmol/L (ref 22–32)
Calcium: 9.3 mg/dL (ref 8.9–10.3)
Chloride: 104 mmol/L (ref 98–111)
Creatinine, Ser: 0.88 mg/dL (ref 0.44–1.00)
GFR, Estimated: >60 mL/min (ref >60)
Glucose, Bld: 108 mg/dL — ABNORMAL HIGH (ref 70–99)
Potassium: 4 mmol/L (ref 3.5–5.1)
Sodium: 136 mmol/L (ref 135–145)

## 2021-10-26 SURGERY — ECHOCARDIOGRAM, TRANSESOPHAGEAL
Anesthesia: General

## 2021-10-26 MED ORDER — METOPROLOL SUCCINATE ER 25 MG PO TB24: 12.5000 mg | ORAL_TABLET | Freq: Every day | ORAL | Status: DC

## 2021-10-26 MED ORDER — ATORVASTATIN CALCIUM 20 MG PO TABS
20.0000 mg | ORAL_TABLET | Freq: Every day | ORAL | 11 refills | Status: DC
  Filled 2021-10-26: qty 30, 30d supply, fill #0

## 2021-10-26 MED ORDER — FUROSEMIDE 20 MG PO TABS: 20.0000 mg | ORAL_TABLET | Freq: Every day | ORAL | Status: DC

## 2021-10-26 MED ORDER — FUROSEMIDE 20 MG PO TABS
20.0000 mg | ORAL_TABLET | Freq: Every day | ORAL | 6 refills | Status: DC
  Filled 2021-10-26: qty 30, 30d supply, fill #0

## 2021-10-26 MED ORDER — RIVAROXABAN 20 MG PO TABS
20.0000 mg | ORAL_TABLET | Freq: Every day | ORAL | 11 refills | Status: DC
  Filled 2021-10-26: qty 30, 30d supply, fill #0

## 2021-10-26 MED ORDER — METOPROLOL SUCCINATE ER 25 MG PO TB24
12.5000 mg | ORAL_TABLET | Freq: Every day | ORAL | 6 refills | Status: DC
  Filled 2021-10-26: qty 15, 30d supply, fill #0

## 2021-10-26 NOTE — Plan of Care (Signed)

## 2021-10-26 NOTE — Discharge Instructions (Signed)

## 2021-10-26 NOTE — Discharge Summary (Addendum)
Discharge Summary    Patient ID: [name] MRN: [id number]; DOB: 17-Sep-1941  Admit date: 10/24/2021 Discharge date: 10/26/2021  PCP:  Street, [name], MD   [place] Providers Cardiologist:  None      New to Cards, follow up in [place]  Discharge Diagnoses    Principal Problem:   Acute systolic heart failure [facility]) Active Problems:   Atrial fibrillation with rapid ventricular response [facility])    Diagnostic Studies/Procedures    LHC 10/24/21 Mid LAD lesion is 40% stenosed.   1.  Mild diffuse nonobstructive coronary plaquing. 2.  Essentially normal intracardiac filling pressures with wedge pressure of 13 mmHg, PA pressure 25/9 with a mean of 16 mmHg, and preserved cardiac output of 7.4 L/min   Recommend: Patient appears to have nonischemic cardiomyopathy, likely tachycardia-mediated. Patient remains in atrial fibrillation with RVR.  I do not think her blood pressure will tolerate aggressive beta-blocker titration.  Calcium channel blockers are contraindicated in the setting of her severe cardiomyopathy.  Would start IV amiodarone and consider TEE/cardioversion per primary team.  We will start on IV heparin post procedure with plans to transition to oral anticoagulation per primary team.   [facility]   2D Echo 10/23/21 EF 25-30% with severe global HK, mildly impared RV systolic function, moderate LAE, mildly enlarged RA, possible evidence of interatrial communication by color flow doppler, negative contrast study, mild AI, mild-moderate MR, mild TR, unable to estimate RVSP due to inadequate TR jet _____________   History of Present Illness     [name] is a 80 y.o. female with remote leukemia (AML tx in the 1990s with bone marrow graft in [place] per pt), anxiety, depression, GERD, ?prior atrial fibrillation (details unclear) who was admitted 10/24/2021 for atrial fibrillation and new cardiomyopathy from [facility]      Consultants: CCM  1. Atrial fibrillation with RVR -Unknown duration - Spontaneously converted to sinus rhythm on IV amiodarone,  -Because of prolonged QTC, amiodarone was DC'd on 12/29 -Is on Xarelto 20 mg daily, first dose 12/29, she had bleeding with Eliquis and refuses to retry - TSH normal - she did not tolerate carvedilol due to hypotension -We will add Toprol-XL 12.5 mg daily, can take at bedtime   2. Acute systolic CHF/NICM with intermittent borderline low O2 sats, EF 25% -Negative for respiratory illnesses such as COVID on admission - diuresed and sx improved, but still had pleural effusions on CT - Blood pressure has improved a bit since she converted to sinus rhythm, feel she can tolerate low-dose Lasix, will start 20 mg daily - Upper lobe nodules seen on CT, repeat in 12 months - Blood pressure limits GDMT - We will try low-dose Toprol XL, can take at bedtime - Oxygen sats with ambulation were 95-96%, does not qualify for home O2 -Had some hypoxia while lying in the bed 12/29, but this has not happened again.  Consider evaluation for sleep apnea as an outpatient.  3. Abnormal chest CT -Repeat noncontrast CT in 12 months - She is a never smoker, so risk is low from that standpoint - Unclear if chemotherapy for AML could have caused the nodules or if it would put her at increased risk for lung cancer -Seen by Pulmonary yesterday and no collection of fluid appropriate for thoracentesis - She is to follow-up with them in a month, she is to get a chest x-ray at that time   4. Prolonged QT interval -Potassium supplemented and improved -  No change after amnio discontinued here, but it was started at [place]. -She was on Lexapro and Effexor prior to admission, these were held on admission - She is to stay off the Lexapro, the Effexor can be resumed at discharge.   5. Mild CAD by cath -No ASA since on Xarelto - Lipitor 20 mg daily added, LDL was 115 at [place], goal LDL less  than 70 - Recheck lipids and liver 6-8 weeks as an outpatient   6. Anxiety/depression -On home dose of Valium - Lexapro and Effexor initially held due to QT prolongation, but she has been on them with no dose change for a long time - Avoid other QT prolonging drugs but continue home meds   7. History of vaginal bleeding 3 years ago -Have been in the setting of being on Eliquis - Watch for recurrence on Xarelto   8. Valvular heart disease --Follow clinically  On 12/30 she was seen by Dr. Johney Frame and all data were reviewed.  Inpatient work-up is considered stable for discharge in [place].  Did the patient have an acute coronary syndrome (MI, NSTEMI, STEMI, etc) this admission?:  No                               Did the patient have a percutaneous coronary intervention (stent / angioplasty)?:  No.      _____________  Discharge Vitals Blood pressure 113/61, pulse 73, temperature 98 F (36.7 C), temperature source Oral, resp. rate 18, height 5\' 7"  (1.702 m), weight 78.1 kg, SpO2 92 %.  Filed Weights   10/24/21 1329 10/25/21 0607 10/26/21 0521  Weight: 74.1 kg 75 kg 78.1 kg    Labs & Radiologic Studies    CBC Recent Labs    10/25/21 0118 10/26/21 0237  WBC 7.5 7.6  HGB 13.3 12.7  HCT 40.6 38.0  MCV 90.0 90.3  PLT 233 993   Basic Metabolic Panel Recent Labs    10/25/21 1159 10/26/21 0237  NA 137 136  K 3.9 4.0  CL 100 104  CO2 28 25  GLUCOSE 111* 108*  BUN 17 16  CREATININE 0.96 0.88  CALCIUM 9.2 9.3  MG 2.0  --    Liver Function Tests No results for input(s): AST, ALT, ALKPHOS, BILITOT, PROT, ALBUMIN in the last 72 hours. No results for input(s): LIPASE, AMYLASE in the last 72 hours. High Sensitivity Troponin:   No results for input(s): TROPONINIHS in the last 720 hours.  BNP Invalid input(s): POCBNP D-Dimer No results for input(s): DDIMER in the last 72 hours. Hemoglobin A1C No results for input(s): HGBA1C in the last 72 hours. Fasting Lipid  Panel No results for input(s): CHOL, HDL, LDLCALC, TRIG, CHOLHDL, LDLDIRECT in the last 72 hours. Thyroid Function Tests Recent Labs    10/25/21 0118  TSH 0.927   _____________  DG Chest 2 View  Result Date: 10/25/2021 CLINICAL DATA:  Hypoxia EXAM: CHEST - 2 VIEW COMPARISON:  10/23/2021 FINDINGS: Unchanged cardiac and mediastinal contours. Redemonstrated moderate right and small left pleural effusions with associated atelectasis. Mild interstitial pulmonary edema. No acute osseous abnormality. IMPRESSION: Persistent pulmonary edema with right-greater-than-left pleural effusions. Electronically Signed   By: Merilyn Baba M.D.   On: 10/25/2021 11:05   CARDIAC CATHETERIZATION  Result Date: 10/24/2021   Mid LAD lesion is 40% stenosed. 1.  Mild diffuse nonobstructive coronary plaquing. 2.  Essentially normal intracardiac filling pressures with wedge pressure of 13 mmHg,  PA pressure 25/9 with a mean of 16 mmHg, and preserved cardiac output of 7.4 L/min Recommend: Patient appears to have nonischemic cardiomyopathy, likely tachycardia-mediated. Patient remains in atrial fibrillation with RVR.  I do not think her blood pressure will tolerate aggressive beta-blocker titration.  Calcium channel blockers are contraindicated in the setting of her severe cardiomyopathy.  Would start IV amiodarone and consider TEE/cardioversion per primary team.  We will start on IV heparin post procedure with plans to transition to oral anticoagulation per primary team.   Disposition   Pt is being discharged home today in good condition.  Follow-up Plans & Appointments     Follow-up Information     [facility] Follow up.   Specialty: Cardiology Why: The office will call. Contact information: [place] [id number] [phone]               Discharge Instructions     Diet - low sodium heart healthy   Complete by: As directed    Increase activity slowly    Complete by: As directed        Discharge Medications   Allergies as of 10/26/2021       Reactions   Compazine [prochlorperazine Edisylate] Hives   Cortisone Other (See Comments)   unknown   Eliquis [apixaban] Itching, Nausea Only   Pt does not recall itching/nausea as entered in chart in 2018 but states she had vaginal bleeding with this   Prednisone Nausea Only   Gives her a really bad headache   Prochlorperazine Other (See Comments)   unknown        Medication List     STOP taking these medications    ASHWAGANDHA PO   aspirin EC 81 MG tablet   escitalopram 20 MG tablet Commonly known as: LEXAPRO   ibuprofen 200 MG tablet Commonly known as: ADVIL       TAKE these medications    atorvastatin 20 MG tablet Commonly known as: LIPITOR Take 1 tablet (20 mg total) by mouth daily. Start taking on: October 27, 2021   carboxymethylcellulose 0.5 % Soln Commonly known as: REFRESH PLUS Place 1 drop into both eyes daily as needed (dry eyes).   diazepam 10 MG tablet Commonly known as: VALIUM Take 10 mg by mouth 2 (two) times daily. Takes an additional tablet if needed in the afternoon   famotidine 40 MG tablet Commonly known as: PEPCID Take 40 mg by mouth daily.   furosemide 20 MG tablet Commonly known as: LASIX Take 1 tablet (20 mg total) by mouth daily.   metoprolol succinate 25 MG 24 hr tablet Commonly known as: TOPROL-XL Take 0.5 tablets (12.5 mg total) by mouth daily.   Osteo Bi-Flex Adv Triple St Tabs Take 1 tablet by mouth daily.   pantoprazole 40 MG tablet Commonly known as: PROTONIX Take 1 tablet (40 mg total) by mouth daily. What changed:  when to take this reasons to take this   PreserVision AREDS 2 Caps Take 1 tablet by mouth daily.   rivaroxaban 20 MG Tabs tablet Commonly known as: XARELTO Take 1 tablet (20 mg total) by mouth daily with supper.   venlafaxine XR 75 MG 24 hr capsule Commonly known as: EFFEXOR-XR 75 mg daily.    vitamin B-12 1000 MCG tablet Commonly known as: CYANOCOBALAMIN Take 1,000 mcg by mouth daily.   Vitamin D 125 MCG (5000 UT) Caps Take 5,000 Units by mouth daily.  Outstanding Labs/Studies   None  Duration of Discharge Encounter   Greater than 30 minutes including physician time.  Signed, Rosaria Ferries, PA-C 10/26/2021, 12:39 PM   Patient seen and examined and agree with Rosaria Ferries, PA-C as detailed above. Patient is back in NSR. GDMT limited due to hypotension. Unable to add amiodarone for Afib due to prolonged Qtc in the 500s. Added metop and lasix today with plans for close follow-up after discharge. Please see progress note from today for full details.  Gwyndolyn Kaufman, MD

## 2021-10-26 NOTE — Progress Notes (Addendum)
Progress Note  Patient Name: [name] Date of Encounter: 10/26/2021  Primary Cardiologist: New [facility], will need f/u in [place]  Subjective   Resting comfortably lying flat in bed, denies chest pain or shortness of breath. Ambulated the hallways twice without issues. States she is ready to go home.  Inpatient Medications    Scheduled Meds:  atorvastatin  20 mg Oral Daily   cholecalciferol  5,000 Units Oral Daily   diazepam  10 mg Oral BID   multivitamin  1 tablet Oral Daily   pantoprazole  40 mg Oral Daily   rivaroxaban  20 mg Oral Q supper   sodium chloride flush  3 mL Intravenous Q12H   vitamin B-12  1,000 mcg Oral Daily   Continuous Infusions:  sodium chloride     PRN Meds: sodium chloride, acetaminophen, famotidine, polyvinyl alcohol, sodium chloride flush   Vital Signs    Vitals:   10/25/21 0607 10/25/21 1502 10/25/21 2134 10/26/21 0521  BP: (!) 103/57 112/73 (!) 114/57 113/61  Pulse: 72 74 78 73  Resp: 18 16 17 18   Temp: 97.8 F (36.6 C) 98.1 F (36.7 C) 97.7 F (36.5 C) 98 F (36.7 C)  TempSrc: Axillary Oral Oral Oral  SpO2: 93% 93% 93% 92%  Weight: 75 kg   78.1 kg  Height:        Intake/Output Summary (Last 24 hours) at 10/26/2021 0754 Last data filed at 10/25/2021 1836 Gross per 24 hour  Intake 143.46 ml  Output --  Net 143.46 ml   Last 3 Weights 10/26/2021 10/25/2021 10/24/2021  Weight (lbs) 172 lb 2.9 oz 165 lb 5.5 oz 163 lb 5.8 oz  Weight (kg) 78.1 kg 75 kg 74.1 kg     Telemetry    Maintaining sinus rhythm- Personally Reviewed  ECG    Sinus rhythm, heart rate 71, QTc 512 - Personally Reviewed  Physical Exam   GEN: No acute distress.   Neck: JVD mild Cardiac: RRR, no murmur, no rubs, or gallops.  Respiratory: rales in the bases. GI: Soft, nontender, non-distended  MS: No edema; No deformity.  Right radial cath site without ecchymosis or hematoma, 4/4 extremity pulses intact Neuro:  Nonfocal  Psych: Normal affect    Labs    High Sensitivity Troponin:  No results for input(s): TROPONINIHS in the last 720 hours.    Cardiac EnzymesNo results for input(s): TROPONINI in the last 168 hours. No results for input(s): TROPIPOC in the last 168 hours.   Chemistry Recent Labs  Lab 10/25/21 0118 10/25/21 1159 10/26/21 0237  NA 138 137 136  K 3.5 3.9 4.0  CL 102 100 104  CO2 26 28 25   GLUCOSE 107* 111* 108*  BUN 17 17 16   CREATININE 0.87 0.96 0.88  CALCIUM 9.0 9.2 9.3  GFRNONAA >60 59* >60  ANIONGAP 10 9 7      Hematology Recent Labs  Lab 10/24/21 1025 10/25/21 0118 10/26/21 0237  WBC  --  7.5 7.6  RBC  --  4.51 4.21  HGB 13.6 13.3 12.7  HCT 40.0 40.6 38.0  MCV  --  90.0 90.3  MCH  --  29.5 30.2  MCHC  --  32.8 33.4  RDW  --  14.1 14.0  PLT  --  233 201    BNP Recent Labs  Lab 10/25/21 0118  BNP 181.3*    No results found for: CHOL, HDL, LDLCALC, LDLDIRECT, TRIG, CHOLHDL   DDimer No results for input(s): DDIMER in  the last 168 hours.   Radiology    DG Chest 2 View  Result Date: 10/25/2021 CLINICAL DATA:  Hypoxia EXAM: CHEST - 2 VIEW COMPARISON:  10/23/2021 FINDINGS: Unchanged cardiac and mediastinal contours. Redemonstrated moderate right and small left pleural effusions with associated atelectasis. Mild interstitial pulmonary edema. No acute osseous abnormality. IMPRESSION: Persistent pulmonary edema with right-greater-than-left pleural effusions. Electronically Signed   By: Merilyn Baba M.D.   On: 10/25/2021 11:05   CARDIAC CATHETERIZATION  Result Date: 10/24/2021   Mid LAD lesion is 40% stenosed. 1.  Mild diffuse nonobstructive coronary plaquing. 2.  Essentially normal intracardiac filling pressures with wedge pressure of 13 mmHg, PA pressure 25/9 with a mean of 16 mmHg, and preserved cardiac output of 7.4 L/min Recommend: Patient appears to have nonischemic cardiomyopathy, likely tachycardia-mediated. Patient remains in atrial fibrillation with RVR.  I do not think her  blood pressure will tolerate aggressive beta-blocker titration.  Calcium channel blockers are contraindicated in the setting of her severe cardiomyopathy.  Would start IV amiodarone and consider TEE/cardioversion per primary team.  We will start on IV heparin post procedure with plans to transition to oral anticoagulation per primary team.    Cardiac Studies   LHC 10/24/21 Mid LAD lesion is 40% stenosed.   1.  Mild diffuse nonobstructive coronary plaquing. 2.  Essentially normal intracardiac filling pressures with wedge pressure of 13 mmHg, PA pressure 25/9 with a mean of 16 mmHg, and preserved cardiac output of 7.4 L/min   Recommend: Patient appears to have nonischemic cardiomyopathy, likely tachycardia-mediated. Patient remains in atrial fibrillation with RVR.  I do not think her blood pressure will tolerate aggressive beta-blocker titration.  Calcium channel blockers are contraindicated in the setting of her severe cardiomyopathy.  Would start IV amiodarone and consider TEE/cardioversion per primary team.  We will start on IV heparin post procedure with plans to transition to oral anticoagulation per primary team.   [facility]   2D Echo 10/23/21 EF 25-30% with severe global HK, mildly impared RV systolic function, moderate LAE, mildly enlarged RA, possible evidence of interatrial communication by color flow doppler, negative contrast study, mild AI, mild-moderate MR, mild TR, unable to estimate RVSP due to inadequate TR jet   Patient Profile     80 y.o. 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 Atrial fibrillation with RVR -Unknown duration - Spontaneously converted to sinus rhythm on IV amiodarone, DC'd on  12/29 -Is on Xarelto 20 mg daily, first dose 12/29 - TSH normal - Blood pressure systolic 1 teens for the last 24 hours, discuss adding Toprol-XL 12.5 or 25 mg/day with MD, she did not tolerate carvedilol due to hypotension   2. Acute systolic CHF/NICM with intermittent borderline low O2 sats, EF 25% -Negative for respiratory illnesses on admission - CT 12/27 + pulmonary edema - Blood pressure has improved a bit since she converted to sinus rhythm, discuss starting Lasix, either IV or p.o. with MD - Upper lobe nodules seen on CT, repeat in 12 months - Blood pressure limits GDMT -Discuss trying low-dose Toprol - sats with ambulation 95-96%, does not qualify for home O2 -Reviewed chest x-ray with MD to see if decubitus films needed  3. Abnormal chest CT -Repeat noncontrast  CT in 12 months - She is a never smoker, so risk is low from that standpoint - Unclear if chemotherapy for AML could have caused the nodules or if it would put her at increased risk for lung cancer  4. Prolonged QT interval -Potassium supplemented and improved -No change after amnio discontinued -She was on Lexapro and Effexor prior to admission, hold these -MD advise if we should curbside EP as she has been on these meds for a while without side effects  5. Mild CAD by cath -No ASA since on Xarelto - Lipitor 20 mg daily added, LDL was 115 - Recheck lipids and liver 6-8 weeks  6. Anxiety/depression -On home dose of Valium - Lexapro held due to QT   7. History of vaginal bleeding 3 years ago -Have been in the setting of being on Eliquis - Watch for recurrence on Xarelto -   8. Valvular heart disease --Follow clinically   For questions or updates, please contact [place] Please consult [email or web address] for contact info under Cardiology/STEMI.  Signed, Rosaria Ferries, PA-C 10/26/2021, 7:54 AM    Patient seen and examined and agree with Rosaria Ferries, PA-C as detailed above.  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 BNP 3670. TTE with EF 25-30% with severe global HK, mildly impared RV systolic function, moderate LAE, mildly enlarged RA, mild AI, mild-moderate MR, mild TR. She was subsequently transferred to [facility] for cath. Cath showed mild diffuse nonobstructive coronary plaquing (40% mLAD) and essentially normal intracardiac filling pressures with wedge pressure of 13 mmHg, PA pressure 25/9 with a mean of 16 mmHg, and preserved cardiac output of 7.4 L/min. She was initiated on amiodarone bolus +gtt with successful conversion to NSR. GDMT has been limited due to soft blood pressures and amiodarone discontinued due to Qtc in 500s.    Pulm was consulted yesterday due to bilateral pleural effusions and lymphadenopathy. Seen by pulm and fluid pocket too small to perform thora. Suspected nodule were benign. Recommended follow-up as out-patient.   Today, the patient is doing well. BP slightly improved. Will add BB and low dose diuretic today.   GEN: Elderly female, NAD   Neck: No JVD Cardiac: RRR, no murmurs, rubs, or gallops.  Respiratory: Clear to auscultation bilaterally. GI: Soft, nontender, non-distended  MS: No edema; No deformity. Neuro:  Nonfocal  Psych: Normal affect     Plan: -Start metoprolol 12.5mg  XL daily  -Start lasix 20mg  PO daily for maintenance diuretic -Unfortunately, Qtc remains in 500s and cannot resume amiodarone -GDMT limited due to soft pressures; can continue to titrate as an out-patient -Continue xarelto for [facility] as she had bleeding with apxiaban in the past and refuses to re-trial -Continue lipitor 20mg  for mild nonobstructive CAD; no ASA given need for [facility] -Will need to consider adjusting anti-anxiety meds given prolonged  Qtc as this limits antiarrhythmic options -Will discharge home today   Gwyndolyn Kaufman, MD

## 2021-10-26 NOTE — Progress Notes (Signed)
Heart Failure Nurse Navigator Progress Note  PCP: Street, [name], MD PCP-Cardiologist: none Admission Diagnosis: CHF Admitted from: home with spouse  Presentation:   [name] presented 12/28 as a transfer from [facility] for increased SOB and new CM and AF(?). Pt planned DC today, pt has already received AVS paperwork, awaiting family to provide transportation. Patient interactive with interview process. Pt lives in [place] with her husband, she still drives. Prefers not to drive a night/dark. Briefly introduced heart failure and potential for medications to change over the next few weeks.   Explained benefits of Heart & Vascular Transitions of Care Clinic appointment, patient agreeable.     ECHO/ LVEF: Outside Hospital   2D Echo 10/23/21 EF 25-30% with severe global HK, mildly impared RV systolic function, moderate LAE, mildly enlarged RA, possible evidence of interatrial communication by color flow doppler, negative contrast study, mild AI, mild-moderate MR, mild TR, unable to estimate RVSP due to inadequate TR jet  Clinical Course:  Past Medical History:  Diagnosis Date   AML (acute myelogenous leukemia) ([place])    Anxiety    Atrial fibrillation (HCC)    Barrett's esophagus    Basal cell carcinoma of vulva (HCC)    Depression    Endometrial polyp    "polyps" per intake sheet   GERD (gastroesophageal reflux disease)    Hemorrhoids    Hypertension    Spinal stenosis    Weakness      Social History   Socioeconomic History   Marital status: Married    Spouse name: Aldona Bryner   Number of children: 1   Years of education: Not on file   Highest education level: High school graduate  Occupational History   Occupation: retired  Tobacco Use   Smoking status: Never   Smokeless tobacco: Never  Vaping Use   Vaping Use: Never used  Substance and Sexual Activity   Alcohol use: No   Drug use: No   Sexual activity: Not on file  Other Topics Concern   Not  on file  Social History Narrative   Lives at home with husband   Right handed   Caffeine: quit years ago   Social Determinants of Radio broadcast assistant Strain: Low Risk    Difficulty of Paying Living Expenses: Not hard at all  Food Insecurity: No Food Insecurity   Worried About Charity fundraiser in the Last Year: Never true   Ran Out of Food in the Last Year: Never true  Transportation Needs: No Transportation Needs   Lack of Transportation (Medical): No   Lack of Transportation (Non-Medical): No  Physical Activity: Not on file  Stress: Not on file  Social Connections: Not on file    High Risk Criteria for Readmission and/or Poor Patient Outcomes: Heart failure hospital admissions (last 6 months): 1  No Show rate: 0% Difficult social situation: No Demonstrates medication adherence: yes Primary Language: English Literacy level: Able to read/write and comprehend. Wears glasses.  Barriers of Care:   -new HFrEF  Considerations/Referrals:   Referral made to Heart Failure Pharmacist Stewardship: yes, to see at [facility] Referral made to Heart Failure CSW/NCM TOC: no Referral made to Heart & Vascular TOC clinic: yes, 1/5 @ 9AM  Items for Follow-up on DC/TOC: -optimize -continue HF education/fluid modification   Pricilla Holm, MSN, RN Heart Failure Nurse Navigator [phone]

## 2021-10-26 NOTE — Progress Notes (Signed)
Patient given discharge instructions and stated understanding. 

## 2021-10-26 NOTE — Progress Notes (Signed)
Mobility Specialist Progress Note    10/26/21 1114  Mobility  Activity Ambulated in hall  Level of Assistance Contact guard assist, steadying assist  Assistive Device  (railings)  Distance Ambulated (ft) 490 ft  Mobility Ambulated with assistance in hallway  Mobility Response Tolerated fair  Mobility performed by Mobility specialist  $Mobility charge 1 Mobility   Pre-Mobility: 78 HR, 122/68 BP, 95% SpO2 Post-Mobility: 82 HR, 97% SpO2  Pt received in bed and agreeable. No complaints on walk. Did have LOB x1 but recovered with minA. Returned to bed with call bell in reach.  [facility] Mobility Specialist  M.S. Primary Phone: [phone] M.S. Secondary Phone: [phone]

## 2021-10-27 ENCOUNTER — Telehealth: Payer: Self-pay | Admitting: Medical

## 2021-10-27 MED ORDER — VENLAFAXINE HCL ER 37.5 MG PO CP24: 37.5000 mg | ORAL_CAPSULE | Freq: Every day | ORAL | 0 refills | Status: DC

## 2021-10-27 NOTE — Telephone Encounter (Signed)
° °  Notified by inpatient pharmacy of a medication issue with this patient who was discharged 10/26/21. The patient was discharged home on effexor 75 mg daily, though previously on 37.5mg  daily. Unclear why change in dosage, given medication was initially held for prolonged Qtc. She called the pharmacy team requesting a refill. Will send Rx for 1 week supply of 37.5mg  daily. She will be seen in the impact clinic 11/01/20 at which time an EKG can be obtained for close monitoring of Qtc. Recommend PCP manage prescription going forward. Called patient's daughter to relay this message.   Abigail Butts, PA-C 10/27/21; 12:21 PM

## 2021-11-01 ENCOUNTER — Encounter (HOSPITAL_COMMUNITY): Payer: Medicare Other

## 2021-11-05 ENCOUNTER — Telehealth (HOSPITAL_COMMUNITY): Payer: Self-pay

## 2021-11-05 ENCOUNTER — Ambulatory Visit (HOSPITAL_COMMUNITY)
Admission: RE | Admit: 2021-11-05 | Discharge: 2021-11-05 | Disposition: A | Discharge status: Home / Self Care | Payer: Medicare HMO | Source: Ambulatory Visit | Attending: Adult Health | Admitting: Adult Health

## 2021-11-05 ENCOUNTER — Other Ambulatory Visit: Payer: Self-pay

## 2021-11-05 ENCOUNTER — Encounter (HOSPITAL_COMMUNITY): Payer: Self-pay

## 2021-11-05 VITALS — BP 124/80 | HR 71 | Wt 160.0 lb

## 2021-11-05 DIAGNOSIS — K219 Gastro-esophageal reflux disease without esophagitis: Secondary | ICD-10-CM | POA: Insufficient documentation

## 2021-11-05 DIAGNOSIS — I480 Paroxysmal atrial fibrillation: Secondary | ICD-10-CM | POA: Insufficient documentation

## 2021-11-05 DIAGNOSIS — Z79899 Other long term (current) drug therapy: Secondary | ICD-10-CM | POA: Diagnosis not present

## 2021-11-05 DIAGNOSIS — R918 Other nonspecific abnormal finding of lung field: Secondary | ICD-10-CM | POA: Diagnosis not present

## 2021-11-05 DIAGNOSIS — I5022 Chronic systolic (congestive) heart failure: Secondary | ICD-10-CM | POA: Diagnosis not present

## 2021-11-05 DIAGNOSIS — F419 Anxiety disorder, unspecified: Secondary | ICD-10-CM | POA: Diagnosis not present

## 2021-11-05 DIAGNOSIS — R0602 Shortness of breath: Secondary | ICD-10-CM | POA: Diagnosis not present

## 2021-11-05 DIAGNOSIS — R69 Illness, unspecified: Secondary | ICD-10-CM | POA: Diagnosis not present

## 2021-11-05 DIAGNOSIS — Z7901 Long term (current) use of anticoagulants: Secondary | ICD-10-CM | POA: Diagnosis not present

## 2021-11-05 DIAGNOSIS — I110 Hypertensive heart disease with heart failure: Secondary | ICD-10-CM | POA: Diagnosis not present

## 2021-11-05 DIAGNOSIS — I2510 Atherosclerotic heart disease of native coronary artery without angina pectoris: Secondary | ICD-10-CM | POA: Insufficient documentation

## 2021-11-05 DIAGNOSIS — I428 Other cardiomyopathies: Secondary | ICD-10-CM | POA: Insufficient documentation

## 2021-11-05 LAB — BASIC METABOLIC PANEL
Anion gap: 10 (ref 5–15)
BUN: 16 mg/dL (ref 8–23)
CO2: 30 mmol/L (ref 22–32)
Calcium: 9.6 mg/dL (ref 8.9–10.3)
Chloride: 104 mmol/L (ref 98–111)
Creatinine, Ser: 1.01 mg/dL — ABNORMAL HIGH (ref 0.44–1.00)
GFR, Estimated: 56 mL/min — ABNORMAL LOW (ref >60)
Glucose, Bld: 84 mg/dL (ref 70–99)
Potassium: 3.4 mmol/L — ABNORMAL LOW (ref 3.5–5.1)
Sodium: 144 mmol/L (ref 135–145)

## 2021-11-05 LAB — CBC
HCT: 43.6 % (ref 36.0–46.0)
Hemoglobin: 14.2 g/dL (ref 12.0–15.0)
MCH: 29.5 pg (ref 26.0–34.0)
MCHC: 32.6 g/dL (ref 30.0–36.0)
MCV: 90.5 fL (ref 80.0–100.0)
Platelets: 239 10*3/uL (ref 150–400)
RBC: 4.82 MIL/uL (ref 3.87–5.11)
RDW: 13.3 % (ref 11.5–15.5)
WBC: 5.7 10*3/uL (ref 4.0–10.5)
nRBC: 0 % (ref 0.0–0.2)

## 2021-11-05 MED ORDER — FUROSEMIDE 20 MG PO TABS: 20.0000 mg | ORAL_TABLET | Freq: Every day | ORAL | 0 refills | Status: DC

## 2021-11-05 MED ORDER — RIVAROXABAN 20 MG PO TABS: 20.0000 mg | ORAL_TABLET | Freq: Every day | ORAL | 0 refills | Status: DC

## 2021-11-05 MED ORDER — SPIRONOLACTONE 25 MG PO TABS: 12.5000 mg | ORAL_TABLET | Freq: Every day | ORAL | 0 refills | Status: DC

## 2021-11-05 MED ORDER — ATORVASTATIN CALCIUM 20 MG PO TABS: 20.0000 mg | ORAL_TABLET | Freq: Every day | ORAL | 0 refills | Status: DC

## 2021-11-05 MED ORDER — METOPROLOL SUCCINATE ER 25 MG PO TB24: 12.5000 mg | ORAL_TABLET | Freq: Every day | ORAL | 0 refills | Status: DC

## 2021-11-05 NOTE — Patient Instructions (Signed)
START Spironolactone 12.5 mg ( 1/2 tablet) daily  Please weigh daily and record  Labs done today, your results will be available in MyChart, we will contact you for abnormal readings.  Follow up lab work in a week Prescription given that you can have labs done close to you)  You have been referred to Cardiology Dr. Bettina Gavia in [place]. His office will call to schedule an appointment  Do the following things EVERYDAY: Weigh yourself in the morning before breakfast. Write it down and keep it in a log. Take your medicines as prescribed Eat low salt foods--Limit salt (sodium) to 2000 mg per day.  Stay as active as you can everyday Limit all fluids for the day to less than 2 liters

## 2021-11-05 NOTE — Telephone Encounter (Signed)
Called to confirm Heart & Vascular Transitions of Care appointment at 3pm today 1/9. Patient reminded to bring all medications and pill box organizer with them. Confirmed patient has transportation. Gave directions, instructed to utilize [place] parking.  Confirmed appointment prior to ending call.   Pricilla Holm, MSN, RN Heart Failure Nurse Navigator [phone]

## 2021-11-05 NOTE — Progress Notes (Signed)
HEART & VASCULAR TRANSITION OF CARE CONSULT NOTE     Referring Physician: Dr Burt Knack  Primary Care: Dr Venetia Maxon  Primary Cardiologist: Dr Burt Knack   HPI: Referred to clinic by Dr Burt Knack  for heart failure consultation.   Ms Dust is a 81 year old with a history of leukemia (AML tx in the 1990s with bone marrow graft in [place] per pt), anxiety, depression, GERD, ?prior atrial fibrillation. Previously on eliquis but stopped due to vaginal bleeding.   Admitted 10/24/21 from [facility] with increased shortness of breath. Echo at [facility] EF 25-30%. CTA negative for PE. BNP 3670. Had cath with min CAD and preserved CO. Started IV amio with conversion to SR. Placed on Xarelto. GDMT limited by hypotension. Discharged on metoprolol and lasix.   Today she presents with daughter. Complaining of some fatigue. Mild SOB with activities in the house. Denies PND/Orthopnea. No falls. No bleeding issues. Appetite ok. No fever or chills. She  has not been weighing at home. She has a scale but battery needs to be replaced. Taking all medications but she was off effexor for 3 days due to pharmacy error at discharge.  Lives with her husband. Her daughter lives next door [place] See) . Her daughter drives her to appointments and request a Cardiologist in [place].    Cardiac Testing  Echo 10/23/21 EF 25-30% with severe global HK, mildly impared RV systolic function, moderate LAE, mildly enlarged RA, possible evidence of interatrial communication by color flow doppler, negative contrast study, mild AI, mild-moderate MR, mild TR, unable to estimate RVSP due to inadequate TR jet  RHC/LHC  Mid LAD lesion 40% stenosed  PCWP 13 PA 25/9 (16)  CO 7.4   Review of Systems: [y] = yes, [ ]  = no   General: Weight gain [ ] ; Weight loss [ ] ; Anorexia [ ] ; Fatigue [ Y]; Fever [ ] ; Chills [ ] ; Weakness [Y ]  Cardiac: Chest pain/pressure [ ] ; Resting SOB [ ] ; Exertional SOB [ Y]; Orthopnea [ ] ; Pedal Edema [ ] ;  Palpitations [ ] ; Syncope [ ] ; Presyncope [ ] ; Paroxysmal nocturnal dyspnea[ ]   Pulmonary: Cough [ ] ; Wheezing[ ] ; Hemoptysis[ ] ; Sputum [ ] ; Snoring [ ]   GI: Vomiting[ ] ; Dysphagia[ ] ; Melena[ ] ; Hematochezia [ ] ; Heartburn[ ] ; Abdominal pain [ ] ; Constipation [ ] ; Diarrhea [ ] ; BRBPR [ ]   GU: Hematuria[ ] ; Dysuria [ ] ; Nocturia[ ]   Vascular: Pain in legs with walking [ ] ; Pain in feet with lying flat [ ] ; Non-healing sores [ ] ; Stroke [ ] ; TIA [ ] ; Slurred speech [ ] ;  Neuro: Headaches[ ] ; Vertigo[ ] ; Seizures[ ] ; Paresthesias[ ] ;Blurred vision [ ] ; Diplopia [ ] ; Vision changes [ ]   Ortho/Skin: Arthritis [ ] ; Joint pain [ ] ; Muscle pain [ ] ; Joint swelling [ ] ; Back Pain [ Y]; Rash [ ]   Psych: Depression[ Y]; Anxiety[Y ]  Heme: Bleeding problems [ ] ; Clotting disorders [ ] ; Anemia [ ]   Endocrine: Diabetes [ ] ; Thyroid dysfunction[ ]    Past Medical History:  Diagnosis Date   AML (acute myelogenous leukemia) ([place])    Anxiety    Atrial fibrillation (HCC)    Barrett's esophagus    Basal cell carcinoma of vulva (HCC)    Depression    Endometrial polyp    "polyps" per intake sheet   GERD (gastroesophageal reflux disease)    Hemorrhoids    Hypertension    Spinal stenosis    Weakness  Current Outpatient Medications  Medication Sig Dispense Refill   atorvastatin (LIPITOR) 20 MG tablet Take 1 tablet (20 mg total) by mouth daily. 30 tablet 11   carboxymethylcellulose (REFRESH PLUS) 0.5 % SOLN Place 1 drop into both eyes daily as needed (dry eyes).     Cholecalciferol (VITAMIN D) 125 MCG (5000 UT) CAPS Take 5,000 Units by mouth daily.     diazepam (VALIUM) 10 MG tablet Take 10 mg by mouth 2 (two) times daily. Takes an additional tablet if needed in the afternoon     famotidine (PEPCID) 40 MG tablet Take 40 mg by mouth daily.     furosemide (LASIX) 20 MG tablet Take 1 tablet (20 mg total) by mouth daily. 30 tablet 6   metoprolol succinate (TOPROL-XL) 25 MG 24 hr tablet Take 0.5 tablets  (12.5 mg total) by mouth daily. 30 tablet 6   Multiple Vitamins-Minerals (PRESERVISION AREDS 2) CAPS Take 1 tablet by mouth daily.     pantoprazole (PROTONIX) 40 MG tablet Take 1 tablet (40 mg total) by mouth daily. 90 tablet 3   rivaroxaban (XARELTO) 20 MG TABS tablet Take 1 tablet (20 mg total) by mouth daily with supper. 30 tablet 11   venlafaxine XR (EFFEXOR XR) 37.5 MG 24 hr capsule Take 1 capsule (37.5 mg total) by mouth daily with breakfast. Please follow-up with PCP for further refills. 7 capsule 0   vitamin B-12 (CYANOCOBALAMIN) 1000 MCG tablet Take 1,000 mcg by mouth daily.     No current facility-administered medications for this encounter.    Allergies  Allergen Reactions   Compazine [Prochlorperazine Edisylate] Hives   Cortisone Other (See Comments)    unknown   Eliquis [Apixaban] Itching and Nausea Only    Pt does not recall itching/nausea as entered in chart in 2018 but states she had vaginal bleeding with this   Prednisone Nausea Only    Gives her a really bad headache   Prochlorperazine Other (See Comments)    unknown      Social History   Socioeconomic History   Marital status: Married    Spouse name: Preslee Regas   Number of children: 1   Years of education: Not on file   Highest education level: High school graduate  Occupational History   Occupation: retired  Tobacco Use   Smoking status: Never   Smokeless tobacco: Never  Vaping Use   Vaping Use: Never used  Substance and Sexual Activity   Alcohol use: No   Drug use: No   Sexual activity: Not on file  Other Topics Concern   Not on file  Social History Narrative   Lives at home with husband   Right handed   Caffeine: quit years ago   Social Determinants of Radio broadcast assistant Strain: Low Risk    Difficulty of Paying Living Expenses: Not hard at all  Food Insecurity: No Food Insecurity   Worried About Charity fundraiser in the Last Year: Never true   [place] in the Last Year:  Never true  Transportation Needs: No Transportation Needs   Lack of Transportation (Medical): No   Lack of Transportation (Non-Medical): No  Physical Activity: Not on file  Stress: Not on file  Social Connections: Not on file  Intimate Partner Violence: Not on file      Family History  Problem Relation Age of Onset   CVA Mother    Heart attack Mother    Heart disease Mother  Diabetes Mother        late in life   Lymphoma Father    Non-Hodgkin's lymphoma Father    Heart attack Brother    Heart disease Brother    Alzheimer's disease Brother    Breast cancer Paternal Aunt    Colon cancer Neg Hx     Vitals:   11/05/21 1505  BP: 124/80  Pulse: 71  SpO2: 93%  Weight: 72.6 kg (160 lb)   Wt Readings from Last 3 Encounters:  11/05/21 72.6 kg  10/26/21 78.1 kg  09/30/19 80.7 kg     PHYSICAL EXAM: General:  Walked in the clinic.  No respiratory difficulty HEENT: normal Neck: supple. no JVD. Carotids 2+ bilat; no bruits. No lymphadenopathy or thryomegaly appreciated. Cor: PMI nondisplaced. Regular rate & rhythm. No rubs, gallops or murmurs. Lungs: clear Abdomen: soft, nontender, nondistended. No hepatosplenomegaly. No bruits or masses. Good bowel sounds. Extremities: no cyanosis, clubbing, rash, edema Neuro: alert & oriented x 3, cranial nerves grossly intact. moves all 4 extremities w/o difficulty. Affect pleasant.  ECG: SR 71 bpm    ASSESSMENT & PLAN: Chronic HFrEF 09/2021 Echo Ef 25-30% . NICM. Suspect tachy-mediated with recent A Fib RVR. Cath with min CAD. Would plan to repeat ECHO in 3 months after HF meds optimized.  -NYHA II-III. Volume status stable. Continue lasix 20 mg daily. Check BMET today. May need to stop with renal function elevated.  -BB-Continue Toprol XL 12.5 mg daily -Arni- Hold off for now.  -MRA- Add 12.5 mg spiro at bed time. -SGLT2i- Hold for now. If SGLT2i started would stop lasix.  - Discussed daily weights.  2. PAF - Recently in A fib  RVR but chemically converted with amio.  -EKG today -->SR. -Continue current Toprol XL 12.5 mg daily  - Continue xarelto. Previous on eliquis but stopped due to vaginal bleeding.  - Check CBC today.   3. Lung Nodules  Has f/u with Pulmonary later this month.   Today she was given 30 day refill for Toprol XL, xarelto, lasix, and atorvastatin.  Check CBC, BMET today. She was given script for repeat BMET next week after starting spironolactone.   Discussed medication changes/EKG   Referred to HFSW (PCP, Medications, Transportation, ETOH Abuse, Drug Abuse, Insurance, Financial ): No Refer to Pharmacy:  No Refer to Home Health:  No Refer to Advanced Heart Failure Clinic: No  Refer to General Cardiology: Yes--> Her daughter request referral to Dr Bettina Gavia in [place].   Follow up as needed .  Darrick Grinder NP- C 4:34 PM

## 2021-11-07 ENCOUNTER — Telehealth (HOSPITAL_COMMUNITY): Payer: Self-pay | Admitting: Pharmacy Technician

## 2021-11-07 ENCOUNTER — Other Ambulatory Visit (HOSPITAL_COMMUNITY): Payer: Self-pay

## 2021-11-07 DIAGNOSIS — I25119 Atherosclerotic heart disease of native coronary artery with unspecified angina pectoris: Secondary | ICD-10-CM | POA: Diagnosis not present

## 2021-11-07 DIAGNOSIS — R9431 Abnormal electrocardiogram [ECG] [EKG]: Secondary | ICD-10-CM | POA: Diagnosis not present

## 2021-11-07 DIAGNOSIS — I428 Other cardiomyopathies: Secondary | ICD-10-CM | POA: Diagnosis not present

## 2021-11-07 DIAGNOSIS — I10 Essential (primary) hypertension: Secondary | ICD-10-CM | POA: Diagnosis not present

## 2021-11-07 DIAGNOSIS — R9389 Abnormal findings on diagnostic imaging of other specified body structures: Secondary | ICD-10-CM | POA: Diagnosis not present

## 2021-11-07 DIAGNOSIS — R69 Illness, unspecified: Secondary | ICD-10-CM | POA: Diagnosis not present

## 2021-11-07 DIAGNOSIS — D6869 Other thrombophilia: Secondary | ICD-10-CM | POA: Diagnosis not present

## 2021-11-07 DIAGNOSIS — I480 Paroxysmal atrial fibrillation: Secondary | ICD-10-CM | POA: Diagnosis not present

## 2021-11-07 DIAGNOSIS — I5020 Unspecified systolic (congestive) heart failure: Secondary | ICD-10-CM | POA: Diagnosis not present

## 2021-11-07 DIAGNOSIS — D692 Other nonthrombocytopenic purpura: Secondary | ICD-10-CM | POA: Diagnosis not present

## 2021-11-07 NOTE — Telephone Encounter (Signed)
Pharmacy Transitions of Care Follow-up Telephone Call  Date of discharge: 10/26/2021  Discharge Diagnosis: Afib   Medication changes made at discharge: STOP taking: ASHWAGANDHA PO  aspirin EC 81 MG tablet  escitalopram 20 MG tablet (LEXAPRO)  ibuprofen 200 MG tablet (ADVIL)   Medication changes verified by the patient?  Yes    Medication Accessibility:  Home Pharmacy: [facility], [place] and [facility] [address]  Was the patient provided with refills on discharged medications? Yes, but all have since been discontinued  Have all prescriptions been transferred from [facility] to home pharmacy? N/A   Is the patient able to afford medications? Yes, but concerned about cost when reaching the coverage gap.  Notable copays: Xarelto $47/30 days     Medication Review:  RIVAROXABAN (XARELTO)  Rivaroxaban 20 mg daily  - Discussed importance of taking medication with food and around the same time everyday  - Reviewed potential DDIs with patient  - Advised patient of medications to avoid (NSAIDs, ASA)  - Educated that Tylenol (acetaminophen) will be the preferred analgesic to prevent risk of bleeding  - Emphasized importance of monitoring for signs and symptoms of bleeding (abnormal bruising, prolonged bleeding, nose bleeds, bleeding from gums, discolored urine, black tarry stools)  - Advised patient to alert all providers of anticoagulation therapy prior to starting a new medication or having a procedure    Follow-up Appointments:  Hospital f/u appt confirmed?  Saw PCP on 11/07/2021.   [facility] f/u appt confirmed?  Scheduled to see Shirlee More on 12/12/2021 @3 :40 pm .   If their condition worsens, is the pt aware to call PCP or go to the Emergency Dept.? Yes  Final Patient Assessment:  -Pt is doing well. No SOB, CP, or palpitations. No s/sx of bleeding or bruising. -Pt verbalized understanding of Xarelto.  -Pt has post discharge  appointment.  Parthenia Ames, PharmD

## 2021-11-09 ENCOUNTER — Other Ambulatory Visit (HOSPITAL_COMMUNITY): Payer: Self-pay

## 2021-11-22 DIAGNOSIS — E785 Hyperlipidemia, unspecified: Secondary | ICD-10-CM | POA: Diagnosis not present

## 2021-11-22 DIAGNOSIS — Z888 Allergy status to other drugs, medicaments and biological substances status: Secondary | ICD-10-CM | POA: Diagnosis not present

## 2021-11-22 DIAGNOSIS — I4891 Unspecified atrial fibrillation: Secondary | ICD-10-CM | POA: Diagnosis not present

## 2021-11-22 DIAGNOSIS — K219 Gastro-esophageal reflux disease without esophagitis: Secondary | ICD-10-CM | POA: Diagnosis not present

## 2021-11-22 DIAGNOSIS — Z886 Allergy status to analgesic agent status: Secondary | ICD-10-CM | POA: Diagnosis not present

## 2021-11-22 DIAGNOSIS — R69 Illness, unspecified: Secondary | ICD-10-CM | POA: Diagnosis not present

## 2021-11-22 DIAGNOSIS — R030 Elevated blood-pressure reading, without diagnosis of hypertension: Secondary | ICD-10-CM | POA: Diagnosis not present

## 2021-11-22 DIAGNOSIS — C9591 Leukemia, unspecified, in remission: Secondary | ICD-10-CM | POA: Diagnosis not present

## 2021-11-22 DIAGNOSIS — Z809 Family history of malignant neoplasm, unspecified: Secondary | ICD-10-CM | POA: Diagnosis not present

## 2021-11-22 DIAGNOSIS — D6869 Other thrombophilia: Secondary | ICD-10-CM | POA: Diagnosis not present

## 2021-11-26 ENCOUNTER — Other Ambulatory Visit: Payer: Self-pay

## 2021-11-26 ENCOUNTER — Encounter: Payer: Self-pay | Admitting: Pulmonary Disease

## 2021-11-26 ENCOUNTER — Ambulatory Visit: Payer: Medicare HMO | Admitting: Pulmonary Disease

## 2021-11-26 ENCOUNTER — Inpatient Hospital Stay: Payer: Medicare Other | Admitting: Pulmonary Disease

## 2021-11-26 VITALS — BP 120/68 | HR 74 | Temp 97.7°F | Ht 68.0 in | Wt 162.4 lb

## 2021-11-26 DIAGNOSIS — R599 Enlarged lymph nodes, unspecified: Secondary | ICD-10-CM | POA: Diagnosis not present

## 2021-11-26 DIAGNOSIS — R918 Other nonspecific abnormal finding of lung field: Secondary | ICD-10-CM | POA: Diagnosis not present

## 2021-11-26 DIAGNOSIS — Z789 Other specified health status: Secondary | ICD-10-CM | POA: Diagnosis not present

## 2021-11-26 DIAGNOSIS — J90 Pleural effusion, not elsewhere classified: Secondary | ICD-10-CM | POA: Diagnosis not present

## 2021-11-26 NOTE — Patient Instructions (Addendum)
Thank you for visiting Dr. Valeta Harms at [facility]. Today we recommend the following:  Orders Placed This Encounter  Procedures   CT Chest Wo Contrast   Repeat CT Chest in February 2023  Return if symptoms worsen or fail to improve.    Please do your part to reduce the spread of COVID-19.

## 2021-11-26 NOTE — Progress Notes (Signed)
Synopsis: Referred in January 2023 for hospital follow-up by Street, [name], *  Subjective:   PATIENT ID: [name] GENDER: female DOB: [birth year], MRN: [id number]  Chief Complaint  Patient presents with   Follow-up    Patient says she's been doing good. Patient has no complaints.    This is an 81 year old female, past medical history of AML, atrial fibrillation, hypertension, gastroesophageal reflux.Patient was recently admitted to the hospital on 10/24/2021.  Remote history of leukemia AML treated in the 1990s with a bone marrow transplant in [place], anxiety depression GERD.  Seen for evaluation of atrial fibrillation and new onset cardiomyopathy as a transfer from [facility] to the cardiology service.  Patient had CT scan of the chest with no evidence of PE but it showed numerous pulmonary nodules with mediastinal and bilateral hilar adenopathy.  At the time currently treated with aspirin carvedilol Entresto Lasix.  Found to have an ejection fraction of 25 to 30% with severe global hypokinesis she had a heart catheterization which showed diffuse nonobstructive coronary disease 40% in the mid LAD.  She had a wedge pressure of 13, PA pressure of 25/9 with a mean of 16 normal cardiac output.  Patient was seen in consultation by pulmonary during her hospitalization on 10/25/2021.Cons note reviewed which revealed bilateral pleural effusions they felt did not have a significant pocket for fluid drainage with thoracentesis.  Pulmonary nodules with adenopathy.  Felt to be benign as patient is non-smoker and relatively nonspecific but recommended CT follow-up with outpatient clinic.  Here today to establish care with plans for outpatient follow-up.   Oncology History   No history exists.    Past Medical History:  Diagnosis Date   AML (acute myelogenous leukemia) ([place])    Anxiety    Atrial fibrillation (HCC)    Barrett's esophagus    Basal cell carcinoma of vulva (HCC)     Depression    Endometrial polyp    "polyps" per intake sheet   GERD (gastroesophageal reflux disease)    Hemorrhoids    Hypertension    Spinal stenosis    Weakness      Family History  Problem Relation Age of Onset   CVA Mother    Heart attack Mother    Heart disease Mother    Diabetes Mother        late in life   Lymphoma Father    Non-Hodgkin's lymphoma Father    Heart attack Brother    Heart disease Brother    Alzheimer's disease Brother    Breast cancer Paternal Aunt    Colon cancer Neg Hx      Past Surgical History:  Procedure Laterality Date   CATARACT EXTRACTION, BILATERAL     august/september 2017   cataract surgery     CERVICAL POLYPECTOMY N/A 08/14/2015   Procedure: CERVICAL POLYPECTOMY;  Surgeon: Newton Pigg, MD;  Location: [facility];  Service: Gynecology;  Laterality: N/A;   CHOLECYSTECTOMY     endometrial polyp surgery     HEMORROIDECTOMY     HYSTEROSCOPY WITH D & C N/A 08/14/2015   Procedure: DILATATION AND CURETTAGE /HYSTEROSCOPY;  Surgeon: Newton Pigg, MD;  Location: [facility];  Service: Gynecology;  Laterality: N/A;   KNEE SURGERY Right    PORTACATH PLACEMENT     RIGHT/LEFT HEART CATH AND CORONARY ANGIOGRAPHY N/A 10/24/2021   Procedure: RIGHT/LEFT HEART CATH AND CORONARY ANGIOGRAPHY;  Surgeon: Sherren Mocha, MD;  Location: [facility];  Service: Cardiovascular;  Laterality:  N/A;   TUNNELED VENOUS CATHETER PLACEMENT      Social History   Socioeconomic History   Marital status: Married    Spouse name: Rebeckah Masih   Number of children: 1   Years of education: Not on file   Highest education level: High school graduate  Occupational History   Occupation: retired  Tobacco Use   Smoking status: Never   Smokeless tobacco: Never  Vaping Use   Vaping Use: Never used  Substance and Sexual Activity   Alcohol use: No   Drug use: No   Sexual activity: Not on file  Other Topics Concern   Not on file  Social History Narrative   Lives at  home with husband   Right handed   Caffeine: quit years ago   Social Determinants of Radio broadcast assistant Strain: Low Risk    Difficulty of Paying Living Expenses: Not hard at all  Food Insecurity: No Food Insecurity   Worried About Charity fundraiser in the Last Year: Never true   Arboriculturist in the Last Year: Never true  Transportation Needs: No Transportation Needs   Lack of Transportation (Medical): No   Lack of Transportation (Non-Medical): No  Physical Activity: Not on file  Stress: Not on file  Social Connections: Not on file  Intimate Partner Violence: Not on file     Allergies  Allergen Reactions   Compazine [Prochlorperazine Edisylate] Hives   Cortisone Other (See Comments)    unknown   Eliquis [Apixaban] Itching and Nausea Only    Pt does not recall itching/nausea as entered in chart in 2018 but states she had vaginal bleeding with this   Prednisone Nausea Only    Gives her a really bad headache   Prochlorperazine Other (See Comments)    unknown     Outpatient Medications Prior to Visit  Medication Sig Dispense Refill   atorvastatin (LIPITOR) 20 MG tablet Take 1 tablet (20 mg total) by mouth daily. 30 tablet 0   Cholecalciferol (VITAMIN D) 125 MCG (5000 UT) CAPS Take 5,000 Units by mouth daily.     diazepam (VALIUM) 10 MG tablet Take 10 mg by mouth 2 (two) times daily. Takes an additional tablet if needed in the afternoon     famotidine (PEPCID) 40 MG tablet Take 40 mg by mouth daily.     furosemide (LASIX) 20 MG tablet Take 1 tablet (20 mg total) by mouth daily. 30 tablet 0   metoprolol succinate (TOPROL-XL) 25 MG 24 hr tablet Take 0.5 tablets (12.5 mg total) by mouth daily. 30 tablet 0   Multiple Vitamins-Minerals (PRESERVISION AREDS 2) CAPS Take 1 tablet by mouth daily.     pantoprazole (PROTONIX) 40 MG tablet Take 1 tablet (40 mg total) by mouth daily. 90 tablet 3   rivaroxaban (XARELTO) 20 MG TABS tablet Take 1 tablet (20 mg total) by mouth daily  with supper. 30 tablet 0   spironolactone (ALDACTONE) 25 MG tablet Take 0.5 tablets (12.5 mg total) by mouth daily. 30 tablet 0   venlafaxine XR (EFFEXOR XR) 37.5 MG 24 hr capsule Take 1 capsule (37.5 mg total) by mouth daily with breakfast. Please follow-up with PCP for further refills. 7 capsule 0   vitamin B-12 (CYANOCOBALAMIN) 1000 MCG tablet Take 1,000 mcg by mouth daily.     carboxymethylcellulose (REFRESH PLUS) 0.5 % SOLN Place 1 drop into both eyes daily as needed (dry eyes). (Patient not taking: Reported on 11/26/2021)  No facility-administered medications prior to visit.    Review of Systems  Constitutional:  Negative for chills, fever, malaise/fatigue and weight loss.  HENT:  Negative for hearing loss, sore throat and tinnitus.   Eyes:  Negative for blurred vision and double vision.  Respiratory:  Positive for shortness of breath. Negative for cough, hemoptysis, sputum production, wheezing and stridor.   Cardiovascular:  Negative for chest pain, palpitations, orthopnea, leg swelling and PND.  Gastrointestinal:  Negative for abdominal pain, constipation, diarrhea, heartburn, nausea and vomiting.  Genitourinary:  Negative for dysuria, hematuria and urgency.  Musculoskeletal:  Negative for joint pain and myalgias.  Skin:  Negative for itching and rash.  Neurological:  Negative for dizziness, tingling, weakness and headaches.  Endo/Heme/Allergies:  Negative for environmental allergies. Does not bruise/bleed easily.  Psychiatric/Behavioral:  Negative for depression. The patient is not nervous/anxious and does not have insomnia.   All other systems reviewed and are negative.   Objective:  Physical Exam Vitals reviewed.  Constitutional:      General: She is not in acute distress.    Appearance: She is well-developed.  HENT:     Head: Normocephalic and atraumatic.  Eyes:     General: No scleral icterus.    Conjunctiva/sclera: Conjunctivae normal.     Pupils: Pupils are equal,  round, and reactive to light.  Neck:     Vascular: No JVD.     Trachea: No tracheal deviation.  Cardiovascular:     Rate and Rhythm: Normal rate and regular rhythm.     Heart sounds: Normal heart sounds. No murmur heard. Pulmonary:     Effort: Pulmonary effort is normal. No tachypnea, accessory muscle usage or respiratory distress.     Breath sounds: No stridor. No wheezing, rhonchi or rales.  Abdominal:     General: There is no distension.     Palpations: Abdomen is soft.     Tenderness: There is no abdominal tenderness.  Musculoskeletal:        General: No tenderness.     Cervical back: Neck supple.  Lymphadenopathy:     Cervical: No cervical adenopathy.  Skin:    General: Skin is warm and dry.     Capillary Refill: Capillary refill takes less than 2 seconds.     Findings: No rash.  Neurological:     Mental Status: She is alert and oriented to person, place, and time.  Psychiatric:        Behavior: Behavior normal.     Vitals:   11/26/21 1522  BP: 120/68  Pulse: 74  Temp: 97.7 F (36.5 C)  TempSrc: Oral  SpO2: 96%  Weight: 162 lb 6.4 oz (73.7 kg)  Height: 5\' 8"  (1.727 m)   96% on RA BMI Readings from Last 3 Encounters:  11/26/21 24.69 kg/m  11/05/21 25.06 kg/m  10/26/21 26.97 kg/m   Wt Readings from Last 3 Encounters:  11/26/21 162 lb 6.4 oz (73.7 kg)  11/05/21 160 lb (72.6 kg)  10/26/21 172 lb 2.9 oz (78.1 kg)     CBC    Component Value Date/Time   WBC 5.7 11/05/2021 1558   RBC 4.82 11/05/2021 1558   HGB 14.2 11/05/2021 1558   HCT 43.6 11/05/2021 1558   PLT 239 11/05/2021 1558   MCV 90.5 11/05/2021 1558   MCH 29.5 11/05/2021 1558   MCHC 32.6 11/05/2021 1558   RDW 13.3 11/05/2021 1558   LYMPHSABS 2.2 08/10/2015 1035   MONOABS 0.7 08/10/2015 1035   EOSABS 0.5 08/10/2015  1035   BASOSABS 0.1 08/10/2015 1035     Chest Imaging: Chest x-ray 10/25/2021: Bilateral pulmonary edema with pleural effusion. The patient's images have been  independently reviewed by me.    Pulmonary Functions Testing Results: No flowsheet data found.  FeNO:   Pathology:   Echocardiogram:   Heart Catheterization:     Assessment & Plan:     ICD-10-CM   1. Adenopathy  R59.9 CT Chest Wo Contrast    2. Bilateral pleural effusion  J90     3. Multiple pulmonary nodules  R91.8     4. Nonsmoker  Z78.9       Discussion:  This is an 81 year old female recent hospitalization transfer from [place] to [facility] for evaluation by cardiology.  Heart catheterization with nonobstructive coronary disease.  CT that was completed at [facility] demonstrated some mildly enlarged adenopathy within the chest also has interlobular septal thickening on CT areas of groundglass and bilateral pleural effusions it looks like acute heart failure on CT.  Plan: Patient was admitted to the hospital underwent diuresis. Now on a heart failure regimen Taking her medications appropriately. No longer has lower extremity edema. Atrial fibrillation followed by cardiology and currently on anticoagulation. She needs a repeat noncontrasted CT scan of the chest to make sure the lymph nodes look stable and/or disappear. It is common to find adenopathy in the chest in the setting of volume overload. I explained that we would let her know what her CT scan looks like. If her CT looks stable I do not think she will need any additional follow-up with [place]. She would like to have her CT completed at [facility] of some point. Will have it completed at the end of February if possible.    Current Outpatient Medications:    atorvastatin (LIPITOR) 20 MG tablet, Take 1 tablet (20 mg total) by mouth daily., Disp: 30 tablet, Rfl: 0   Cholecalciferol (VITAMIN D) 125 MCG (5000 UT) CAPS, Take 5,000 Units by mouth daily., Disp: , Rfl:    diazepam (VALIUM) 10 MG tablet, Take 10 mg by mouth 2 (two) times daily. Takes an additional tablet if needed in the afternoon, Disp: , Rfl:     famotidine (PEPCID) 40 MG tablet, Take 40 mg by mouth daily., Disp: , Rfl:    furosemide (LASIX) 20 MG tablet, Take 1 tablet (20 mg total) by mouth daily., Disp: 30 tablet, Rfl: 0   metoprolol succinate (TOPROL-XL) 25 MG 24 hr tablet, Take 0.5 tablets (12.5 mg total) by mouth daily., Disp: 30 tablet, Rfl: 0   Multiple Vitamins-Minerals (PRESERVISION AREDS 2) CAPS, Take 1 tablet by mouth daily., Disp: , Rfl:    pantoprazole (PROTONIX) 40 MG tablet, Take 1 tablet (40 mg total) by mouth daily., Disp: 90 tablet, Rfl: 3   rivaroxaban (XARELTO) 20 MG TABS tablet, Take 1 tablet (20 mg total) by mouth daily with supper., Disp: 30 tablet, Rfl: 0   spironolactone (ALDACTONE) 25 MG tablet, Take 0.5 tablets (12.5 mg total) by mouth daily., Disp: 30 tablet, Rfl: 0   venlafaxine XR (EFFEXOR XR) 37.5 MG 24 hr capsule, Take 1 capsule (37.5 mg total) by mouth daily with breakfast. Please follow-up with PCP for further refills., Disp: 7 capsule, Rfl: 0   vitamin B-12 (CYANOCOBALAMIN) 1000 MCG tablet, Take 1,000 mcg by mouth daily., Disp: , Rfl:    carboxymethylcellulose (REFRESH PLUS) 0.5 % SOLN, Place 1 drop into both eyes daily as needed (dry eyes). (Patient not taking: Reported on 11/26/2021),  Disp: , Rfl:     Garner Nash, DO [facility] 11/26/2021 3:57 PM

## 2021-11-27 DIAGNOSIS — I5022 Chronic systolic (congestive) heart failure: Secondary | ICD-10-CM | POA: Diagnosis not present

## 2021-11-28 ENCOUNTER — Telehealth: Payer: Self-pay | Admitting: Pulmonary Disease

## 2021-11-28 DIAGNOSIS — C4491 Basal cell carcinoma of skin, unspecified: Secondary | ICD-10-CM | POA: Diagnosis not present

## 2021-11-28 DIAGNOSIS — L900 Lichen sclerosus et atrophicus: Secondary | ICD-10-CM | POA: Diagnosis not present

## 2021-11-28 NOTE — Telephone Encounter (Signed)
LMTCB for [name] on work phone since not 3 pm yet.

## 2021-11-29 NOTE — Telephone Encounter (Signed)
Called and spoke to [place]. She questioned if pt's CT chest scheduled for 2/3 is correct. Per Dr. Juline Patch notes he wanted pt to have CT scan for Feb 2023. Advised [name] the scheduled CT date is correct. She verbalized understanding and denied any further questions or concerns at this time.

## 2021-11-30 DIAGNOSIS — R599 Enlarged lymph nodes, unspecified: Secondary | ICD-10-CM | POA: Diagnosis not present

## 2021-11-30 DIAGNOSIS — I2510 Atherosclerotic heart disease of native coronary artery without angina pectoris: Secondary | ICD-10-CM | POA: Diagnosis not present

## 2021-11-30 DIAGNOSIS — I700 Atherosclerosis of aorta: Secondary | ICD-10-CM | POA: Diagnosis not present

## 2021-12-11 DIAGNOSIS — F32A Depression, unspecified: Secondary | ICD-10-CM | POA: Insufficient documentation

## 2021-12-11 DIAGNOSIS — F419 Anxiety disorder, unspecified: Secondary | ICD-10-CM | POA: Insufficient documentation

## 2021-12-11 DIAGNOSIS — R531 Weakness: Secondary | ICD-10-CM | POA: Insufficient documentation

## 2021-12-11 DIAGNOSIS — C9200 Acute myeloblastic leukemia, not having achieved remission: Secondary | ICD-10-CM | POA: Insufficient documentation

## 2021-12-11 DIAGNOSIS — K649 Unspecified hemorrhoids: Secondary | ICD-10-CM | POA: Insufficient documentation

## 2021-12-11 DIAGNOSIS — C519 Malignant neoplasm of vulva, unspecified: Secondary | ICD-10-CM | POA: Insufficient documentation

## 2021-12-11 DIAGNOSIS — N840 Polyp of corpus uteri: Secondary | ICD-10-CM | POA: Insufficient documentation

## 2021-12-11 DIAGNOSIS — K2270 Barrett's esophagus without dysplasia: Secondary | ICD-10-CM | POA: Insufficient documentation

## 2021-12-11 DIAGNOSIS — K219 Gastro-esophageal reflux disease without esophagitis: Secondary | ICD-10-CM | POA: Insufficient documentation

## 2021-12-11 NOTE — Progress Notes (Unsigned)
Cardiology Office Note:    Date:  12/11/2021   ID:  [name], DOB Mar 17, 1941, MRN [id number]  PCP:  Street, [name], MD  Cardiologist:  Shirlee More, MD   Referring MD: Conrad [place], NP  ASSESSMENT:    No diagnosis found. PLAN:    In order of problems listed above:  ***  Next appointment   Medication Adjustments/Labs and Tests Ordered: Current medicines are reviewed at length with the patient today.  Concerns regarding medicines are outlined above.  No orders of the defined types were placed in this encounter.  No orders of the defined types were placed in this encounter.    No chief complaint on file. ***  History of Present Illness:    [name] is a 81 y.o. female who is being seen today for the evaluation of heart failure at the request of [name], [name], NP.  She has a history of leukemia questionable prior atrial fibrillation previously on anticoagulants. She was admitted 10/24/21 from [facility] with increased shortness of breath. Echo at [facility] EF 25-30%. CTA negative for PE. BNP 3670. Had cath with min CAD and preserved CO. Started IV amio with conversion to SR. Placed on Xarelto. GDMT limited by hypotension. Discharged on metoprolol and lasix.   She was seen in the heart failure clinic in [facility] 11/05/2021 for tachycardia induced cardiomyopathy continue on low-dose loop diuretic beta-blocker MRA SGLT2 inhibitor and her [name] was held due to symptomatic hypotension.  She is maintaining sinus rhythm on amiodarone and continue with anticoagulation. Past Medical History:  Diagnosis Date   AML (acute myelogenous leukemia) ([place])    Anxiety    Atrial fibrillation (HCC)    Barrett's esophagus    Basal cell carcinoma of vulva (HCC)    Depression    Endometrial polyp    "polyps" per intake sheet   GERD (gastroesophageal reflux disease)    Hemorrhoids    Hypertension    Spinal stenosis    Weakness     Past Surgical History:   Procedure Laterality Date   CATARACT EXTRACTION, BILATERAL     august/september 2017   cataract surgery     CERVICAL POLYPECTOMY N/A 08/14/2015   Procedure: CERVICAL POLYPECTOMY;  Surgeon: Newton Pigg, MD;  Location: [facility];  Service: Gynecology;  Laterality: N/A;   CHOLECYSTECTOMY     endometrial polyp surgery     HEMORROIDECTOMY     HYSTEROSCOPY WITH Snyder & C N/A 08/14/2015   Procedure: DILATATION AND CURETTAGE /HYSTEROSCOPY;  Surgeon: Newton Pigg, MD;  Location: [facility];  Service: Gynecology;  Laterality: N/A;   KNEE SURGERY Right    PORTACATH PLACEMENT     RIGHT/LEFT HEART CATH AND CORONARY ANGIOGRAPHY N/A 10/24/2021   Procedure: RIGHT/LEFT HEART CATH AND CORONARY ANGIOGRAPHY;  Surgeon: Sherren Mocha, MD;  Location: [place] [facility];  Service: Cardiovascular;  Laterality: N/A;   TUNNELED VENOUS CATHETER PLACEMENT      Current Medications: No outpatient medications have been marked as taking for the 12/12/21 encounter (Appointment) with Richardo Priest, MD.     Allergies:   Compazine [prochlorperazine edisylate], Cortisone, Eliquis [apixaban], Prednisone, and Prochlorperazine   Social History   Socioeconomic History   Marital status: Married    Spouse name: Ellissa Ayo   Number of children: 1   Years of education: Not on file   Highest education level: High school graduate  Occupational History   Occupation: retired  Tobacco Use   Smoking status: Never  Smokeless tobacco: Never  Vaping Use   Vaping Use: Never used  Substance and Sexual Activity   Alcohol use: No   Drug use: No   Sexual activity: Not on file  Other Topics Concern   Not on file  Social History Narrative   Lives at home with husband   Right handed   Caffeine: quit years ago   Social Determinants of Radio broadcast assistant Strain: Low Risk    Difficulty of Paying Living Expenses: Not hard at all  Food Insecurity: No Food Insecurity   Worried About Charity fundraiser in the Last Year:  Never true   [place] in the Last Year: Never true  Transportation Needs: No Transportation Needs   Lack of Transportation (Medical): No   Lack of Transportation (Non-Medical): No  Physical Activity: Not on file  Stress: Not on file  Social Connections: Not on file     Family History: The patient's ***family history includes Alzheimer's disease in her brother; Breast cancer in her paternal aunt; CVA in her mother; Diabetes in her mother; Heart attack in her brother and mother; Heart disease in her brother and mother; Lymphoma in her father; Non-Hodgkin's lymphoma in her father. There is no history of Colon cancer.  ROS:   ROS Please see the history of present illness.    *** All other systems reviewed and are negative.  EKGs/Labs/Other Studies Reviewed:    The following studies were reviewed today: ***  EKG:  EKG is *** ordered today.  The ekg ordered today is personally reviewed and demonstrates ***  Recent Labs: 10/25/2021: B Natriuretic Peptide 181.3; Magnesium 2.0; TSH 0.927 11/05/2021: BUN 16; Creatinine, Ser 1.01; Hemoglobin 14.2; Platelets 239; Potassium 3.4; Sodium 144  Recent Lipid Panel No results found for: CHOL, TRIG, HDL, CHOLHDL, VLDL, LDLCALC, LDLDIRECT  Physical Exam:    VS:  There were no vitals taken for this visit.    Wt Readings from Last 3 Encounters:  11/26/21 162 lb 6.4 oz (73.7 kg)  11/05/21 160 lb (72.6 kg)  10/26/21 172 lb 2.9 oz (78.1 kg)     GEN: *** Well nourished, well developed in no acute distress HEENT: Normal NECK: No JVD; No carotid bruits LYMPHATICS: No lymphadenopathy CARDIAC: ***RRR, no murmurs, rubs, gallops RESPIRATORY:  Clear to auscultation without rales, wheezing or rhonchi  ABDOMEN: Soft, non-tender, non-distended MUSCULOSKELETAL:  No edema; No deformity  SKIN: Warm and dry NEUROLOGIC:  Alert and oriented x 3 PSYCHIATRIC:  Normal affect     Signed, Shirlee More, MD  12/11/2021 12:02 PM    [place]

## 2021-12-12 ENCOUNTER — Ambulatory Visit: Payer: Medicare HMO | Admitting: Cardiology

## 2021-12-14 ENCOUNTER — Encounter: Payer: Self-pay | Admitting: Cardiology

## 2021-12-14 ENCOUNTER — Other Ambulatory Visit: Payer: Self-pay

## 2021-12-14 ENCOUNTER — Ambulatory Visit: Payer: Medicare HMO | Admitting: Cardiology

## 2021-12-14 VITALS — BP 120/76 | HR 74 | Ht 68.0 in | Wt 164.0 lb

## 2021-12-14 DIAGNOSIS — Z7901 Long term (current) use of anticoagulants: Secondary | ICD-10-CM | POA: Diagnosis not present

## 2021-12-14 DIAGNOSIS — I2510 Atherosclerotic heart disease of native coronary artery without angina pectoris: Secondary | ICD-10-CM

## 2021-12-14 DIAGNOSIS — I5022 Chronic systolic (congestive) heart failure: Secondary | ICD-10-CM | POA: Diagnosis not present

## 2021-12-14 DIAGNOSIS — I480 Paroxysmal atrial fibrillation: Secondary | ICD-10-CM | POA: Diagnosis not present

## 2021-12-14 DIAGNOSIS — Z79899 Other long term (current) drug therapy: Secondary | ICD-10-CM | POA: Diagnosis not present

## 2021-12-14 NOTE — Progress Notes (Signed)
Cardiology Office Note:    Date:  12/14/2021   ID:  [name], [name] [birth year], MRN [id number]  PCP:  Street, [name], MD  Cardiologist:  Shirlee More, MD   Referring MD: Conrad Augusta, NP  ASSESSMENT:    1. Chronic systolic heart failure ([place])   2. Mild CAD   3. PAF (paroxysmal atrial fibrillation) ([place])   4. On amiodarone therapy   5. Chronic anticoagulation    PLAN:    In order of problems listed above:  Overall she is doing much better New York Heart Association class I she has no fluid overload continue her current loop diuretic beta-blocker and MRA.  I will plan to see her back in my office after an echocardiogram at the beginning of April Stable CAD we will address lipid-lowering next visit Stable no recurrent clinical atrial fibrillation advised to purchase a smart watch to track and set atrial fibrillation detection She is not on antiarrhythmic therapy Continue her anticoagulant  Next appointment April after her echocardiogram   Medication Adjustments/Labs and Tests Ordered: Current medicines are reviewed at length with the patient today.  Concerns regarding medicines are outlined above.  Orders Placed This Encounter  Procedures   ECHOCARDIOGRAM COMPLETE   No orders of the defined types were placed in this encounter.    Chief Complaint  Patient presents with   Follow-up   Atrial Fibrillation   Anticoagulation   Congestive Heart Failure    History of Present Illness:    [name] is a 81 y.o. female who is being seen today for the evaluation of heart failure at the request of [name], [name], NP.   She has a history of leukemia questionable prior atrial fibrillation previously on anticoagulants. She was admitted 10/24/21 from [facility] with increased shortness of breath. Echo at [facility] EF 25-30%. CTA negative for PE. BNP 3670. Had cath with min CAD and preserved CO. Started IV amio with conversion to SR. Placed on Xarelto. GDMT limited by  hypotension. Discharged on metoprolol and lasix.    She was seen in the heart failure clinic in [facility] 11/05/2021 for tachycardia induced cardiomyopathy continue on low-dose loop diuretic beta-blocker MRA SGLT2 inhibitor and her Delene Loll was held due to symptomatic hypotension.  She is maintaining sinus rhythm and continue with anticoagulation.  Her daughter is present and works at [employer] and participates in her mother's care She is doing much better no lightheadedness checking blood pressure heart rate at home her weight is stable no edema shortness of breath chest pain or syncope. Her statin was stopped by her PCP with difficulty remembering beta-blocker restart lipid-lowering treatment at this time She takes her diuretic and said no bleeding complication Her renal function at the end of January 11/27/2021 showed a creatinine 1.0 potassium 4.2 Past Medical History:  Diagnosis Date   AML (acute myelogenous leukemia) ([place])    Anxiety    Atrial fibrillation ([place])    Barrett's esophagus    Basal cell carcinoma of vulva (HCC)    Depression    Endometrial polyp    "polyps" per intake sheet   GERD (gastroesophageal reflux disease)    Hemorrhoids    Hypertension    Spinal stenosis    Weakness     Past Surgical History:  Procedure Laterality Date   CATARACT EXTRACTION, BILATERAL     august/september 2017   cataract surgery     CERVICAL POLYPECTOMY N/A 08/14/2015   Procedure: CERVICAL POLYPECTOMY;  Surgeon: Marcello Moores  Ulanda Edison, MD;  Location: [facility];  Service: Gynecology;  Laterality: N/A;   CHOLECYSTECTOMY     endometrial polyp surgery     HEMORROIDECTOMY     HYSTEROSCOPY WITH D & C N/A 08/14/2015   Procedure: DILATATION AND CURETTAGE /HYSTEROSCOPY;  Surgeon: Newton Pigg, MD;  Location: [facility];  Service: Gynecology;  Laterality: N/A;   KNEE SURGERY Right    PORTACATH PLACEMENT     RIGHT/LEFT HEART CATH AND CORONARY ANGIOGRAPHY N/A 10/24/2021   Procedure: RIGHT/LEFT HEART  CATH AND CORONARY ANGIOGRAPHY;  Surgeon: Sherren Mocha, MD;  Location: [place] [facility];  Service: Cardiovascular;  Laterality: N/A;   TUNNELED VENOUS CATHETER PLACEMENT      Current Medications: Current Meds  Medication Sig   Cholecalciferol (VITAMIN D) 125 MCG (5000 UT) CAPS Take 5,000 Units by mouth daily.   diazepam (VALIUM) 10 MG tablet Take 10 mg by mouth 2 (two) times daily. Takes an additional tablet if needed in the afternoon   famotidine (PEPCID) 40 MG tablet Take 40 mg by mouth daily.   furosemide (LASIX) 20 MG tablet Take 1 tablet (20 mg total) by mouth daily.   metoprolol succinate (TOPROL-XL) 25 MG 24 hr tablet Take 0.5 tablets (12.5 mg total) by mouth daily.   Multiple Vitamins-Minerals (PRESERVISION AREDS 2) CAPS Take 1 tablet by mouth daily.   pantoprazole (PROTONIX) 40 MG tablet Take 1 tablet (40 mg total) by mouth daily.   rivaroxaban (XARELTO) 20 MG TABS tablet Take 1 tablet (20 mg total) by mouth daily with supper.   spironolactone (ALDACTONE) 25 MG tablet Take 0.5 tablets (12.5 mg total) by mouth daily.   venlafaxine XR (EFFEXOR XR) 37.5 MG 24 hr capsule Take 1 capsule (37.5 mg total) by mouth daily with breakfast. Please follow-up with PCP for further refills.   vitamin B-12 (CYANOCOBALAMIN) 1000 MCG tablet Take 1,000 mcg by mouth daily.     Allergies:   Compazine [prochlorperazine edisylate], Cortisone, Eliquis [apixaban], Prednisone, and Prochlorperazine   Social History   Socioeconomic History   Marital status: Married    Spouse name: [name]   Number of children: 1   Years of education: Not on file   Highest education level: High school graduate  Occupational History   Occupation: retired  Tobacco Use   Smoking status: Never    Passive exposure: Never   Smokeless tobacco: Never  Vaping Use   Vaping Use: Never used  Substance and Sexual Activity   Alcohol use: No   Drug use: No   Sexual activity: Not on file  Other Topics Concern   Not on  file  Social History Narrative   Lives at home with husband   Right handed   Caffeine: quit years ago   Social Determinants of Radio broadcast assistant Strain: Low Risk    Difficulty of Paying Living Expenses: Not hard at all  Food Insecurity: No Food Insecurity   Worried About Charity fundraiser in the Last Year: Never true   [place] in the Last Year: Never true  Transportation Needs: No Transportation Needs   Lack of Transportation (Medical): No   Lack of Transportation (Non-Medical): No  Physical Activity: Not on file  Stress: Not on file  Social Connections: Not on file     Family History: The patient's family history includes Alzheimer's disease in her brother; Breast cancer in her paternal aunt; CVA in her mother; Diabetes in her mother; Heart attack in her brother and  mother; Heart disease in her brother and mother; Lymphoma in her father; Non-Hodgkin's lymphoma in her father. There is no history of Colon cancer.  ROS:   ROS Please see the history of present illness.     All other systems reviewed and are negative.  EKGs/Labs/Other Studies Reviewed:    The following studies were reviewed today:   Recent Labs: 10/25/2021: B Natriuretic Peptide 181.3; Magnesium 2.0; TSH 0.927 11/05/2021: BUN 16; Creatinine, Ser 1.01; Hemoglobin 14.2; Platelets 239; Potassium 3.4; Sodium 144  Recent Lipid Panel No results found for: CHOL, TRIG, HDL, CHOLHDL, VLDL, LDLCALC, LDLDIRECT  Physical Exam:    VS:  There were no vitals taken for this visit.    Wt Readings from Last 3 Encounters:  11/26/21 162 lb 6.4 oz (73.7 kg)  11/05/21 160 lb (72.6 kg)  10/26/21 172 lb 2.9 oz (78.1 kg)     GEN:  Well nourished, well developed in no acute distress HEENT: Normal NECK: No JVD; No carotid bruits LYMPHATICS: No lymphadenopathy CARDIAC: RRR, no murmurs, rubs, gallops RESPIRATORY:  Clear to auscultation without rales, wheezing or rhonchi  ABDOMEN: Soft, non-tender,  non-distended MUSCULOSKELETAL:  No edema; No deformity  SKIN: Warm and dry NEUROLOGIC:  Alert and oriented x 3 PSYCHIATRIC:  Normal affect     Signed, Shirlee More, MD  12/14/2021 3:35 PM    [facility]

## 2021-12-14 NOTE — Patient Instructions (Signed)
Medication Instructions:  Your physician recommends that you continue on your current medications as directed. Please refer to the Current Medication list given to you today.  *If you need a refill on your cardiac medications before your next appointment, please call your pharmacy*   Lab Work: None If you have labs (blood work) drawn today and your tests are completely normal, you will receive your results only by: [place] (if you have MyChart) OR A paper copy in the mail If you have any lab test that is abnormal or we need to change your treatment, we will call you to review the results.   Testing/Procedures: Your physician has requested that you have an echocardiogram. Echocardiography is a painless test that uses sound waves to create images of your heart. It provides your doctor with information about the size and shape of your heart and how well your hearts chambers and valves are working. This procedure takes approximately one hour. There are no restrictions for this procedure.    Follow-Up: At [facility], you and your health needs are our priority.  As part of our continuing mission to provide you with exceptional heart care, we have created designated Provider Care Teams.  These Care Teams include your primary Cardiologist (physician) and Advanced Practice Providers (APPs -  Physician Assistants and Nurse Practitioners) who all work together to provide you with the care you need, when you need it.  We recommend signing up for the patient portal called "MyChart".  Sign up information is provided on this After Visit Summary.  MyChart is used to connect with patients for Virtual Visits (Telemedicine).  Patients are able to view lab/test results, encounter notes, upcoming appointments, etc.  Non-urgent messages can be sent to your provider as well.   To learn more about what you can do with MyChart, go to [email or web address].    Your next appointment:   3  month(s)  The format for your next appointment:   In Person  Provider:   Shirlee More, MD    Other Instructions Check heart rate and blood pressure at home

## 2021-12-22 ENCOUNTER — Other Ambulatory Visit (HOSPITAL_COMMUNITY): Payer: Self-pay | Admitting: Adult Health

## 2021-12-24 ENCOUNTER — Telehealth: Payer: Self-pay | Admitting: Cardiology

## 2021-12-24 MED ORDER — FUROSEMIDE 20 MG PO TABS: 20.0000 mg | ORAL_TABLET | Freq: Every day | ORAL | 3 refills | Status: DC

## 2021-12-24 MED ORDER — RIVAROXABAN 20 MG PO TABS: 20.0000 mg | ORAL_TABLET | Freq: Every day | ORAL | 2 refills | Status: DC

## 2021-12-24 MED ORDER — SPIRONOLACTONE 25 MG PO TABS: 12.5000 mg | ORAL_TABLET | Freq: Every day | ORAL | 3 refills | Status: DC

## 2021-12-24 MED ORDER — METOPROLOL SUCCINATE ER 25 MG PO TB24: 12.5000 mg | ORAL_TABLET | Freq: Every day | ORAL | 3 refills | Status: DC

## 2021-12-24 NOTE — Telephone Encounter (Signed)
Refills of Aldactone 25 mg, Furosemide 20 mg, Metoprolol Succinate 25 mg, Xarelto 20 mg [facility] [address], [place].

## 2021-12-24 NOTE — Telephone Encounter (Signed)
°*  STAT* If patient is at the pharmacy, call can be transferred to refill team.   1. Which medications need to be refilled? (please list name of each medication and dose if known)  furosemide (LASIX) 20 MG tablet spironolactone (ALDACTONE) 25 MG tablet metoprolol succinate (TOPROL-XL) 25 MG 24 hr tablet rivaroxaban (XARELTO) 20 MG TABS tablet  2. Which pharmacy/location (including street and city if local pharmacy) is medication to be sent to? [id number] [place] [place] [address]  3. Do they need a 30 day or 90 day supply? 90 day   Patient has 1-2 days left

## 2021-12-27 DIAGNOSIS — H9313 Tinnitus, bilateral: Secondary | ICD-10-CM | POA: Diagnosis not present

## 2021-12-27 DIAGNOSIS — Z6824 Body mass index (BMI) 24.0-24.9, adult: Secondary | ICD-10-CM | POA: Diagnosis not present

## 2021-12-27 DIAGNOSIS — H9193 Unspecified hearing loss, bilateral: Secondary | ICD-10-CM | POA: Diagnosis not present

## 2022-01-03 ENCOUNTER — Ambulatory Visit: Payer: Medicare HMO | Admitting: Pulmonary Disease

## 2022-01-04 ENCOUNTER — Telehealth: Payer: Self-pay | Admitting: Pulmonary Disease

## 2022-01-04 NOTE — Telephone Encounter (Signed)
ATC patient about CT scan results. LMTCB ? ?Dr. Valeta Harms have to received CT scan results on this patient from [facility]? ?

## 2022-01-07 NOTE — Telephone Encounter (Signed)
CT Chest 11-30-21 given to Dr Valeta Harms to review ?

## 2022-01-07 NOTE — Telephone Encounter (Signed)
[name] daughter [facility] sent CT results this morning. [name] phone number is [phone] until 3:00 pm. ?

## 2022-01-10 NOTE — Telephone Encounter (Signed)
I called the daughter on DPR and she voices understanding. She did not have any questions. Nothing further needed.  ?

## 2022-01-21 DIAGNOSIS — M542 Cervicalgia: Secondary | ICD-10-CM | POA: Diagnosis not present

## 2022-01-21 DIAGNOSIS — M8580 Other specified disorders of bone density and structure, unspecified site: Secondary | ICD-10-CM | POA: Diagnosis not present

## 2022-01-21 DIAGNOSIS — I10 Essential (primary) hypertension: Secondary | ICD-10-CM | POA: Diagnosis not present

## 2022-01-21 DIAGNOSIS — I25119 Atherosclerotic heart disease of native coronary artery with unspecified angina pectoris: Secondary | ICD-10-CM | POA: Diagnosis not present

## 2022-01-21 DIAGNOSIS — D6869 Other thrombophilia: Secondary | ICD-10-CM | POA: Diagnosis not present

## 2022-01-21 DIAGNOSIS — I428 Other cardiomyopathies: Secondary | ICD-10-CM | POA: Diagnosis not present

## 2022-01-21 DIAGNOSIS — M48061 Spinal stenosis, lumbar region without neurogenic claudication: Secondary | ICD-10-CM | POA: Diagnosis not present

## 2022-01-21 DIAGNOSIS — I480 Paroxysmal atrial fibrillation: Secondary | ICD-10-CM | POA: Diagnosis not present

## 2022-01-21 DIAGNOSIS — Z0000 Encounter for general adult medical examination without abnormal findings: Secondary | ICD-10-CM | POA: Diagnosis not present

## 2022-01-21 DIAGNOSIS — I5020 Unspecified systolic (congestive) heart failure: Secondary | ICD-10-CM | POA: Diagnosis not present

## 2022-01-29 DIAGNOSIS — H04123 Dry eye syndrome of bilateral lacrimal glands: Secondary | ICD-10-CM | POA: Diagnosis not present

## 2022-01-29 DIAGNOSIS — H40013 Open angle with borderline findings, low risk, bilateral: Secondary | ICD-10-CM | POA: Diagnosis not present

## 2022-02-07 DIAGNOSIS — M542 Cervicalgia: Secondary | ICD-10-CM | POA: Diagnosis not present

## 2022-02-18 ENCOUNTER — Ambulatory Visit (INDEPENDENT_AMBULATORY_CARE_PROVIDER_SITE_OTHER): Payer: Medicare HMO

## 2022-02-18 DIAGNOSIS — I2510 Atherosclerotic heart disease of native coronary artery without angina pectoris: Secondary | ICD-10-CM

## 2022-02-18 DIAGNOSIS — I480 Paroxysmal atrial fibrillation: Secondary | ICD-10-CM | POA: Diagnosis not present

## 2022-02-18 DIAGNOSIS — Z79899 Other long term (current) drug therapy: Secondary | ICD-10-CM

## 2022-02-18 DIAGNOSIS — Z7901 Long term (current) use of anticoagulants: Secondary | ICD-10-CM | POA: Diagnosis not present

## 2022-02-18 DIAGNOSIS — I5022 Chronic systolic (congestive) heart failure: Secondary | ICD-10-CM

## 2022-02-18 LAB — ECHOCARDIOGRAM COMPLETE
Area-P 1/2: 2.34 cm2
Calc EF: 44.7 %
P 1/2 time: 565 msec
S' Lateral: 2.9 cm
Single Plane A2C EF: 43.3 %
Single Plane A4C EF: 51.1 %

## 2022-02-19 ENCOUNTER — Telehealth: Payer: Self-pay | Admitting: Cardiology

## 2022-02-19 ENCOUNTER — Telehealth: Payer: Self-pay

## 2022-02-19 DIAGNOSIS — D485 Neoplasm of uncertain behavior of skin: Secondary | ICD-10-CM | POA: Diagnosis not present

## 2022-02-19 DIAGNOSIS — H04523 Eversion of bilateral lacrimal punctum: Secondary | ICD-10-CM | POA: Diagnosis not present

## 2022-02-19 DIAGNOSIS — H02132 Senile ectropion of right lower eyelid: Secondary | ICD-10-CM | POA: Diagnosis not present

## 2022-02-19 DIAGNOSIS — H02135 Senile ectropion of left lower eyelid: Secondary | ICD-10-CM | POA: Diagnosis not present

## 2022-02-19 NOTE — Telephone Encounter (Signed)
Pt's daughter calling to f/u on pt's ECHO results. Please advise ?

## 2022-02-19 NOTE — Telephone Encounter (Signed)
Unable to reach the patient, letter mailed with results ?

## 2022-02-19 NOTE — Telephone Encounter (Signed)
-----   Message from Richardo Priest, MD sent at 02/18/2022 12:29 PM EDT ----- ?Good result heart muscle function is improved continue the good work no change in medication ?

## 2022-02-20 NOTE — Telephone Encounter (Signed)
Patient's daughter informed of result. ?

## 2022-02-20 NOTE — Telephone Encounter (Signed)
Follow Up: ? ? ? ? ? ?Patient's daughter is returning a call from yesterday, concerning her test results. ?

## 2022-02-21 DIAGNOSIS — M170 Bilateral primary osteoarthritis of knee: Secondary | ICD-10-CM | POA: Diagnosis not present

## 2022-02-26 DIAGNOSIS — H903 Sensorineural hearing loss, bilateral: Secondary | ICD-10-CM | POA: Diagnosis not present

## 2022-02-26 DIAGNOSIS — H9113 Presbycusis, bilateral: Secondary | ICD-10-CM | POA: Diagnosis not present

## 2022-02-27 DIAGNOSIS — I5020 Unspecified systolic (congestive) heart failure: Secondary | ICD-10-CM | POA: Diagnosis not present

## 2022-02-27 DIAGNOSIS — Z6824 Body mass index (BMI) 24.0-24.9, adult: Secondary | ICD-10-CM | POA: Diagnosis not present

## 2022-02-27 DIAGNOSIS — I25119 Atherosclerotic heart disease of native coronary artery with unspecified angina pectoris: Secondary | ICD-10-CM | POA: Diagnosis not present

## 2022-02-27 DIAGNOSIS — I700 Atherosclerosis of aorta: Secondary | ICD-10-CM | POA: Diagnosis not present

## 2022-02-27 DIAGNOSIS — R69 Illness, unspecified: Secondary | ICD-10-CM | POA: Diagnosis not present

## 2022-02-27 DIAGNOSIS — I480 Paroxysmal atrial fibrillation: Secondary | ICD-10-CM | POA: Diagnosis not present

## 2022-02-27 DIAGNOSIS — F411 Generalized anxiety disorder: Secondary | ICD-10-CM | POA: Diagnosis not present

## 2022-03-09 NOTE — Progress Notes (Signed)
?Cardiology Office Note:   ? ?Date:  03/11/2022  ? ?ID:  [name], DOB 09/05/1941, MRN [id number] ? ?PCP:  Street, [name], MD  ?Cardiologist:  Shirlee More, MD   ? ?Referring MD: Street, [name], *  ? ? ?ASSESSMENT:   ? ?1. PAF (paroxysmal atrial fibrillation) ([place])   ?2. Chronic anticoagulation   ?3. Chronic combined systolic and diastolic heart failure ([place])   ?4. Mild CAD   ? ?PLAN:   ? ?In order of problems listed above: ? ?She continues to do well with no recurrent atrial fibrillation not on antiarrhythmic direction continue beta-blocker at current anticoagulant I told the patient and her daughter if she has recurrence we need to know immediately as she developed a severe dilated cardiomyopathy in the setting of rapid atrial fibrillation.  If she has clinical recurrence and favor cardioversion referral to EP for catheter ablation or antiarrhythmic drug therapy resuming amiodarone ?Heart failure is compensated she has no fluid overload continue her loop diuretic and beta-blocker.  She is intolerant of Entresto with hypotension ?Stable CAD having no anginal discomfort ?She anticipates eye surgery for a cyst external to the eye I told her to hold her anticoagulant 2 days before 2 days after what seems to be a cosmetic procedure ? ? ?Next appointment: 6 months ? ? ?Medication Adjustments/Labs and Tests Ordered: ?Current medicines are reviewed at length with the patient today.  Concerns regarding medicines are outlined above.  ?No orders of the defined types were placed in this encounter. ? ?No orders of the defined types were placed in this encounter. ? ? ?Chief complaint follow-up for cardiomyopathy and atrial fibrillation ? ? ?History of Present Illness:   ? ?[name] is a 81 y.o. female with a hx of heart failure probable tachycardia induced cardiomyopathy mild nonobstructive CAD paroxysmal atrial fibrillation with anticoagulation maintaining sinus rhythm on amiodarone last seen  12/14/2021. ? ?She has a history of  questionable prior atrial fibrillation previously on anticoagulants. She was admitted 10/24/21 from [facility] with increased shortness of breath. Echo at [facility] EF 25-30%. CTA negative for PE. BNP 3670. Had cath with min CAD and preserved CO. Started IV amio with conversion to SR. Placed on Xarelto. GDMT limited by hypotension. Discharged on metoprolol and lasix.  ?  ?She was seen in the heart failure clinic in [facility] 11/05/2021 for tachycardia induced cardiomyopathy continue on low-dose loop diuretic beta-blocker MRA SGLT2 inhibitor and her Delene Loll was held due to symptomatic hypotension.  She is maintaining sinus rhythm and continued with anticoagulation.  ? ?Compliance with diet, lifestyle and medications: Yes ? ?Her daughter is present participates in evaluation decision making ?She has a smart watch and has had no alarms for high or low rate or atrial fibrillation ?She feels good is not having cardiovascular symptoms of edema shortness of breath chest pain palpitation or syncope ?She tolerates her anticoagulant without bleeding. ?Recent labs 11/05/2021 hemoglobin 14.2 11/27/2021 creatinine 1.0 potassium 4.2 ? ?An echocardiogram performed 02/18/2022. ?Left ventricular ejection fraction mildly reduced 45 to 09% grade 1 diastolic dysfunction.  Right ventricle normal size function and pulmonary artery pressure.  There is mild aortic regurgitation. ? 1. Left ventricular ejection fraction, by estimation, is 45 to 50%. The  ?left ventricle has mildly decreased function. The left ventricle has no  ?regional wall motion abnormalities. There is mild concentric left  ?ventricular hypertrophy. Left ventricular  ?diastolic parameters are consistent with Grade I diastolic dysfunction  ?(impaired relaxation).  ? 2. Right  ventricular systolic function is normal. The right ventricular  ?size is normal. There is normal pulmonary artery systolic pressure.  ? 3. The mitral valve is  normal in structure. No evidence of mitral valve  ?regurgitation. No evidence of mitral stenosis.  ? 4. The aortic valve is tricuspid. Aortic valve regurgitation is mild.  ?Aortic valve sclerosis/calcification is present, without any evidence of  ?aortic stenosis.  ? 5. The inferior vena cava is normal in size with greater than 50%  ?respiratory variability, suggesting right atrial pressure of 3 mmHg.  ?Past Medical History:  ?Diagnosis Date  ? AML (acute myelogenous leukemia) ([place])   ? Anxiety   ? Atrial fibrillation ([place])   ? Barrett's esophagus   ? Basal cell carcinoma of vulva ([place])   ? Depression   ? Endometrial polyp   ? "polyps" per intake sheet  ? GERD (gastroesophageal reflux disease)   ? Hemorrhoids   ? Hypertension   ? Spinal stenosis   ? Weakness   ? ? ?Past Surgical History:  ?Procedure Laterality Date  ? CATARACT EXTRACTION, BILATERAL    ? august/september 2017  ? cataract surgery    ? CERVICAL POLYPECTOMY N/A 08/14/2015  ? Procedure: CERVICAL POLYPECTOMY;  Surgeon: Newton Pigg, MD;  Location: [facility];  Service: Gynecology;  Laterality: N/A;  ? CHOLECYSTECTOMY    ? endometrial polyp surgery    ? HEMORROIDECTOMY    ? HYSTEROSCOPY WITH D & C N/A 08/14/2015  ? Procedure: DILATATION AND CURETTAGE /HYSTEROSCOPY;  Surgeon: Newton Pigg, MD;  Location: [facility];  Service: Gynecology;  Laterality: N/A;  ? KNEE SURGERY Right   ? PORTACATH PLACEMENT    ? RIGHT/LEFT HEART CATH AND CORONARY ANGIOGRAPHY N/A 10/24/2021  ? Procedure: RIGHT/LEFT HEART CATH AND CORONARY ANGIOGRAPHY;  Surgeon: Sherren Mocha, MD;  Location: [facility];  Service: Cardiovascular;  Laterality: N/A;  ? TUNNELED VENOUS CATHETER PLACEMENT    ? ? ?Current Medications: ?Current Meds  ?Medication Sig  ? Cholecalciferol (VITAMIN D) 125 MCG (5000 UT) CAPS Take 5,000 Units by mouth daily.  ? diazepam (VALIUM) 10 MG tablet Take 10 mg by mouth 2 (two) times daily. Takes an additional tablet if needed in the afternoon  ? famotidine (PEPCID)  40 MG tablet Take 40 mg by mouth daily.  ? furosemide (LASIX) 20 MG tablet Take 1 tablet (20 mg total) by mouth daily.  ? metoprolol succinate (TOPROL-XL) 25 MG 24 hr tablet Take 0.5 tablets (12.5 mg total) by mouth daily.  ? pantoprazole (PROTONIX) 40 MG tablet Take 1 tablet (40 mg total) by mouth daily.  ? rivaroxaban (XARELTO) 20 MG TABS tablet Take 1 tablet (20 mg total) by mouth daily with supper.  ? venlafaxine XR (EFFEXOR XR) 37.5 MG 24 hr capsule Take 1 capsule (37.5 mg total) by mouth daily with breakfast. Please follow-up with PCP for further refills.  ? vitamin B-12 (CYANOCOBALAMIN) 1000 MCG tablet Take 1,000 mcg by mouth daily.  ?  ? ?Allergies:   Entresto [sacubitril-valsartan], Compazine [prochlorperazine edisylate], Cortisone, Eliquis [apixaban], Prednisone, and Prochlorperazine  ? ?Social History  ? ?Socioeconomic History  ? Marital status: Married  ?  Spouse name: Kaori Jumper  ? Number of children: 1  ? Years of education: Not on file  ? Highest education level: High school graduate  ?Occupational History  ? Occupation: retired  ?Tobacco Use  ? Smoking status: Never  ?  Passive exposure: Never  ? Smokeless tobacco: Never  ?Vaping Use  ? Vaping Use: Never  used  ?Substance and Sexual Activity  ? Alcohol use: No  ? Drug use: No  ? Sexual activity: Not on file  ?Other Topics Concern  ? Not on file  ?Social History Narrative  ? Lives at home with husband  ? Right handed  ? Caffeine: quit years ago  ? ?Social Determinants of Health  ? ?Financial Resource Strain: Low Risk   ? Difficulty of Paying Living Expenses: Not hard at all  ?Food Insecurity: No Food Insecurity  ? Worried About Charity fundraiser in the Last Year: Never true  ? Ran Out of Food in the Last Year: Never true  ?Transportation Needs: No Transportation Needs  ? Lack of Transportation (Medical): No  ? Lack of Transportation (Non-Medical): No  ?Physical Activity: Not on file  ?Stress: Not on file  ?Social Connections: Not on file  ?   ? ?Family History: ?The patient's family history includes Alzheimer's disease in her brother; Breast cancer in her paternal aunt; CVA in her mother; Diabetes in her mother; Heart attack in her brother and mother; Orma Flaming

## 2022-03-11 ENCOUNTER — Ambulatory Visit: Payer: Medicare HMO | Admitting: Cardiology

## 2022-03-11 ENCOUNTER — Encounter: Payer: Self-pay | Admitting: Cardiology

## 2022-03-11 VITALS — BP 120/68 | HR 78 | Ht 68.0 in | Wt 165.4 lb

## 2022-03-11 DIAGNOSIS — I480 Paroxysmal atrial fibrillation: Secondary | ICD-10-CM | POA: Diagnosis not present

## 2022-03-11 DIAGNOSIS — Z7901 Long term (current) use of anticoagulants: Secondary | ICD-10-CM | POA: Diagnosis not present

## 2022-03-11 DIAGNOSIS — I2510 Atherosclerotic heart disease of native coronary artery without angina pectoris: Secondary | ICD-10-CM

## 2022-03-11 DIAGNOSIS — I5042 Chronic combined systolic (congestive) and diastolic (congestive) heart failure: Secondary | ICD-10-CM | POA: Diagnosis not present

## 2022-03-11 NOTE — Patient Instructions (Signed)
Medication Instructions:  ?Your physician recommends that you continue on your current medications as directed. Please refer to the Current Medication list given to you today. ? ?*If you need a refill on your cardiac medications before your next appointment, please call your pharmacy* ? ? ?Lab Work: ?None ?If you have labs (blood work) drawn today and your tests are completely normal, you will receive your results only by: ?MyChart Message (if you have MyChart) OR ?A paper copy in the mail ?If you have any lab test that is abnormal or we need to change your treatment, we will call you to review the results. ? ? ?Testing/Procedures: ?None ? ? ?Follow-Up: ?At [facility], you and your health needs are our priority.  As part of our continuing mission to provide you with exceptional heart care, we have created designated Provider Care Teams.  These Care Teams include your primary Cardiologist (physician) and Advanced Practice Providers (APPs -  Physician Assistants and Nurse Practitioners) who all work together to provide you with the care you need, when you need it. ? ?We recommend signing up for the patient portal called "MyChart".  Sign up information is provided on this After Visit Summary.  MyChart is used to connect with patients for Virtual Visits (Telemedicine).  Patients are able to view lab/test results, encounter notes, upcoming appointments, etc.  Non-urgent messages can be sent to your provider as well.   ?To learn more about what you can do with MyChart, go to [email or web address].   ? ?Your next appointment:   ?6 month(s) ? ?The format for your next appointment:   ?In Person ? ?Provider:   ?Shirlee More, MD  ? ? ?Other Instructions ?Hold Xarelto for 2 doses before and after eye surgery ? ?Important Information About Sugar ? ? ? ? ? ? ?

## 2022-03-16 ENCOUNTER — Encounter: Payer: Self-pay | Admitting: Cardiology

## 2022-03-16 DIAGNOSIS — I10 Essential (primary) hypertension: Secondary | ICD-10-CM | POA: Diagnosis not present

## 2022-03-16 DIAGNOSIS — Z20822 Contact with and (suspected) exposure to covid-19: Secondary | ICD-10-CM | POA: Diagnosis not present

## 2022-03-16 DIAGNOSIS — I700 Atherosclerosis of aorta: Secondary | ICD-10-CM | POA: Diagnosis not present

## 2022-03-16 DIAGNOSIS — I4891 Unspecified atrial fibrillation: Secondary | ICD-10-CM | POA: Diagnosis not present

## 2022-03-16 DIAGNOSIS — E785 Hyperlipidemia, unspecified: Secondary | ICD-10-CM | POA: Diagnosis not present

## 2022-03-16 DIAGNOSIS — I428 Other cardiomyopathies: Secondary | ICD-10-CM | POA: Diagnosis not present

## 2022-03-16 DIAGNOSIS — I5023 Acute on chronic systolic (congestive) heart failure: Secondary | ICD-10-CM | POA: Diagnosis not present

## 2022-03-16 DIAGNOSIS — R0602 Shortness of breath: Secondary | ICD-10-CM | POA: Diagnosis not present

## 2022-03-16 DIAGNOSIS — I110 Hypertensive heart disease with heart failure: Secondary | ICD-10-CM | POA: Diagnosis not present

## 2022-03-16 DIAGNOSIS — Z7982 Long term (current) use of aspirin: Secondary | ICD-10-CM | POA: Diagnosis not present

## 2022-03-16 DIAGNOSIS — I509 Heart failure, unspecified: Secondary | ICD-10-CM | POA: Diagnosis not present

## 2022-03-16 DIAGNOSIS — Z79899 Other long term (current) drug therapy: Secondary | ICD-10-CM | POA: Diagnosis not present

## 2022-03-17 DIAGNOSIS — Z79899 Other long term (current) drug therapy: Secondary | ICD-10-CM | POA: Diagnosis not present

## 2022-03-17 DIAGNOSIS — I110 Hypertensive heart disease with heart failure: Secondary | ICD-10-CM | POA: Diagnosis not present

## 2022-03-17 DIAGNOSIS — I5023 Acute on chronic systolic (congestive) heart failure: Secondary | ICD-10-CM | POA: Diagnosis not present

## 2022-03-30 DIAGNOSIS — X500XXA Overexertion from strenuous movement or load, initial encounter: Secondary | ICD-10-CM | POA: Diagnosis not present

## 2022-03-30 DIAGNOSIS — S29019A Strain of muscle and tendon of unspecified wall of thorax, initial encounter: Secondary | ICD-10-CM | POA: Diagnosis not present

## 2022-03-30 DIAGNOSIS — R079 Chest pain, unspecified: Secondary | ICD-10-CM | POA: Diagnosis not present

## 2022-03-30 DIAGNOSIS — R0602 Shortness of breath: Secondary | ICD-10-CM | POA: Diagnosis not present

## 2022-04-03 DIAGNOSIS — R69 Illness, unspecified: Secondary | ICD-10-CM | POA: Diagnosis not present

## 2022-04-04 DIAGNOSIS — R69 Illness, unspecified: Secondary | ICD-10-CM | POA: Diagnosis not present

## 2022-04-04 DIAGNOSIS — F411 Generalized anxiety disorder: Secondary | ICD-10-CM | POA: Diagnosis not present

## 2022-04-04 DIAGNOSIS — I480 Paroxysmal atrial fibrillation: Secondary | ICD-10-CM | POA: Diagnosis not present

## 2022-04-04 DIAGNOSIS — I25119 Atherosclerotic heart disease of native coronary artery with unspecified angina pectoris: Secondary | ICD-10-CM | POA: Diagnosis not present

## 2022-04-04 DIAGNOSIS — Z9181 History of falling: Secondary | ICD-10-CM | POA: Diagnosis not present

## 2022-04-04 DIAGNOSIS — D6869 Other thrombophilia: Secondary | ICD-10-CM | POA: Diagnosis not present

## 2022-04-04 DIAGNOSIS — Z6824 Body mass index (BMI) 24.0-24.9, adult: Secondary | ICD-10-CM | POA: Diagnosis not present

## 2022-04-04 DIAGNOSIS — I5020 Unspecified systolic (congestive) heart failure: Secondary | ICD-10-CM | POA: Diagnosis not present

## 2022-04-11 DIAGNOSIS — R69 Illness, unspecified: Secondary | ICD-10-CM | POA: Diagnosis not present

## 2022-04-12 DIAGNOSIS — D485 Neoplasm of uncertain behavior of skin: Secondary | ICD-10-CM | POA: Diagnosis not present

## 2022-04-12 DIAGNOSIS — C441122 Basal cell carcinoma of skin of right lower eyelid, including canthus: Secondary | ICD-10-CM | POA: Diagnosis not present

## 2022-04-12 DIAGNOSIS — L720 Epidermal cyst: Secondary | ICD-10-CM | POA: Diagnosis not present

## 2022-04-17 DIAGNOSIS — Z5321 Procedure and treatment not carried out due to patient leaving prior to being seen by health care provider: Secondary | ICD-10-CM | POA: Diagnosis not present

## 2022-04-17 DIAGNOSIS — R0602 Shortness of breath: Secondary | ICD-10-CM | POA: Diagnosis not present

## 2022-04-26 DIAGNOSIS — D2310 Other benign neoplasm of skin of unspecified eyelid, including canthus: Secondary | ICD-10-CM | POA: Diagnosis not present

## 2022-04-26 DIAGNOSIS — L900 Lichen sclerosus et atrophicus: Secondary | ICD-10-CM | POA: Diagnosis not present

## 2022-04-26 DIAGNOSIS — D485 Neoplasm of uncertain behavior of skin: Secondary | ICD-10-CM | POA: Diagnosis not present

## 2022-04-26 DIAGNOSIS — C4491 Basal cell carcinoma of skin, unspecified: Secondary | ICD-10-CM | POA: Diagnosis not present

## 2022-05-07 DIAGNOSIS — R69 Illness, unspecified: Secondary | ICD-10-CM | POA: Diagnosis not present

## 2022-05-27 DIAGNOSIS — R69 Illness, unspecified: Secondary | ICD-10-CM | POA: Diagnosis not present

## 2022-05-27 DIAGNOSIS — I480 Paroxysmal atrial fibrillation: Secondary | ICD-10-CM | POA: Diagnosis not present

## 2022-05-30 DIAGNOSIS — M170 Bilateral primary osteoarthritis of knee: Secondary | ICD-10-CM | POA: Diagnosis not present

## 2022-06-22 DIAGNOSIS — W19XXXA Unspecified fall, initial encounter: Secondary | ICD-10-CM | POA: Diagnosis not present

## 2022-06-22 DIAGNOSIS — S0990XA Unspecified injury of head, initial encounter: Secondary | ICD-10-CM | POA: Diagnosis not present

## 2022-06-22 DIAGNOSIS — S42252A Displaced fracture of greater tuberosity of left humerus, initial encounter for closed fracture: Secondary | ICD-10-CM | POA: Diagnosis not present

## 2022-06-22 DIAGNOSIS — M79603 Pain in arm, unspecified: Secondary | ICD-10-CM | POA: Diagnosis not present

## 2022-06-22 DIAGNOSIS — Z743 Need for continuous supervision: Secondary | ICD-10-CM | POA: Diagnosis not present

## 2022-06-22 DIAGNOSIS — S199XXA Unspecified injury of neck, initial encounter: Secondary | ICD-10-CM | POA: Diagnosis not present

## 2022-06-22 DIAGNOSIS — M25512 Pain in left shoulder: Secondary | ICD-10-CM | POA: Diagnosis not present

## 2022-06-22 DIAGNOSIS — S42292A Other displaced fracture of upper end of left humerus, initial encounter for closed fracture: Secondary | ICD-10-CM | POA: Diagnosis not present

## 2022-06-22 DIAGNOSIS — S42302A Unspecified fracture of shaft of humerus, left arm, initial encounter for closed fracture: Secondary | ICD-10-CM | POA: Diagnosis not present

## 2022-06-22 DIAGNOSIS — M47812 Spondylosis without myelopathy or radiculopathy, cervical region: Secondary | ICD-10-CM | POA: Diagnosis not present

## 2022-06-22 DIAGNOSIS — S0003XA Contusion of scalp, initial encounter: Secondary | ICD-10-CM | POA: Diagnosis not present

## 2022-06-22 DIAGNOSIS — S0083XA Contusion of other part of head, initial encounter: Secondary | ICD-10-CM | POA: Diagnosis not present

## 2022-06-22 DIAGNOSIS — S0181XA Laceration without foreign body of other part of head, initial encounter: Secondary | ICD-10-CM | POA: Diagnosis not present

## 2022-06-22 DIAGNOSIS — W1809XA Striking against other object with subsequent fall, initial encounter: Secondary | ICD-10-CM | POA: Diagnosis not present

## 2022-06-22 DIAGNOSIS — M7989 Other specified soft tissue disorders: Secondary | ICD-10-CM | POA: Diagnosis not present

## 2022-06-22 DIAGNOSIS — R52 Pain, unspecified: Secondary | ICD-10-CM | POA: Diagnosis not present

## 2022-06-22 DIAGNOSIS — R079 Chest pain, unspecified: Secondary | ICD-10-CM | POA: Diagnosis not present

## 2022-06-24 DIAGNOSIS — S42252A Displaced fracture of greater tuberosity of left humerus, initial encounter for closed fracture: Secondary | ICD-10-CM | POA: Diagnosis not present

## 2022-06-27 DIAGNOSIS — I480 Paroxysmal atrial fibrillation: Secondary | ICD-10-CM | POA: Diagnosis not present

## 2022-06-27 DIAGNOSIS — R69 Illness, unspecified: Secondary | ICD-10-CM | POA: Diagnosis not present

## 2022-07-05 ENCOUNTER — Telehealth: Payer: Self-pay | Admitting: Cardiology

## 2022-07-05 ENCOUNTER — Other Ambulatory Visit: Payer: Self-pay

## 2022-07-05 NOTE — Telephone Encounter (Signed)
Called the patient and explained that we don't have any samples of Xarelto 20 mg in the [place] office but we could bring some from the [facility] office and she could pick them up on Monday. Patient was agreeable with this plan and will come to pick up the samples after 9 am on Monday. Patient had no further questions at this time.

## 2022-07-05 NOTE — Telephone Encounter (Signed)
  Patient calling the office for samples of medication:   1.  What medication and dosage are you requesting samples for? Xarelto  2.  Are you currently out of this medication? Only 2 pills left  Pt's daughter calling, she said, pt only have 2 pills left and requesting samples while waiting for pt's prescription. She said, to cal her at [phone] and if she unable to answer to leave her a detailed message

## 2022-07-05 NOTE — Telephone Encounter (Signed)
Will forward this request to onsite clinical team and refill dept, to determine if pt can receive samples of xarelto, while awaiting for the prescription from her pharmacy.  Pt is waiting on this prescription from her pharmacy, and only has 2 pills left.   Onsite clinical to call the pts daughter back when samples are ready to be picked up.

## 2022-07-10 DIAGNOSIS — S42252A Displaced fracture of greater tuberosity of left humerus, initial encounter for closed fracture: Secondary | ICD-10-CM | POA: Diagnosis not present

## 2022-07-15 DIAGNOSIS — S46012A Strain of muscle(s) and tendon(s) of the rotator cuff of left shoulder, initial encounter: Secondary | ICD-10-CM | POA: Diagnosis not present

## 2022-07-15 DIAGNOSIS — M25512 Pain in left shoulder: Secondary | ICD-10-CM | POA: Diagnosis not present

## 2022-07-15 DIAGNOSIS — S42252A Displaced fracture of greater tuberosity of left humerus, initial encounter for closed fracture: Secondary | ICD-10-CM | POA: Diagnosis not present

## 2022-07-15 DIAGNOSIS — M25812 Other specified joint disorders, left shoulder: Secondary | ICD-10-CM | POA: Diagnosis not present

## 2022-07-15 DIAGNOSIS — M19012 Primary osteoarthritis, left shoulder: Secondary | ICD-10-CM | POA: Diagnosis not present

## 2022-07-19 DIAGNOSIS — R69 Illness, unspecified: Secondary | ICD-10-CM | POA: Diagnosis not present

## 2022-07-19 DIAGNOSIS — F411 Generalized anxiety disorder: Secondary | ICD-10-CM | POA: Diagnosis not present

## 2022-07-19 DIAGNOSIS — S42255D Nondisplaced fracture of greater tuberosity of left humerus, subsequent encounter for fracture with routine healing: Secondary | ICD-10-CM | POA: Diagnosis not present

## 2022-07-19 DIAGNOSIS — Z6824 Body mass index (BMI) 24.0-24.9, adult: Secondary | ICD-10-CM | POA: Diagnosis not present

## 2022-07-19 DIAGNOSIS — R2232 Localized swelling, mass and lump, left upper limb: Secondary | ICD-10-CM | POA: Diagnosis not present

## 2022-07-27 DIAGNOSIS — R69 Illness, unspecified: Secondary | ICD-10-CM | POA: Diagnosis not present

## 2022-07-27 DIAGNOSIS — I480 Paroxysmal atrial fibrillation: Secondary | ICD-10-CM | POA: Diagnosis not present

## 2022-07-31 DIAGNOSIS — Z23 Encounter for immunization: Secondary | ICD-10-CM | POA: Diagnosis not present

## 2022-08-01 DIAGNOSIS — S81801A Unspecified open wound, right lower leg, initial encounter: Secondary | ICD-10-CM | POA: Diagnosis not present

## 2022-08-01 DIAGNOSIS — T148XXA Other injury of unspecified body region, initial encounter: Secondary | ICD-10-CM | POA: Insufficient documentation

## 2022-08-01 DIAGNOSIS — S81802A Unspecified open wound, left lower leg, initial encounter: Secondary | ICD-10-CM | POA: Diagnosis not present

## 2022-08-07 DIAGNOSIS — H26493 Other secondary cataract, bilateral: Secondary | ICD-10-CM | POA: Diagnosis not present

## 2022-08-07 DIAGNOSIS — H524 Presbyopia: Secondary | ICD-10-CM | POA: Diagnosis not present

## 2022-08-07 DIAGNOSIS — H40013 Open angle with borderline findings, low risk, bilateral: Secondary | ICD-10-CM | POA: Diagnosis not present

## 2022-08-07 DIAGNOSIS — H18593 Other hereditary corneal dystrophies, bilateral: Secondary | ICD-10-CM | POA: Diagnosis not present

## 2022-08-07 DIAGNOSIS — H04123 Dry eye syndrome of bilateral lacrimal glands: Secondary | ICD-10-CM | POA: Diagnosis not present

## 2022-08-13 DIAGNOSIS — M25612 Stiffness of left shoulder, not elsewhere classified: Secondary | ICD-10-CM | POA: Diagnosis not present

## 2022-08-13 DIAGNOSIS — M6281 Muscle weakness (generalized): Secondary | ICD-10-CM | POA: Diagnosis not present

## 2022-08-13 DIAGNOSIS — M25512 Pain in left shoulder: Secondary | ICD-10-CM | POA: Diagnosis not present

## 2022-08-13 DIAGNOSIS — M25412 Effusion, left shoulder: Secondary | ICD-10-CM | POA: Diagnosis not present

## 2022-08-26 DIAGNOSIS — M25612 Stiffness of left shoulder, not elsewhere classified: Secondary | ICD-10-CM | POA: Diagnosis not present

## 2022-08-26 DIAGNOSIS — S42252A Displaced fracture of greater tuberosity of left humerus, initial encounter for closed fracture: Secondary | ICD-10-CM | POA: Diagnosis not present

## 2022-08-26 DIAGNOSIS — M25412 Effusion, left shoulder: Secondary | ICD-10-CM | POA: Diagnosis not present

## 2022-08-26 DIAGNOSIS — M6281 Muscle weakness (generalized): Secondary | ICD-10-CM | POA: Diagnosis not present

## 2022-08-26 DIAGNOSIS — M25512 Pain in left shoulder: Secondary | ICD-10-CM | POA: Diagnosis not present

## 2022-08-29 DIAGNOSIS — M170 Bilateral primary osteoarthritis of knee: Secondary | ICD-10-CM | POA: Diagnosis not present

## 2022-09-02 ENCOUNTER — Telehealth: Payer: Self-pay | Admitting: Cardiology

## 2022-09-02 DIAGNOSIS — M25412 Effusion, left shoulder: Secondary | ICD-10-CM | POA: Diagnosis not present

## 2022-09-02 DIAGNOSIS — M6281 Muscle weakness (generalized): Secondary | ICD-10-CM | POA: Diagnosis not present

## 2022-09-02 DIAGNOSIS — M25512 Pain in left shoulder: Secondary | ICD-10-CM | POA: Diagnosis not present

## 2022-09-02 DIAGNOSIS — M25612 Stiffness of left shoulder, not elsewhere classified: Secondary | ICD-10-CM | POA: Diagnosis not present

## 2022-09-02 NOTE — Telephone Encounter (Signed)
[name] requested you review as well.  Advised to see PCP as Dr. Bettina Gavia is out of the office and her BP reading is not terrible. [name] per DPR aware and will make an appointment with PCP.

## 2022-09-02 NOTE — Telephone Encounter (Signed)
Pt c/o BP issue: STAT if pt c/o blurred vision, one-sided weakness or slurred speech  1. What are your last 5 BP readings? Friday evening 149/70, Saturday morning 149/70, that night 120/62 normal for her, Sunday morning 135/75, night 130/71  2. Are you having any other symptoms (ex. Dizziness, headache, blurred vision, passed out)? face gets red, just doesn't feels well  3. What is your BP issue? Patients daughter states the patient's BP has been high. She says she will be going to see the patient later today and will check her BP again. She says to call her work number: [phone], and she will be there until 3 pm today.

## 2022-09-13 DIAGNOSIS — N950 Postmenopausal bleeding: Secondary | ICD-10-CM | POA: Insufficient documentation

## 2022-09-13 DIAGNOSIS — N859 Noninflammatory disorder of uterus, unspecified: Secondary | ICD-10-CM | POA: Insufficient documentation

## 2022-09-13 DIAGNOSIS — I10 Essential (primary) hypertension: Secondary | ICD-10-CM | POA: Diagnosis not present

## 2022-09-13 DIAGNOSIS — M8580 Other specified disorders of bone density and structure, unspecified site: Secondary | ICD-10-CM | POA: Insufficient documentation

## 2022-09-13 DIAGNOSIS — Z043 Encounter for examination and observation following other accident: Secondary | ICD-10-CM | POA: Diagnosis not present

## 2022-09-13 DIAGNOSIS — W19XXXA Unspecified fall, initial encounter: Secondary | ICD-10-CM | POA: Diagnosis not present

## 2022-09-13 DIAGNOSIS — M545 Low back pain, unspecified: Secondary | ICD-10-CM | POA: Diagnosis not present

## 2022-09-13 DIAGNOSIS — I959 Hypotension, unspecified: Secondary | ICD-10-CM | POA: Diagnosis not present

## 2022-09-13 DIAGNOSIS — Z743 Need for continuous supervision: Secondary | ICD-10-CM | POA: Diagnosis not present

## 2022-09-13 DIAGNOSIS — R55 Syncope and collapse: Secondary | ICD-10-CM | POA: Diagnosis not present

## 2022-09-13 DIAGNOSIS — M4316 Spondylolisthesis, lumbar region: Secondary | ICD-10-CM | POA: Diagnosis not present

## 2022-09-13 DIAGNOSIS — N841 Polyp of cervix uteri: Secondary | ICD-10-CM | POA: Insufficient documentation

## 2022-09-13 DIAGNOSIS — R1111 Vomiting without nausea: Secondary | ICD-10-CM | POA: Diagnosis not present

## 2022-09-13 DIAGNOSIS — R9431 Abnormal electrocardiogram [ECG] [EKG]: Secondary | ICD-10-CM | POA: Diagnosis not present

## 2022-09-13 DIAGNOSIS — J449 Chronic obstructive pulmonary disease, unspecified: Secondary | ICD-10-CM | POA: Diagnosis not present

## 2022-09-13 DIAGNOSIS — M549 Dorsalgia, unspecified: Secondary | ICD-10-CM | POA: Diagnosis not present

## 2022-09-13 DIAGNOSIS — R52 Pain, unspecified: Secondary | ICD-10-CM | POA: Diagnosis not present

## 2022-09-13 DIAGNOSIS — R319 Hematuria, unspecified: Secondary | ICD-10-CM | POA: Insufficient documentation

## 2022-09-24 DIAGNOSIS — Z6825 Body mass index (BMI) 25.0-25.9, adult: Secondary | ICD-10-CM | POA: Diagnosis not present

## 2022-09-24 DIAGNOSIS — D6869 Other thrombophilia: Secondary | ICD-10-CM | POA: Diagnosis not present

## 2022-09-24 DIAGNOSIS — I9589 Other hypotension: Secondary | ICD-10-CM | POA: Diagnosis not present

## 2022-09-24 DIAGNOSIS — I5020 Unspecified systolic (congestive) heart failure: Secondary | ICD-10-CM | POA: Diagnosis not present

## 2022-09-24 DIAGNOSIS — I480 Paroxysmal atrial fibrillation: Secondary | ICD-10-CM | POA: Diagnosis not present

## 2022-09-24 DIAGNOSIS — I10 Essential (primary) hypertension: Secondary | ICD-10-CM | POA: Diagnosis not present

## 2022-09-24 DIAGNOSIS — I428 Other cardiomyopathies: Secondary | ICD-10-CM | POA: Diagnosis not present

## 2022-09-24 DIAGNOSIS — E663 Overweight: Secondary | ICD-10-CM | POA: Diagnosis not present

## 2022-09-26 DIAGNOSIS — R69 Illness, unspecified: Secondary | ICD-10-CM | POA: Diagnosis not present

## 2022-09-26 DIAGNOSIS — I480 Paroxysmal atrial fibrillation: Secondary | ICD-10-CM | POA: Diagnosis not present

## 2022-10-01 DIAGNOSIS — R69 Illness, unspecified: Secondary | ICD-10-CM | POA: Diagnosis not present

## 2022-10-01 DIAGNOSIS — M25521 Pain in right elbow: Secondary | ICD-10-CM | POA: Diagnosis not present

## 2022-10-01 DIAGNOSIS — M1990 Unspecified osteoarthritis, unspecified site: Secondary | ICD-10-CM | POA: Diagnosis not present

## 2022-10-01 DIAGNOSIS — W19XXXA Unspecified fall, initial encounter: Secondary | ICD-10-CM | POA: Diagnosis not present

## 2022-10-01 DIAGNOSIS — Z043 Encounter for examination and observation following other accident: Secondary | ICD-10-CM | POA: Diagnosis not present

## 2022-10-01 DIAGNOSIS — M25551 Pain in right hip: Secondary | ICD-10-CM | POA: Diagnosis not present

## 2022-10-01 DIAGNOSIS — M4316 Spondylolisthesis, lumbar region: Secondary | ICD-10-CM | POA: Diagnosis not present

## 2022-10-01 DIAGNOSIS — S72001A Fracture of unspecified part of neck of right femur, initial encounter for closed fracture: Secondary | ICD-10-CM | POA: Diagnosis not present

## 2022-10-01 DIAGNOSIS — I959 Hypotension, unspecified: Secondary | ICD-10-CM | POA: Diagnosis not present

## 2022-10-01 DIAGNOSIS — S72011A Unspecified intracapsular fracture of right femur, initial encounter for closed fracture: Secondary | ICD-10-CM | POA: Diagnosis not present

## 2022-10-01 DIAGNOSIS — S0990XA Unspecified injury of head, initial encounter: Secondary | ICD-10-CM | POA: Diagnosis not present

## 2022-10-01 DIAGNOSIS — F32A Depression, unspecified: Secondary | ICD-10-CM | POA: Diagnosis not present

## 2022-10-01 DIAGNOSIS — I4891 Unspecified atrial fibrillation: Secondary | ICD-10-CM | POA: Diagnosis not present

## 2022-10-01 DIAGNOSIS — M47816 Spondylosis without myelopathy or radiculopathy, lumbar region: Secondary | ICD-10-CM | POA: Diagnosis not present

## 2022-10-01 DIAGNOSIS — R2689 Other abnormalities of gait and mobility: Secondary | ICD-10-CM | POA: Diagnosis not present

## 2022-10-01 DIAGNOSIS — Z79899 Other long term (current) drug therapy: Secondary | ICD-10-CM | POA: Diagnosis not present

## 2022-10-01 DIAGNOSIS — F419 Anxiety disorder, unspecified: Secondary | ICD-10-CM | POA: Diagnosis not present

## 2022-10-01 DIAGNOSIS — T68XXXA Hypothermia, initial encounter: Secondary | ICD-10-CM | POA: Diagnosis not present

## 2022-10-01 DIAGNOSIS — R9431 Abnormal electrocardiogram [ECG] [EKG]: Secondary | ICD-10-CM | POA: Diagnosis not present

## 2022-10-01 DIAGNOSIS — K219 Gastro-esophageal reflux disease without esophagitis: Secondary | ICD-10-CM | POA: Diagnosis not present

## 2022-10-01 DIAGNOSIS — S72051A Unspecified fracture of head of right femur, initial encounter for closed fracture: Secondary | ICD-10-CM | POA: Diagnosis not present

## 2022-10-01 DIAGNOSIS — M160 Bilateral primary osteoarthritis of hip: Secondary | ICD-10-CM | POA: Diagnosis not present

## 2022-10-01 DIAGNOSIS — R339 Retention of urine, unspecified: Secondary | ICD-10-CM | POA: Diagnosis not present

## 2022-10-01 DIAGNOSIS — Z743 Need for continuous supervision: Secondary | ICD-10-CM | POA: Diagnosis not present

## 2022-10-01 DIAGNOSIS — M79604 Pain in right leg: Secondary | ICD-10-CM | POA: Diagnosis not present

## 2022-10-01 DIAGNOSIS — Z856 Personal history of leukemia: Secondary | ICD-10-CM | POA: Diagnosis not present

## 2022-10-01 DIAGNOSIS — I10 Essential (primary) hypertension: Secondary | ICD-10-CM | POA: Diagnosis not present

## 2022-10-01 DIAGNOSIS — E7800 Pure hypercholesterolemia, unspecified: Secondary | ICD-10-CM | POA: Diagnosis not present

## 2022-10-01 DIAGNOSIS — Z7901 Long term (current) use of anticoagulants: Secondary | ICD-10-CM | POA: Diagnosis not present

## 2022-10-01 DIAGNOSIS — Z888 Allergy status to other drugs, medicaments and biological substances status: Secondary | ICD-10-CM | POA: Diagnosis not present

## 2022-10-01 DIAGNOSIS — Z9181 History of falling: Secondary | ICD-10-CM | POA: Diagnosis not present

## 2022-10-02 DIAGNOSIS — S72001A Fracture of unspecified part of neck of right femur, initial encounter for closed fracture: Secondary | ICD-10-CM | POA: Diagnosis not present

## 2022-10-02 DIAGNOSIS — I10 Essential (primary) hypertension: Secondary | ICD-10-CM | POA: Diagnosis not present

## 2022-10-05 DIAGNOSIS — I4891 Unspecified atrial fibrillation: Secondary | ICD-10-CM | POA: Diagnosis not present

## 2022-10-07 DIAGNOSIS — E7800 Pure hypercholesterolemia, unspecified: Secondary | ICD-10-CM | POA: Diagnosis not present

## 2022-10-07 DIAGNOSIS — D640 Hereditary sideroblastic anemia: Secondary | ICD-10-CM | POA: Diagnosis not present

## 2022-10-07 DIAGNOSIS — R531 Weakness: Secondary | ICD-10-CM | POA: Diagnosis not present

## 2022-10-07 DIAGNOSIS — E119 Type 2 diabetes mellitus without complications: Secondary | ICD-10-CM | POA: Diagnosis not present

## 2022-10-07 DIAGNOSIS — K219 Gastro-esophageal reflux disease without esophagitis: Secondary | ICD-10-CM | POA: Diagnosis not present

## 2022-10-07 DIAGNOSIS — R488 Other symbolic dysfunctions: Secondary | ICD-10-CM | POA: Diagnosis not present

## 2022-10-07 DIAGNOSIS — M6281 Muscle weakness (generalized): Secondary | ICD-10-CM | POA: Diagnosis not present

## 2022-10-07 DIAGNOSIS — W19XXXA Unspecified fall, initial encounter: Secondary | ICD-10-CM | POA: Diagnosis not present

## 2022-10-07 DIAGNOSIS — I10 Essential (primary) hypertension: Secondary | ICD-10-CM | POA: Diagnosis not present

## 2022-10-07 DIAGNOSIS — S72011A Unspecified intracapsular fracture of right femur, initial encounter for closed fracture: Secondary | ICD-10-CM | POA: Diagnosis not present

## 2022-10-07 DIAGNOSIS — R69 Illness, unspecified: Secondary | ICD-10-CM | POA: Diagnosis not present

## 2022-10-07 DIAGNOSIS — W19XXXS Unspecified fall, sequela: Secondary | ICD-10-CM | POA: Diagnosis not present

## 2022-10-07 DIAGNOSIS — E559 Vitamin D deficiency, unspecified: Secondary | ICD-10-CM | POA: Diagnosis not present

## 2022-10-07 DIAGNOSIS — Z5181 Encounter for therapeutic drug level monitoring: Secondary | ICD-10-CM | POA: Diagnosis not present

## 2022-10-07 DIAGNOSIS — Z741 Need for assistance with personal care: Secondary | ICD-10-CM | POA: Diagnosis not present

## 2022-10-07 DIAGNOSIS — S72001D Fracture of unspecified part of neck of right femur, subsequent encounter for closed fracture with routine healing: Secondary | ICD-10-CM | POA: Diagnosis not present

## 2022-10-07 DIAGNOSIS — W19XXXD Unspecified fall, subsequent encounter: Secondary | ICD-10-CM | POA: Diagnosis not present

## 2022-10-07 DIAGNOSIS — I4891 Unspecified atrial fibrillation: Secondary | ICD-10-CM | POA: Diagnosis not present

## 2022-10-07 DIAGNOSIS — Z7401 Bed confinement status: Secondary | ICD-10-CM | POA: Diagnosis not present

## 2022-10-07 DIAGNOSIS — Z79899 Other long term (current) drug therapy: Secondary | ICD-10-CM | POA: Diagnosis not present

## 2022-10-07 DIAGNOSIS — R2681 Unsteadiness on feet: Secondary | ICD-10-CM | POA: Diagnosis not present

## 2022-10-07 DIAGNOSIS — Z7409 Other reduced mobility: Secondary | ICD-10-CM | POA: Diagnosis not present

## 2022-10-08 DIAGNOSIS — S72001D Fracture of unspecified part of neck of right femur, subsequent encounter for closed fracture with routine healing: Secondary | ICD-10-CM | POA: Diagnosis not present

## 2022-10-08 DIAGNOSIS — R69 Illness, unspecified: Secondary | ICD-10-CM | POA: Diagnosis not present

## 2022-10-08 DIAGNOSIS — I4891 Unspecified atrial fibrillation: Secondary | ICD-10-CM | POA: Diagnosis not present

## 2022-10-09 DIAGNOSIS — I4891 Unspecified atrial fibrillation: Secondary | ICD-10-CM | POA: Diagnosis not present

## 2022-10-09 DIAGNOSIS — I10 Essential (primary) hypertension: Secondary | ICD-10-CM | POA: Diagnosis not present

## 2022-10-09 DIAGNOSIS — E559 Vitamin D deficiency, unspecified: Secondary | ICD-10-CM | POA: Diagnosis not present

## 2022-10-09 DIAGNOSIS — D640 Hereditary sideroblastic anemia: Secondary | ICD-10-CM | POA: Diagnosis not present

## 2022-10-09 DIAGNOSIS — Z79899 Other long term (current) drug therapy: Secondary | ICD-10-CM | POA: Diagnosis not present

## 2022-10-09 DIAGNOSIS — Z5181 Encounter for therapeutic drug level monitoring: Secondary | ICD-10-CM | POA: Diagnosis not present

## 2022-10-09 DIAGNOSIS — E119 Type 2 diabetes mellitus without complications: Secondary | ICD-10-CM | POA: Diagnosis not present

## 2022-10-14 DIAGNOSIS — I4891 Unspecified atrial fibrillation: Secondary | ICD-10-CM | POA: Diagnosis not present

## 2022-10-14 DIAGNOSIS — E559 Vitamin D deficiency, unspecified: Secondary | ICD-10-CM | POA: Diagnosis not present

## 2022-10-14 DIAGNOSIS — S72001D Fracture of unspecified part of neck of right femur, subsequent encounter for closed fracture with routine healing: Secondary | ICD-10-CM | POA: Diagnosis not present

## 2022-10-14 DIAGNOSIS — I10 Essential (primary) hypertension: Secondary | ICD-10-CM | POA: Diagnosis not present

## 2022-10-14 DIAGNOSIS — K219 Gastro-esophageal reflux disease without esophagitis: Secondary | ICD-10-CM | POA: Diagnosis not present

## 2022-10-14 DIAGNOSIS — W19XXXD Unspecified fall, subsequent encounter: Secondary | ICD-10-CM | POA: Diagnosis not present

## 2022-10-15 DIAGNOSIS — I4891 Unspecified atrial fibrillation: Secondary | ICD-10-CM | POA: Diagnosis not present

## 2022-10-16 ENCOUNTER — Ambulatory Visit: Payer: Medicare HMO | Admitting: Cardiology

## 2022-10-24 DIAGNOSIS — E559 Vitamin D deficiency, unspecified: Secondary | ICD-10-CM | POA: Diagnosis not present

## 2022-10-24 DIAGNOSIS — I4891 Unspecified atrial fibrillation: Secondary | ICD-10-CM | POA: Diagnosis not present

## 2022-10-24 DIAGNOSIS — R69 Illness, unspecified: Secondary | ICD-10-CM | POA: Diagnosis not present

## 2022-10-31 DIAGNOSIS — I2510 Atherosclerotic heart disease of native coronary artery without angina pectoris: Secondary | ICD-10-CM | POA: Diagnosis not present

## 2022-10-31 DIAGNOSIS — F411 Generalized anxiety disorder: Secondary | ICD-10-CM | POA: Diagnosis not present

## 2022-10-31 DIAGNOSIS — S72001D Fracture of unspecified part of neck of right femur, subsequent encounter for closed fracture with routine healing: Secondary | ICD-10-CM | POA: Diagnosis not present

## 2022-10-31 DIAGNOSIS — I700 Atherosclerosis of aorta: Secondary | ICD-10-CM | POA: Diagnosis not present

## 2022-10-31 DIAGNOSIS — K219 Gastro-esophageal reflux disease without esophagitis: Secondary | ICD-10-CM | POA: Diagnosis not present

## 2022-10-31 DIAGNOSIS — D6869 Other thrombophilia: Secondary | ICD-10-CM | POA: Diagnosis not present

## 2022-10-31 DIAGNOSIS — D692 Other nonthrombocytopenic purpura: Secondary | ICD-10-CM | POA: Diagnosis not present

## 2022-10-31 DIAGNOSIS — M170 Bilateral primary osteoarthritis of knee: Secondary | ICD-10-CM | POA: Diagnosis not present

## 2022-10-31 DIAGNOSIS — G250 Essential tremor: Secondary | ICD-10-CM | POA: Diagnosis not present

## 2022-10-31 DIAGNOSIS — Z79891 Long term (current) use of opiate analgesic: Secondary | ICD-10-CM | POA: Diagnosis not present

## 2022-10-31 DIAGNOSIS — M48061 Spinal stenosis, lumbar region without neurogenic claudication: Secondary | ICD-10-CM | POA: Diagnosis not present

## 2022-10-31 DIAGNOSIS — I428 Other cardiomyopathies: Secondary | ICD-10-CM | POA: Diagnosis not present

## 2022-10-31 DIAGNOSIS — Z7901 Long term (current) use of anticoagulants: Secondary | ICD-10-CM | POA: Diagnosis not present

## 2022-10-31 DIAGNOSIS — I110 Hypertensive heart disease with heart failure: Secondary | ICD-10-CM | POA: Diagnosis not present

## 2022-10-31 DIAGNOSIS — I480 Paroxysmal atrial fibrillation: Secondary | ICD-10-CM | POA: Diagnosis not present

## 2022-10-31 DIAGNOSIS — Z856 Personal history of leukemia: Secondary | ICD-10-CM | POA: Diagnosis not present

## 2022-10-31 DIAGNOSIS — F331 Major depressive disorder, recurrent, moderate: Secondary | ICD-10-CM | POA: Diagnosis not present

## 2022-10-31 DIAGNOSIS — I9589 Other hypotension: Secondary | ICD-10-CM | POA: Diagnosis not present

## 2022-10-31 DIAGNOSIS — Z96641 Presence of right artificial hip joint: Secondary | ICD-10-CM | POA: Diagnosis not present

## 2022-10-31 DIAGNOSIS — D649 Anemia, unspecified: Secondary | ICD-10-CM | POA: Diagnosis not present

## 2022-10-31 DIAGNOSIS — E559 Vitamin D deficiency, unspecified: Secondary | ICD-10-CM | POA: Diagnosis not present

## 2022-10-31 DIAGNOSIS — M8580 Other specified disorders of bone density and structure, unspecified site: Secondary | ICD-10-CM | POA: Diagnosis not present

## 2022-10-31 DIAGNOSIS — E663 Overweight: Secondary | ICD-10-CM | POA: Diagnosis not present

## 2022-10-31 DIAGNOSIS — I5020 Unspecified systolic (congestive) heart failure: Secondary | ICD-10-CM | POA: Diagnosis not present

## 2022-10-31 DIAGNOSIS — K2960 Other gastritis without bleeding: Secondary | ICD-10-CM | POA: Diagnosis not present

## 2022-11-04 DIAGNOSIS — I428 Other cardiomyopathies: Secondary | ICD-10-CM | POA: Diagnosis not present

## 2022-11-04 DIAGNOSIS — D6869 Other thrombophilia: Secondary | ICD-10-CM | POA: Diagnosis not present

## 2022-11-04 DIAGNOSIS — I10 Essential (primary) hypertension: Secondary | ICD-10-CM | POA: Diagnosis not present

## 2022-11-04 DIAGNOSIS — S72001D Fracture of unspecified part of neck of right femur, subsequent encounter for closed fracture with routine healing: Secondary | ICD-10-CM | POA: Diagnosis not present

## 2022-11-04 DIAGNOSIS — K2960 Other gastritis without bleeding: Secondary | ICD-10-CM | POA: Diagnosis not present

## 2022-11-04 DIAGNOSIS — I480 Paroxysmal atrial fibrillation: Secondary | ICD-10-CM | POA: Diagnosis not present

## 2022-11-04 DIAGNOSIS — M8580 Other specified disorders of bone density and structure, unspecified site: Secondary | ICD-10-CM | POA: Diagnosis not present

## 2022-11-04 DIAGNOSIS — I700 Atherosclerosis of aorta: Secondary | ICD-10-CM | POA: Diagnosis not present

## 2022-11-04 DIAGNOSIS — I5020 Unspecified systolic (congestive) heart failure: Secondary | ICD-10-CM | POA: Diagnosis not present

## 2022-11-04 DIAGNOSIS — I25119 Atherosclerotic heart disease of native coronary artery with unspecified angina pectoris: Secondary | ICD-10-CM | POA: Diagnosis not present

## 2022-11-04 DIAGNOSIS — F411 Generalized anxiety disorder: Secondary | ICD-10-CM | POA: Diagnosis not present

## 2022-11-04 DIAGNOSIS — E663 Overweight: Secondary | ICD-10-CM | POA: Diagnosis not present

## 2022-11-13 DIAGNOSIS — R197 Diarrhea, unspecified: Secondary | ICD-10-CM | POA: Diagnosis not present

## 2022-11-13 DIAGNOSIS — A09 Infectious gastroenteritis and colitis, unspecified: Secondary | ICD-10-CM | POA: Diagnosis not present

## 2022-11-19 DIAGNOSIS — D6869 Other thrombophilia: Secondary | ICD-10-CM | POA: Diagnosis not present

## 2022-11-19 DIAGNOSIS — I4891 Unspecified atrial fibrillation: Secondary | ICD-10-CM | POA: Diagnosis not present

## 2022-11-19 DIAGNOSIS — I2510 Atherosclerotic heart disease of native coronary artery without angina pectoris: Secondary | ICD-10-CM | POA: Diagnosis not present

## 2022-11-19 DIAGNOSIS — R69 Illness, unspecified: Secondary | ICD-10-CM | POA: Diagnosis not present

## 2022-11-19 DIAGNOSIS — I429 Cardiomyopathy, unspecified: Secondary | ICD-10-CM | POA: Diagnosis not present

## 2022-11-19 DIAGNOSIS — I110 Hypertensive heart disease with heart failure: Secondary | ICD-10-CM | POA: Diagnosis not present

## 2022-11-19 DIAGNOSIS — K219 Gastro-esophageal reflux disease without esophagitis: Secondary | ICD-10-CM | POA: Diagnosis not present

## 2022-11-19 DIAGNOSIS — Z8249 Family history of ischemic heart disease and other diseases of the circulatory system: Secondary | ICD-10-CM | POA: Diagnosis not present

## 2022-11-19 DIAGNOSIS — Z9181 History of falling: Secondary | ICD-10-CM | POA: Diagnosis not present

## 2022-11-19 DIAGNOSIS — Z008 Encounter for other general examination: Secondary | ICD-10-CM | POA: Diagnosis not present

## 2022-11-19 DIAGNOSIS — R2681 Unsteadiness on feet: Secondary | ICD-10-CM | POA: Diagnosis not present

## 2022-11-19 DIAGNOSIS — M1990 Unspecified osteoarthritis, unspecified site: Secondary | ICD-10-CM | POA: Diagnosis not present

## 2022-11-28 DIAGNOSIS — M170 Bilateral primary osteoarthritis of knee: Secondary | ICD-10-CM | POA: Diagnosis not present

## 2022-12-05 DIAGNOSIS — I5020 Unspecified systolic (congestive) heart failure: Secondary | ICD-10-CM | POA: Diagnosis not present

## 2022-12-05 DIAGNOSIS — S72001D Fracture of unspecified part of neck of right femur, subsequent encounter for closed fracture with routine healing: Secondary | ICD-10-CM | POA: Diagnosis not present

## 2022-12-05 DIAGNOSIS — I110 Hypertensive heart disease with heart failure: Secondary | ICD-10-CM | POA: Diagnosis not present

## 2022-12-16 DIAGNOSIS — C519 Malignant neoplasm of vulva, unspecified: Secondary | ICD-10-CM | POA: Diagnosis not present

## 2022-12-22 NOTE — Progress Notes (Deleted)
Cardiology Office Note:    Date:  12/22/2022   ID:  [name], [name] Feb 11, 1941, MRN [id number]  PCP:  Street, [name], MD  Cardiologist:  Shirlee More, MD    Referring MD: [address], [name], *    ASSESSMENT:    No diagnosis found. PLAN:    In order of problems listed above:  ***   Next appointment: ***   Medication Adjustments/Labs and Tests Ordered: Current medicines are reviewed at length with the patient today.  Concerns regarding medicines are outlined above.  No orders of the defined types were placed in this encounter.  No orders of the defined types were placed in this encounter.   No chief complaint on file.   History of Present Illness:    [name] is a 82 y.o. female with a hx of paroxysmal atrial fibrillation not on antiarrhythmic drug with chronic anticoagulation heart failure with tachycardia induced cardiomyopathy intolerant of Entresto with hypotension mild CAD and hyperlipidemia last seen 03/11/2022.  Ejection fraction 02/18/2022 with mildly reduced 45 to 50%. Compliance with diet, lifestyle and medications: *** Past Medical History:  Diagnosis Date   AML (acute myelogenous leukemia) ([place])    Anxiety    Atrial fibrillation ([facility])    Barrett's esophagus    Basal cell carcinoma of vulva ([facility])    Depression    Endometrial polyp    "polyps" per intake sheet   GERD (gastroesophageal reflux disease)    Hemorrhoids    Hypertension    Spinal stenosis    Weakness     Past Surgical History:  Procedure Laterality Date   CATARACT EXTRACTION, BILATERAL     august/september 2017   cataract surgery     CERVICAL POLYPECTOMY N/A 08/14/2015   Procedure: CERVICAL POLYPECTOMY;  Surgeon: Newton Pigg, MD;  Location: [facility];  Service: Gynecology;  Laterality: N/A;   CHOLECYSTECTOMY     endometrial polyp surgery     HEMORROIDECTOMY     HYSTEROSCOPY WITH D & C N/A 08/14/2015   Procedure: DILATATION AND CURETTAGE /HYSTEROSCOPY;  Surgeon:  Newton Pigg, MD;  Location: [facility];  Service: Gynecology;  Laterality: N/A;   KNEE SURGERY Right    PORTACATH PLACEMENT     RIGHT/LEFT HEART CATH AND CORONARY ANGIOGRAPHY N/A 10/24/2021   Procedure: RIGHT/LEFT HEART CATH AND CORONARY ANGIOGRAPHY;  Surgeon: Sherren Mocha, MD;  Location: [facility];  Service: Cardiovascular;  Laterality: N/A;   TUNNELED VENOUS CATHETER PLACEMENT      Current Medications: No outpatient medications have been marked as taking for the 12/24/22 encounter (Appointment) with Richardo Priest, MD.     Allergies:   Delene Loll [sacubitril-valsartan], Compazine [prochlorperazine edisylate], Cortisone, Eliquis [apixaban], Prednisone, and Prochlorperazine   Social History   Socioeconomic History   Marital status: Married    Spouse name: Eddy Jairam   Number of children: 1   Years of education: Not on file   Highest education level: High school graduate  Occupational History   Occupation: retired  Tobacco Use   Smoking status: Never    Passive exposure: Never   Smokeless tobacco: Never  Vaping Use   Vaping Use: Never used  Substance and Sexual Activity   Alcohol use: No   Drug use: No   Sexual activity: Not on file  Other Topics Concern   Not on file  Social History Narrative   Lives at home with husband   Right handed   Caffeine: quit years ago   Social Determinants of  Health   Financial Resource Strain: Low Risk  (10/26/2021)   Overall Financial Resource Strain (CARDIA)    Difficulty of Paying Living Expenses: Not hard at all  Food Insecurity: No Food Insecurity (10/26/2021)   Hunger Vital Sign    Worried About Running Out of Food in the Last Year: Never true    Ran Out of Food in the Last Year: Never true  Transportation Needs: No Transportation Needs (10/26/2021)   PRAPARE - Hydrologist (Medical): No    Lack of Transportation (Non-Medical): No  Physical Activity: Not on file  Stress: Not on file   Social Connections: Not on file     Family History: The patient's ***family history includes Alzheimer's disease in her brother; Breast cancer in her paternal aunt; CVA in her mother; Diabetes in her mother; Heart attack in her brother and mother; Heart disease in her brother and mother; Lymphoma in her father; Non-Hodgkin's lymphoma in her father. There is no history of Colon cancer. ROS:   Please see the history of present illness.    All other systems reviewed and are negative.  EKGs/Labs/Other Studies Reviewed:    The following studies were reviewed today:  EKG:  EKG ordered today and personally reviewed.  The ekg ordered today demonstrates ***  Recent Labs: No results found for requested labs within last 365 days.  Recent Lipid Panel No results found for: "CHOL", "TRIG", "HDL", "CHOLHDL", "VLDL", "LDLCALC", "LDLDIRECT"  Physical Exam:    VS:  There were no vitals taken for this visit.    Wt Readings from Last 3 Encounters:  03/11/22 165 lb 6.4 oz (75 kg)  12/14/21 164 lb (74.4 kg)  11/26/21 162 lb 6.4 oz (73.7 kg)     GEN: *** Well nourished, well developed in no acute distress HEENT: Normal NECK: No JVD; No carotid bruits LYMPHATICS: No lymphadenopathy CARDIAC: ***RRR, no murmurs, rubs, gallops RESPIRATORY:  Clear to auscultation without rales, wheezing or rhonchi  ABDOMEN: Soft, non-tender, non-distended MUSCULOSKELETAL:  No edema; No deformity  SKIN: Warm and dry NEUROLOGIC:  Alert and oriented x 3 PSYCHIATRIC:  Normal affect    Signed, Shirlee More, MD  12/22/2022 11:28 AM    [facility]

## 2022-12-24 ENCOUNTER — Ambulatory Visit: Payer: Medicare HMO | Admitting: Cardiology

## 2022-12-24 DIAGNOSIS — I480 Paroxysmal atrial fibrillation: Secondary | ICD-10-CM

## 2022-12-24 DIAGNOSIS — I2510 Atherosclerotic heart disease of native coronary artery without angina pectoris: Secondary | ICD-10-CM

## 2022-12-24 DIAGNOSIS — I5042 Chronic combined systolic (congestive) and diastolic (congestive) heart failure: Secondary | ICD-10-CM

## 2022-12-24 DIAGNOSIS — Z7901 Long term (current) use of anticoagulants: Secondary | ICD-10-CM

## 2023-02-04 ENCOUNTER — Other Ambulatory Visit: Payer: Self-pay | Admitting: Cardiology

## 2023-02-16 NOTE — Progress Notes (Signed)
 " Cardiology Office Note:    Date:  02/17/2023   ID:  [name], [name] [birth year], MRN [id number]  PCP:  Street, Lonni HERO, MD   [facility] Providers Cardiologist:  Redell Leiter, MD     Referring MD: Street, Lonni HERO, *   Chief Complaint  Patient presents with   Annual Exam    History of Present Illness:    [name] is a 82 y.o. female with a hx of non-obstructive CAD, hypertension, atrial fibrillation on Xarelto , NICM felt to be secondary to tachyarrythmia, systolic heart failure, GERD, Barrett's esophagus, depression, anxiety, AML.  [facility] 10/24/2021 revealed mild diffuse nonobstructive coronary artery disease, mid LAD lesion 40% stenosed.  Noted to have NICM felt to be tachycardia mediated.  Most recently she was evaluated by Dr. Leiter on 03/11/2022, at that time she was doing well with no recurrence of atrial fibrillation. Echo in May 2023 revealed an EF 50 to 55%, impaired relaxation, mild aortic sclerosis without stenosis, trace MR.  She was admitted to [facility] on 10/01/2022 to 10/07/2022 s/p fall resulting in closed right hip fracture.  She developed A-fib with RVR in the postoperative.  However with beta-blocker and digoxin  her rates improved.  She presents today accompanied by her daughter for routine follow-up.  She is feeling well from a cardiac perspective.  She has finished with PT/OT, is able to ambulate with assistance in the home.  EKG today reveals she is in atrial fibrillation, she is unaware of this.  Upon review of her most recent records from [facility], she was in atrial fibrillation at discharge as well.  She denies chest pain, palpitations, dyspnea, pnd, orthopnea, n, v, dizziness, syncope, edema, weight gain, or early satiety.   Past Medical History:  Diagnosis Date   AML (acute myelogenous leukemia)    Anxiety    Atrial fibrillation    Barrett's esophagus    Basal cell carcinoma of vulva    Depression     Endometrial polyp    polyps per intake sheet   GERD (gastroesophageal reflux disease)    Hemorrhoids    Hypertension    Spinal stenosis    Weakness     Past Surgical History:  Procedure Laterality Date   CATARACT EXTRACTION, BILATERAL     august/september 2017   cataract surgery     CERVICAL POLYPECTOMY N/A 08/14/2015   Procedure: CERVICAL POLYPECTOMY;  Surgeon: Debby Lares, MD;  Location: [facility];  Service: Gynecology;  Laterality: N/A;   CHOLECYSTECTOMY     endometrial polyp surgery     HEMORROIDECTOMY     HYSTEROSCOPY WITH D & C N/A 08/14/2015   Procedure: DILATATION AND CURETTAGE /HYSTEROSCOPY;  Surgeon: Debby Lares, MD;  Location: [facility];  Service: Gynecology;  Laterality: N/A;   KNEE SURGERY Right    PORTACATH PLACEMENT     RIGHT/LEFT HEART CATH AND CORONARY ANGIOGRAPHY N/A 10/24/2021   Procedure: RIGHT/LEFT HEART CATH AND CORONARY ANGIOGRAPHY;  Surgeon: Wonda Sharper, MD;  Location: [facility] INVASIVE CV LAB;  Service: Cardiovascular;  Laterality: N/A;   TUNNELED VENOUS CATHETER PLACEMENT      Current Medications: Current Meds  Medication Sig   Cholecalciferol  (VITAMIN D ) 125 MCG (5000 UT) CAPS Take 5,000 Units by mouth daily.   diazepam  (VALIUM ) 10 MG tablet Take 10 mg by mouth 2 (two) times daily. Takes an additional tablet if needed in the afternoon   digoxin  (LANOXIN ) 0.125 MG tablet Take 125 mcg by mouth daily.   famotidine  (  PEPCID ) 40 MG tablet Take 40 mg by mouth daily.   furosemide  (LASIX ) 20 MG tablet Take 20 mg by mouth as needed for fluid or edema.   labetalol  (NORMODYNE ) 100 MG tablet Take 100 mg by mouth 2 (two) times daily.   pantoprazole  (PROTONIX ) 40 MG tablet Take 1 tablet (40 mg total) by mouth daily.   rivaroxaban  (XARELTO ) 20 MG TABS tablet Take 1 tablet (20 mg total) by mouth daily with supper.   venlafaxine  XR (EFFEXOR  XR) 37.5 MG 24 hr capsule Take 1 capsule (37.5 mg total) by mouth daily with breakfast. Please follow-up with PCP for further  refills.   venlafaxine  XR (EFFEXOR -XR) 150 MG 24 hr capsule Take 150 mg by mouth daily.   vitamin B-12 (CYANOCOBALAMIN ) 1000 MCG tablet Take 1,000 mcg by mouth daily.   Zinc 50 MG TABS Take 50 mg by mouth daily.     Allergies:   Entresto [sacubitril-valsartan], Compazine [prochlorperazine edisylate], Cortisone, Eliquis [apixaban], Prednisone, and Prochlorperazine   Social History   Socioeconomic History   Marital status: Married    Spouse name: Jazmene Racz   Number of children: 1   Years of education: Not on file   Highest education level: High school graduate  Occupational History   Occupation: retired  Tobacco Use   Smoking status: Never    Passive exposure: Never   Smokeless tobacco: Never  Vaping Use   Vaping Use: Never used  Substance and Sexual Activity   Alcohol  use: No   Drug use: No   Sexual activity: Not on file  Other Topics Concern   Not on file  Social History Narrative   Lives at home with husband   Right handed   Caffeine: quit years ago   Social Determinants of Health   Financial Resource Strain: Low Risk  (10/26/2021)   Overall Financial Resource Strain (CARDIA)    Difficulty of Paying Living Expenses: Not hard at all  Food Insecurity: No Food Insecurity (10/26/2021)   Hunger Vital Sign    Worried About Running Out of Food in the Last Year: Never true    Ran Out of Food in the Last Year: Never true  Transportation Needs: No Transportation Needs (10/26/2021)   PRAPARE - Administrator, Civil Service (Medical): No    Lack of Transportation (Non-Medical): No  Physical Activity: Not on file  Stress: Not on file  Social Connections: Not on file     Family History: The patient's family history includes Alzheimer's disease in her brother; Breast cancer in her paternal aunt; CVA in her mother; Diabetes in her mother; Heart attack in her brother and mother; Heart disease in her brother and mother; Lymphoma in her father; Non-Hodgkin's lymphoma  in her father. There is no history of Colon cancer.  ROS:   Please see the history of present illness.     All other systems reviewed and are negative.  EKGs/Labs/Other Studies Reviewed:    The following studies were reviewed today: Cardiac Studies & Procedures   CARDIAC CATHETERIZATION  CARDIAC CATHETERIZATION 10/24/2021  Narrative   Mid LAD lesion is 40% stenosed.  1.  Mild diffuse nonobstructive coronary plaquing. 2.  Essentially normal intracardiac filling pressures with wedge pressure of 13 mmHg, PA pressure 25/9 with a mean of 16 mmHg, and preserved cardiac output of 7.4 L/min  Recommend: Patient appears to have nonischemic cardiomyopathy, likely tachycardia-mediated. Patient remains in atrial fibrillation with RVR.  I do not think her blood pressure will tolerate aggressive  beta-blocker titration.  Calcium  channel blockers are contraindicated in the setting of her severe cardiomyopathy.  Would start IV amiodarone  and consider TEE/cardioversion per primary team.  We will start on IV heparin  post procedure with plans to transition to oral anticoagulation per primary team.  Findings Coronary Findings Diagnostic  Dominance: Right  Left Main The vessel exhibits minimal luminal irregularities.  Left Anterior Descending There is mild diffuse disease throughout the vessel. Mild diffuse plaquing in the LAD, focal 40 to 50% lesion in the mid LAD noted.  There is no obstructive disease throughout the LAD or its diagonal branches. Mid LAD lesion is 40% stenosed.  Left Circumflex The vessel exhibits minimal luminal irregularities.  Right Coronary Artery There is mild diffuse disease throughout the vessel. Large, dominant RCA.  Diffuse irregularity throughout.  The PDA and PLA branches are patent with no significant stenoses.  Intervention  No interventions have been documented.   STRESS TESTS  MYOCARDIAL PERFUSION IMAGING 02/08/2016  Narrative  Nuclear stress EF: 62%.   There was no ST segment deviation noted during stress.  The study is normal. no ecvidence of ischemia.  This is a low risk study.   ECHOCARDIOGRAM  ECHOCARDIOGRAM COMPLETE 02/18/2022  Narrative ECHOCARDIOGRAM REPORT    Patient Name:   MARLENE BEIDLER Date of Exam: 02/18/2022 Medical Rec #:  [id number]      Height:       68.0 in Accession #:    [id number]     Weight:       164.0 lb Date of Birth:  [birth year]      BSA:          1.878 m Patient Age:    80 years       BP:           120/76 mmHg Patient Gender: F              HR:           69 bpm. Exam Location:  [facility]  Procedure: 2D Echo, Cardiac Doppler and Color Doppler  Indications:    Dyspnea R06.00  History:        Patient has prior history of Echocardiogram examinations, most recent 10/24/2021. Arrythmias:Atrial Fibrillation, Signs/Symptoms:Fatigue; Risk Factors:Hypertension.  Sonographer:    Lynwood Silvas RDCS Referring Phys: [id number] BRIAN J MUNLEY  IMPRESSIONS   1. Left ventricular ejection fraction, by estimation, is 45 to 50%. The left ventricle has mildly decreased function. The left ventricle has no regional wall motion abnormalities. There is mild concentric left ventricular hypertrophy. Left ventricular diastolic parameters are consistent with Grade I diastolic dysfunction (impaired relaxation). 2. Right ventricular systolic function is normal. The right ventricular size is normal. There is normal pulmonary artery systolic pressure. 3. The mitral valve is normal in structure. No evidence of mitral valve regurgitation. No evidence of mitral stenosis. 4. The aortic valve is tricuspid. Aortic valve regurgitation is mild. Aortic valve sclerosis/calcification is present, without any evidence of aortic stenosis. 5. The inferior vena cava is normal in size with greater than 50% respiratory variability, suggesting right atrial pressure of 3 mmHg.  FINDINGS Left Ventricle: Left ventricular ejection fraction, by estimation, is  45 to 50%. The left ventricle has mildly decreased function. The left ventricle has no regional wall motion abnormalities. Global longitudinal strain performed but not reported based on interpreter judgement due to suboptimal tracking. The left ventricular internal cavity size was normal in size. There is mild concentric left ventricular hypertrophy. Left ventricular diastolic parameters are  consistent with Grade I diastolic dysfunction (impaired relaxation). Indeterminate filling pressures.  Right Ventricle: The right ventricular size is normal. No increase in right ventricular wall thickness. Right ventricular systolic function is normal. There is normal pulmonary artery systolic pressure. The tricuspid regurgitant velocity is 2.04 m/s, and with an assumed right atrial pressure of 3 mmHg, the estimated right ventricular systolic pressure is 19.6 mmHg.  Left Atrium: Left atrial size was normal in size.  Right Atrium: Right atrial size was normal in size.  Pericardium: There is no evidence of pericardial effusion.  Mitral Valve: The mitral valve is normal in structure. No evidence of mitral valve regurgitation. No evidence of mitral valve stenosis.  Tricuspid Valve: The tricuspid valve is normal in structure. Tricuspid valve regurgitation is trivial. No evidence of tricuspid stenosis.  Aortic Valve: The aortic valve is tricuspid. Aortic valve regurgitation is mild. Aortic regurgitation PHT measures 565 msec. Aortic valve sclerosis/calcification is present, without any evidence of aortic stenosis.  Pulmonic Valve: The pulmonic valve was normal in structure. Pulmonic valve regurgitation is not visualized. No evidence of pulmonic stenosis.  Aorta: The aortic root and ascending aorta are structurally normal, with no evidence of dilitation and the aortic arch was not well visualized.  Venous: The pulmonary veins were not well visualized. The inferior vena cava is normal in size with greater than  50% respiratory variability, suggesting right atrial pressure of 3 mmHg.  IAS/Shunts: No atrial level shunt detected by color flow Doppler.   LEFT VENTRICLE PLAX 2D LVIDd:         3.80 cm     Diastology LVIDs:         2.90 cm     LV e' medial:    4.43 cm/s LV PW:         1.10 cm     LV E/e' medial:  15.7 LV IVS:        1.20 cm     LV e' lateral:   6.38 cm/s LVOT diam:     2.00 cm     LV E/e' lateral: 10.9 LV SV:         67 LV SV Index:   35 LVOT Area:     3.14 cm  LV Volumes (MOD) LV vol d, MOD A2C: 55.6 ml LV vol d, MOD A4C: 43.6 ml LV vol s, MOD A2C: 31.5 ml LV vol s, MOD A4C: 21.3 ml LV SV MOD A2C:     24.1 ml LV SV MOD A4C:     43.6 ml LV SV MOD BP:      22.0 ml  RIGHT VENTRICLE            IVC RV S prime:     9.67 cm/s  IVC diam: 1.60 cm TAPSE (M-mode): 1.9 cm  LEFT ATRIUM             Index        RIGHT ATRIUM           Index LA diam:        3.50 cm 1.86 cm/m   RA Area:     16.40 cm LA Vol (A2C):   38.1 ml 20.28 ml/m  RA Volume:   43.00 ml  22.89 ml/m LA Vol (A4C):   36.9 ml 19.64 ml/m LA Biplane Vol: 39.3 ml 20.92 ml/m AORTIC VALVE LVOT Vmax:   74.70 cm/s LVOT Vmean:  53.500 cm/s LVOT VTI:    0.212 m AI PHT:  565 msec  AORTA Ao Root diam: 3.10 cm Ao Asc diam:  3.30 cm Ao Desc diam: 2.00 cm  MITRAL VALVE               TRICUSPID VALVE MV Area (PHT): 2.34 cm    TR Peak grad:   16.6 mmHg MV Decel Time: 324 msec    TR Vmax:        204.00 cm/s MV E velocity: 69.40 cm/s MV A velocity: 81.80 cm/s  SHUNTS MV E/A ratio:  0.85        Systemic VTI:  0.21 m Systemic Diam: 2.00 cm  Redell Leiter MD Electronically signed by Redell Leiter MD Signature Date/Time: 02/18/2022/12:09:24 PM    Final    MONITORS  CARDIAC EVENT MONITOR 03/03/2016  Narrative  Normal Sinus Rhythm and sinus tachycardia up to 100bpm  Occasional PACs and atrial triplet  nonsustained atrial tachycardia up to 13 beats            EKG:  EKG is  ordered today.  The ekg ordered  today demonstrates Atrial fibrillation, HR 63 bpm.   Recent Labs: No results found for requested labs within last 365 days.  Recent Lipid Panel No results found for: CHOL, TRIG, HDL, CHOLHDL, VLDL, LDLCALC, LDLDIRECT   Risk Assessment/Calculations:    CHA2DS2-VASc Score = 6   This indicates a 9.7% annual risk of stroke. The patient's score is based upon: CHF History: 1 HTN History: 1 Diabetes History: 0 Stroke History: 0 Vascular Disease History: 1 Age Score: 2 Gender Score: 1               Physical Exam:    VS:  BP 118/70 (BP Location: Left Arm, Patient Position: Sitting, Cuff Size: Normal)   Pulse 63   Ht 5' 8 (1.727 m)   Wt 154 lb (69.9 kg) Comment: Verbal  SpO2 96%   BMI 23.42 kg/m     Wt Readings from Last 3 Encounters:  02/17/23 154 lb (69.9 kg)  03/11/22 165 lb 6.4 oz (75 kg)  12/14/21 164 lb (74.4 kg)     GEN:  Well nourished, well developed in no acute distress HEENT: Normal NECK: No JVD; No carotid bruits LYMPHATICS: No lymphadenopathy CARDIAC: irregular rhythm, no murmurs, rubs, gallops RESPIRATORY:  Clear to auscultation without rales, wheezing or rhonchi  ABDOMEN: Soft, non-tender, non-distended MUSCULOSKELETAL:  No edema; No deformity  SKIN: Warm and dry NEUROLOGIC:  Alert and oriented x 3 PSYCHIATRIC:  Normal affect   ASSESSMENT:    1. PAF (paroxysmal atrial fibrillation)   2. Chronic anticoagulation   3. Chronic combined systolic and diastolic heart failure   4. Mild CAD   5. NICM (nonischemic cardiomyopathy)   6. Essential hypertension    PLAN:    In order of problems listed above:   PAF/chronic anticoagulation - EKG reveals rate controlled atrial fibrillation today, appears that she may have been in AF since her d/c from the hospital in December. She is asymptomatic. CHADSVasc score of 7.  Continue Xarelto  20 mg daily--currently no indication for reduced dosing CrCl 100, labetalol  100 mg twice a day, continue digoxin   125 mcg every day.  Will repeat digoxin  level, CMET, CBC. 14 day monitor to assess for AF burden and tachyarrhythmias.   CAD -mild diffuse nonobstructive coronary plaquing on [facility] December 2022, Stable with no anginal symptoms. No indication for ischemic evaluation.  Continue labetalol  100 mg twice a day, currently anticoagulated on Xarelto .  NICM/HFrEF with improved EF -most  recent echo in May 2023 revealed EF 50 to 55%, NYHA class II--likely d/t deconditioning, euvolemic.  Continue labetalol  per above, has Lasix  as needed for weight gain, uses approximately 1 time per month.  HTN -blood pressure today is well-controlled 118/70, continue labetalol  100 mg twice a day.     Disposition - CBC, CMET, digoxin  level. 14 day zio. Return in 6 weeks.         Medication Adjustments/Labs and Tests Ordered: Current medicines are reviewed at length with the patient today.  Concerns regarding medicines are outlined above.  Orders Placed This Encounter  Procedures   Comprehensive metabolic panel   CBC with Differential/Platelet   TSH   Digoxin  level   LONG TERM MONITOR (3-14 DAYS)   EKG 12-Lead   No orders of the defined types were placed in this encounter.   Patient Instructions  Medication Instructions:  Your physician recommends that you continue on your current medications as directed. Please refer to the Current Medication list given to you today.  *If you need a refill on your cardiac medications before your next appointment, please call your pharmacy*   Lab Work: Your physician recommends that you return for lab work in: Next few days for a CMP, CBC, TSH and Digoxin  Level. Make sure you do not take your digoxin  the day you come in for labs Lab opens at 8am. You DO NOT NEED an appointment. Best time to come is between 8am and 12noon and between 1:30 and 4:30. If you have been asked to fast for your blood work please have nothing to eat or drink after midnight. You may have water.   If  you have labs (blood work) drawn today and your tests are completely normal, you will receive your results only by: MyChart Message (if you have MyChart) OR A paper copy in the mail If you have any lab test that is abnormal or we need to change your treatment, we will call you to review the results.   Testing/Procedures: You have been asked to wear a Zio Heart Monitor today. It is to be worn for 14 days. Please remove the monitor on 5/6  and mail back in the box provided.  If you have any questions about the monitor please call the company at [phone]     Follow-Up: At [facility], you and your health needs are our priority.  As part of our continuing mission to provide you with exceptional heart care, we have created designated Provider Care Teams.  These Care Teams include your primary Cardiologist (physician) and Advanced Practice Providers (APPs -  Physician Assistants and Nurse Practitioners) who all work together to provide you with the care you need, when you need it.  We recommend signing up for the patient portal called MyChart.  Sign up information is provided on this After Visit Summary.  MyChart is used to connect with patients for Virtual Visits (Telemedicine).  Patients are able to view lab/test results, encounter notes, upcoming appointments, etc.  Non-urgent messages can be sent to your provider as well.   To learn more about what you can do with MyChart, go to [email or web address].    Your next appointment:   6 Weeks  Provider:   Delon Hoover, NP [facility]) or [facility]   Other Instructions     Signed, Delon JAYSON Hoover, NP  02/17/2023 4:56 PM    [facility] "

## 2023-02-17 ENCOUNTER — Ambulatory Visit: Payer: Medicare HMO | Admitting: Cardiology

## 2023-02-17 ENCOUNTER — Encounter: Payer: Self-pay | Admitting: Cardiology

## 2023-02-17 ENCOUNTER — Ambulatory Visit: Payer: Medicare HMO | Attending: Cardiology | Admitting: Cardiology

## 2023-02-17 ENCOUNTER — Ambulatory Visit: Payer: Medicare HMO | Attending: Cardiology

## 2023-02-17 VITALS — BP 118/70 | HR 63 | Ht 68.0 in | Wt 154.0 lb

## 2023-02-17 DIAGNOSIS — I480 Paroxysmal atrial fibrillation: Secondary | ICD-10-CM | POA: Diagnosis not present

## 2023-02-17 DIAGNOSIS — Z7901 Long term (current) use of anticoagulants: Secondary | ICD-10-CM

## 2023-02-17 DIAGNOSIS — I10 Essential (primary) hypertension: Secondary | ICD-10-CM

## 2023-02-17 DIAGNOSIS — I2510 Atherosclerotic heart disease of native coronary artery without angina pectoris: Secondary | ICD-10-CM

## 2023-02-17 DIAGNOSIS — I5042 Chronic combined systolic (congestive) and diastolic (congestive) heart failure: Secondary | ICD-10-CM

## 2023-02-17 DIAGNOSIS — I428 Other cardiomyopathies: Secondary | ICD-10-CM

## 2023-02-17 NOTE — Patient Instructions (Signed)
Medication Instructions:  Your physician recommends that you continue on your current medications as directed. Please refer to the Current Medication list given to you today.  *If you need a refill on your cardiac medications before your next appointment, please call your pharmacy*   Lab Work: Your physician recommends that you return for lab work in: Next few days for a CMP, CBC, TSH and Digoxin Level. Make sure you do not take your digoxin the day you come in for labs Lab opens at 8am. You DO NOT NEED an appointment. Best time to come is between 8am and 12noon and between 1:30 and 4:30. If you have been asked to fast for your blood work please have nothing to eat or drink after midnight. You may have water.   If you have labs (blood work) drawn today and your tests are completely normal, you will receive your results only by: MyChart Message (if you have MyChart) OR A paper copy in the mail If you have any lab test that is abnormal or we need to change your treatment, we will call you to review the results.   Testing/Procedures: You have been asked to wear a Zio Heart Monitor today. It is to be worn for 14 days. Please remove the monitor on 5/6  and mail back in the box provided.  If you have any questions about the monitor please call the company at [phone]     Follow-Up: At [facility], you and your health needs are our priority.  As part of our continuing mission to provide you with exceptional heart care, we have created designated Provider Care Teams.  These Care Teams include your primary Cardiologist (physician) and Advanced Practice Providers (APPs -  Physician Assistants and Nurse Practitioners) who all work together to provide you with the care you need, when you need it.  We recommend signing up for the patient portal called "MyChart".  Sign up information is provided on this After Visit Summary.  MyChart is used to connect with patients for Virtual Visits  (Telemedicine).  Patients are able to view lab/test results, encounter notes, upcoming appointments, etc.  Non-urgent messages can be sent to your provider as well.   To learn more about what you can do with MyChart, go to [email or web address].    Your next appointment:   6 Weeks  Provider:   Wallis Bamberg, NP ([facility]) or [facility]   Other Instructions

## 2023-02-28 ENCOUNTER — Telehealth: Payer: Self-pay | Admitting: *Deleted

## 2023-02-28 LAB — COMPREHENSIVE METABOLIC PANEL WITH GFR
ALT: 11 IU/L (ref 0–32)
AST: 11 IU/L (ref 0–40)
Albumin/Globulin Ratio: 2.1 (ref 1.2–2.2)
Albumin: 4 g/dL (ref 3.7–4.7)
Alkaline Phosphatase: 103 IU/L (ref 44–121)
BUN/Creatinine Ratio: 11 — ABNORMAL LOW (ref 12–28)
BUN: 11 mg/dL (ref 8–27)
Bilirubin Total: 0.5 mg/dL (ref 0.0–1.2)
CO2: 21 mmol/L (ref 20–29)
Calcium: 9.7 mg/dL (ref 8.7–10.3)
Chloride: 105 mmol/L (ref 96–106)
Creatinine, Ser: 0.96 mg/dL (ref 0.57–1.00)
Globulin, Total: 1.9 g/dL (ref 1.5–4.5)
Glucose: 110 mg/dL — ABNORMAL HIGH (ref 70–99)
Potassium: 4 mmol/L (ref 3.5–5.2)
Sodium: 143 mmol/L (ref 134–144)
Total Protein: 5.9 g/dL — ABNORMAL LOW (ref 6.0–8.5)
eGFR: 59 mL/min/1.73 — ABNORMAL LOW

## 2023-02-28 LAB — CBC WITH DIFFERENTIAL/PLATELET
Basophils Absolute: 0.1 x10E3/uL (ref 0.0–0.2)
Basos: 1 %
EOS (ABSOLUTE): 0.3 x10E3/uL (ref 0.0–0.4)
Eos: 5 %
Hematocrit: 36.6 % (ref 34.0–46.6)
Hemoglobin: 11.6 g/dL (ref 11.1–15.9)
Immature Grans (Abs): 0.1 x10E3/uL (ref 0.0–0.1)
Immature Granulocytes: 1 %
Lymphocytes Absolute: 1.9 x10E3/uL (ref 0.7–3.1)
Lymphs: 27 %
MCH: 27.5 pg (ref 26.6–33.0)
MCHC: 31.7 g/dL (ref 31.5–35.7)
MCV: 87 fL (ref 79–97)
Monocytes Absolute: 0.5 x10E3/uL (ref 0.1–0.9)
Monocytes: 8 %
Neutrophils Absolute: 4.2 x10E3/uL (ref 1.4–7.0)
Neutrophils: 58 %
Platelets: 231 x10E3/uL (ref 150–450)
RBC: 4.22 x10E6/uL (ref 3.77–5.28)
RDW: 14.4 % (ref 11.7–15.4)
WBC: 7 x10E3/uL (ref 3.4–10.8)

## 2023-02-28 LAB — DIGOXIN LEVEL: Digoxin, Serum: 0.5 ng/mL (ref 0.5–0.9)

## 2023-02-28 LAB — TSH: TSH: 1.47 u[IU]/mL (ref 0.450–4.500)

## 2023-02-28 NOTE — Telephone Encounter (Signed)
Informed pt of normal blood work. She verbalized understanding and had no questions.

## 2023-02-28 NOTE — Telephone Encounter (Signed)
-----   Message from Flossie Dibble, NP sent at 02/28/2023  8:45 AM EDT ----- Normal liver function and electrolytes. Normal digoxin level.  CBC without anemia or infection.  Normal thyroid.   Best, Victorino Dike

## 2023-03-06 ENCOUNTER — Telehealth: Payer: Self-pay | Admitting: Cardiology

## 2023-03-06 LAB — LAB REPORT - SCANNED: EGFR: 60

## 2023-03-06 NOTE — Telephone Encounter (Signed)
Follow Up:      Patient's daughter is calling. She wants to know if the patient's monitor results are back. She said patient is dizzy this morning.   STAT if patient feels like he/she is going to faint   Are you dizzy now? Not at this time- dizzy earlier when she was laying down  Do you feel faint or have you passed out?   Do you have any other symptoms? Heart rate was 88 or 99 a few minutes ago  Have you checked your HR and BP (record if available)? no

## 2023-03-06 NOTE — Telephone Encounter (Signed)
Called Zella Ball, the patient's daughter and she informed us that she had taken the patient to the ER. I informed her to let the ER treat the patient and then she could follow up with [place] after she got out of the ER. The daughter was appreciative for the call and had no further questions at this time.

## 2023-03-13 ENCOUNTER — Telehealth: Payer: Self-pay | Admitting: Cardiology

## 2023-03-13 NOTE — Telephone Encounter (Signed)
Patient c/o Palpitations:  High priority if patient c/o lightheadedness, shortness of breath, or chest pain  How long have you had palpitations/irregular HR/ Afib? Are you having the symptoms now? Has been in Afib since 04/22  Are you currently experiencing lightheadedness, SOB or CP? No   Do you have a history of afib (atrial fibrillation) or irregular heart rhythm? Yes  Have you checked your BP or HR? (document readings if available): HR 80  Are you experiencing any other symptoms? Dizziness   Requesting an appt due to pt still being in A-fib since ER visit.

## 2023-03-13 NOTE — Telephone Encounter (Signed)
Spoke to the pt daughter Zella Ball (on [place]) she lives next door to her mother. Pt called daughter stated she is dizzy today( this is an ongoing issue) and daughter will like pt to be seen sooner than 6/3. Pt is schedule with MD on 5/24, explained ED precautions and pt daughter voiced understanding. Will forward to MD and nurse.

## 2023-03-14 ENCOUNTER — Other Ambulatory Visit: Payer: Self-pay

## 2023-03-14 NOTE — Telephone Encounter (Signed)
Attempted to leave message for the patient's daughter Brandt Loosen. Zella Ball did not answer the phone and her voice mail was full so unable to leave a message.

## 2023-03-17 NOTE — Telephone Encounter (Signed)
Attempted to leave message for the patient's daughter [name] Williams. [name] did not answer the phone and her voice mail was full so unable to leave a message. 

## 2023-03-20 NOTE — Progress Notes (Signed)
Cardiology Office Note:    Date:  03/21/2023   ID:  [name], [name] 02-Apr-1941, MRN [id number]  PCP:  Street, Stephanie Coup, MD  Cardiologist:  Norman Herrlich, MD    Referring MD: [address] Stephanie Coup, *    ASSESSMENT:    1. PAF (paroxysmal atrial fibrillation) (HCC)   2. Chronic anticoagulation   3. NICM (nonischemic cardiomyopathy) (HCC)   4. Mild CAD   5. Essential hypertension   6. Hypercholesterolemia    PLAN:    In order of problems listed above:  She is highly symptomatic with recurrent atrial fibrillation and risk of recurrent tachycardia induced cardiomyopathy in the first order of business is to cardiovert her back to sinus rhythm if successful initiate outpatient amiodarone 200 mg twice daily if unsuccessful referred to EP. Continue digoxin transition labetalol to Lopressor with hypotension Continue anticoagulant uninterrupted Purchase a validated blood pressure device Omron trend blood pressures at home Will schedule follow-up echocardiogram in the office   Next appointment: 2 weeks   Medication Adjustments/Labs and Tests Ordered: Current medicines are reviewed at length with the patient today.  Concerns regarding medicines are outlined above.  No orders of the defined types were placed in this encounter.  No orders of the defined types were placed in this encounter.     History of Present Illness:    [name] is a 82 y.o. female with a hx of nonobstructive CAD hypertension paroxysmal atrial fibrillation with anticoagulation cardiomyopathy secondary to tachyarrhythmia with heart failure last seen 02/17/2023 by Wallis Bamberg nurse practitioner back in atrial fibrillation rate controlled..  Echocardiogram performed 03/11/2022 showed normalization of left ventricular ejection fraction.  Her event monitor showed continuous atrial fibrillation.  She was seen at [facility] ED 03/06/2023 for complaint including generalized weakness and dizziness.   The ED describes this is persisting for 30 minutes.  In the emergency room she was hypotensive initially 92/50 8 repeat 11/24/1977 heart rate is in the 80s.  CBC was normal hemoglobin 13.2 creatinine 0.8 potassium 4.2.  Troponin was checked undetectable BNP level was elevated at 3590.  CT scan of the head showed no acute abnormality EKG was described as atrial fibrillation.  I independently reviewed that EKG showing atrial fibrillation with controlled ventricular rate.  Compliance with diet, lifestyle and medications: Yes  Not doing well predominately complaining of weakness Blood pressure in office today is low she is on high dose of beta-blocker we will discontinue transition to Lopressor plan cardioversion Tuesday [facility] Despite being weak she is not having edema orthopnea chest pain or syncope She remains in atrial fibrillation had previous tachycardia induced cardiomyopathy She has been compliant with her anticoagulant has not missed 2 doses supervised by her daughter Digoxin level was therapeutic [id number] 2024 Past Medical History:  Diagnosis Date   AML (acute myelogenous leukemia) (HCC)    Anxiety    Atrial fibrillation (HCC)    Barrett's esophagus    Basal cell carcinoma of vulva (HCC)    Depression    Endometrial polyp    "polyps" per intake sheet   GERD (gastroesophageal reflux disease)    Hemorrhoids    Hypertension    Spinal stenosis    Weakness     Past Surgical History:  Procedure Laterality Date   CATARACT EXTRACTION, BILATERAL     august/september 2017   cataract surgery     CERVICAL POLYPECTOMY N/A 08/14/2015   Procedure: CERVICAL POLYPECTOMY;  Surgeon: Tracey Harries, MD;  Location: [facility];  Service: Gynecology;  Laterality: N/A;   CHOLECYSTECTOMY     endometrial polyp surgery     HEMORROIDECTOMY     HYSTEROSCOPY WITH D & C N/A 08/14/2015   Procedure: DILATATION AND CURETTAGE /HYSTEROSCOPY;  Surgeon: Tracey Harries, MD;  Location: [facility];  Service:  Gynecology;  Laterality: N/A;   KNEE SURGERY Right    PORTACATH PLACEMENT     RIGHT/LEFT HEART CATH AND CORONARY ANGIOGRAPHY N/A 10/24/2021   Procedure: RIGHT/LEFT HEART CATH AND CORONARY ANGIOGRAPHY;  Surgeon: Tonny Bollman, MD;  Location: [facility] INVASIVE CV LAB;  Service: Cardiovascular;  Laterality: N/A;   TUNNELED VENOUS CATHETER PLACEMENT      Current Medications: Current Meds  Medication Sig   Cholecalciferol (VITAMIN D) 125 MCG (5000 UT) CAPS Take 5,000 Units by mouth daily.   diazepam (VALIUM) 10 MG tablet Take 10 mg by mouth 2 (two) times daily. Takes an additional tablet if needed in the afternoon   digoxin (LANOXIN) 0.125 MG tablet Take 125 mcg by mouth daily.   famotidine (PEPCID) 40 MG tablet Take 40 mg by mouth daily.   furosemide (LASIX) 20 MG tablet Take 20 mg by mouth as needed for fluid or edema.   labetalol (NORMODYNE) 100 MG tablet Take 100 mg by mouth 2 (two) times daily.   pantoprazole (PROTONIX) 40 MG tablet Take 1 tablet (40 mg total) by mouth daily.   rivaroxaban (XARELTO) 20 MG TABS tablet Take 1 tablet (20 mg total) by mouth daily with supper.   venlafaxine XR (EFFEXOR XR) 37.5 MG 24 hr capsule Take 1 capsule (37.5 mg total) by mouth daily with breakfast. Please follow-up with PCP for further refills.   venlafaxine XR (EFFEXOR-XR) 150 MG 24 hr capsule Take 150 mg by mouth daily.   vitamin B-12 (CYANOCOBALAMIN) 1000 MCG tablet Take 1,000 mcg by mouth daily.   Zinc 50 MG TABS Take 50 mg by mouth daily.     Allergies:   Entresto [sacubitril-valsartan], Compazine [prochlorperazine edisylate], Cortisone, Eliquis [apixaban], Prednisone, and Prochlorperazine   Social History   Socioeconomic History   Marital status: Married    Spouse name: Aveya Crye   Number of children: 1   Years of education: Not on file   Highest education level: High school graduate  Occupational History   Occupation: retired  Tobacco Use   Smoking status: Never    Passive exposure:  Never   Smokeless tobacco: Never  Vaping Use   Vaping Use: Never used  Substance and Sexual Activity   Alcohol use: No   Drug use: No   Sexual activity: Not on file  Other Topics Concern   Not on file  Social History Narrative   Lives at home with husband   Right handed   Caffeine: quit years ago   Social Determinants of Health   Financial Resource Strain: Low Risk  (10/26/2021)   Overall Financial Resource Strain (CARDIA)    Difficulty of Paying Living Expenses: Not hard at all  Food Insecurity: No Food Insecurity (10/26/2021)   Hunger Vital Sign    Worried About Running Out of Food in the Last Year: Never true    Ran Out of Food in the Last Year: Never true  Transportation Needs: No Transportation Needs (10/26/2021)   PRAPARE - Administrator, Civil Service (Medical): No    Lack of Transportation (Non-Medical): No  Physical Activity: Not on file  Stress: Not on file  Social Connections: Not on file     Family History:  The patient's family history includes Alzheimer's disease in her brother; Breast cancer in her paternal aunt; CVA in her mother; Diabetes in her mother; Heart attack in her brother and mother; Heart disease in her brother and mother; Lymphoma in her father; Non-Hodgkin's lymphoma in her father. There is no history of Colon cancer. ROS:   Please see the history of present illness.    All other systems reviewed and are negative.  EKGs/Labs/Other Studies Reviewed:    The following studies were reviewed today:  Cardiac Studies & Procedures   CARDIAC CATHETERIZATION  CARDIAC CATHETERIZATION 10/24/2021  Narrative   Mid LAD lesion is 40% stenosed.  1.  Mild diffuse nonobstructive coronary plaquing. 2.  Essentially normal intracardiac filling pressures with wedge pressure of 13 mmHg, PA pressure 25/9 with a mean of 16 mmHg, and preserved cardiac output of 7.4 L/min  Recommend: Patient appears to have nonischemic cardiomyopathy, likely  tachycardia-mediated. Patient remains in atrial fibrillation with RVR.  I do not think her blood pressure will tolerate aggressive beta-blocker titration.  Calcium channel blockers are contraindicated in the setting of her severe cardiomyopathy.  Would start IV amiodarone and consider TEE/cardioversion per primary team.  We will start on IV heparin post procedure with plans to transition to oral anticoagulation per primary team.  Findings Coronary Findings Diagnostic  Dominance: Right  Left Main The vessel exhibits minimal luminal irregularities.  Left Anterior Descending There is mild diffuse disease throughout the vessel. Mild diffuse plaquing in the LAD, focal 40 to 50% lesion in the mid LAD noted.  There is no obstructive disease throughout the LAD or its diagonal branches. Mid LAD lesion is 40% stenosed.  Left Circumflex The vessel exhibits minimal luminal irregularities.  Right Coronary Artery There is mild diffuse disease throughout the vessel. Large, dominant RCA.  Diffuse irregularity throughout.  The PDA and PLA branches are patent with no significant stenoses.  Intervention  No interventions have been documented.   STRESS TESTS  MYOCARDIAL PERFUSION IMAGING 02/08/2016  Narrative  Nuclear stress EF: 62%.  There was no ST segment deviation noted during stress.  The study is normal. no ecvidence of ischemia.  This is a low risk study.   ECHOCARDIOGRAM  ECHOCARDIOGRAM COMPLETE 02/18/2022  Narrative ECHOCARDIOGRAM REPORT    Patient Name:   Denise COOP Date of Exam: 02/18/2022 Medical Rec #:  [id number]      Height:       68.0 in Accession #:    [id number]     Weight:       164.0 lb Date of Birth:  04-Nov-1940      BSA:          1.878 m Patient Age:    82 years       BP:           120/76 mmHg Patient Gender: F              HR:           69 bpm. Exam Location:  [place]  Procedure: 2D Echo, Cardiac Doppler and Color Doppler  Indications:    Dyspnea  R06.00  History:        Patient has prior history of Echocardiogram examinations, most recent 10/24/2021. Arrythmias:Atrial Fibrillation, Signs/Symptoms:Fatigue; Risk Factors:Hypertension.  Sonographer:    Louie Boston RDCS Referring Phys: [id number] Emine Lopata J Julitza Rickles  IMPRESSIONS   1. Left ventricular ejection fraction, by estimation, is 45 to 50%. The left ventricle has mildly decreased function. The left ventricle has  no regional wall motion abnormalities. There is mild concentric left ventricular hypertrophy. Left ventricular diastolic parameters are consistent with Grade I diastolic dysfunction (impaired relaxation). 2. Right ventricular systolic function is normal. The right ventricular size is normal. There is normal pulmonary artery systolic pressure. 3. The mitral valve is normal in structure. No evidence of mitral valve regurgitation. No evidence of mitral stenosis. 4. The aortic valve is tricuspid. Aortic valve regurgitation is mild. Aortic valve sclerosis/calcification is present, without any evidence of aortic stenosis. 5. The inferior vena cava is normal in size with greater than 50% respiratory variability, suggesting right atrial pressure of 3 mmHg.  FINDINGS Left Ventricle: Left ventricular ejection fraction, by estimation, is 45 to 50%. The left ventricle has mildly decreased function. The left ventricle has no regional wall motion abnormalities. Global longitudinal strain performed but not reported based on interpreter judgement due to suboptimal tracking. The left ventricular internal cavity size was normal in size. There is mild concentric left ventricular hypertrophy. Left ventricular diastolic parameters are consistent with Grade I diastolic dysfunction (impaired relaxation). Indeterminate filling pressures.  Right Ventricle: The right ventricular size is normal. No increase in right ventricular wall thickness. Right ventricular systolic function is normal. There is normal  pulmonary artery systolic pressure. The tricuspid regurgitant velocity is 2.04 m/s, and with an assumed right atrial pressure of 3 mmHg, the estimated right ventricular systolic pressure is 19.6 mmHg.  Left Atrium: Left atrial size was normal in size.  Right Atrium: Right atrial size was normal in size.  Pericardium: There is no evidence of pericardial effusion.  Mitral Valve: The mitral valve is normal in structure. No evidence of mitral valve regurgitation. No evidence of mitral valve stenosis.  Tricuspid Valve: The tricuspid valve is normal in structure. Tricuspid valve regurgitation is trivial. No evidence of tricuspid stenosis.  Aortic Valve: The aortic valve is tricuspid. Aortic valve regurgitation is mild. Aortic regurgitation PHT measures 565 msec. Aortic valve sclerosis/calcification is present, without any evidence of aortic stenosis.  Pulmonic Valve: The pulmonic valve was normal in structure. Pulmonic valve regurgitation is not visualized. No evidence of pulmonic stenosis.  Aorta: The aortic root and ascending aorta are structurally normal, with no evidence of dilitation and the aortic arch was not well visualized.  Venous: The pulmonary veins were not well visualized. The inferior vena cava is normal in size with greater than 50% respiratory variability, suggesting right atrial pressure of 3 mmHg.  IAS/Shunts: No atrial level shunt detected by color flow Doppler.   LEFT VENTRICLE PLAX 2D LVIDd:         3.80 cm     Diastology LVIDs:         2.90 cm     LV e' medial:    4.43 cm/s LV PW:         1.10 cm     LV E/e' medial:  15.7 LV IVS:        1.20 cm     LV e' lateral:   6.38 cm/s LVOT diam:     2.00 cm     LV E/e' lateral: 10.9 LV SV:         67 LV SV Index:   35 LVOT Area:     3.14 cm  LV Volumes (MOD) LV vol d, MOD A2C: 55.6 ml LV vol d, MOD A4C: 43.6 ml LV vol s, MOD A2C: 31.5 ml LV vol s, MOD A4C: 21.3 ml LV SV MOD A2C:     24.1 ml  LV SV MOD A4C:     43.6  ml LV SV MOD BP:      22.0 ml  RIGHT VENTRICLE            IVC RV S prime:     9.67 cm/s  IVC diam: 1.60 cm TAPSE (M-mode): 1.9 cm  LEFT ATRIUM             Index        RIGHT ATRIUM           Index LA diam:        3.50 cm 1.86 cm/m   RA Area:     16.40 cm LA Vol (A2C):   38.1 ml 20.28 ml/m  RA Volume:   43.00 ml  22.89 ml/m LA Vol (A4C):   36.9 ml 19.64 ml/m LA Biplane Vol: 39.3 ml 20.92 ml/m AORTIC VALVE LVOT Vmax:   74.70 cm/s LVOT Vmean:  53.500 cm/s LVOT VTI:    0.212 m AI PHT:      565 msec  AORTA Ao Root diam: 3.10 cm Ao Asc diam:  3.30 cm Ao Desc diam: 2.00 cm  MITRAL VALVE               TRICUSPID VALVE MV Area (PHT): 2.34 cm    TR Peak grad:   16.6 mmHg MV Decel Time: 324 msec    TR Vmax:        204.00 cm/s MV E velocity: 69.40 cm/s MV A velocity: 81.80 cm/s  SHUNTS MV E/A ratio:  0.85        Systemic VTI:  0.21 m Systemic Diam: 2.00 cm  Norman Herrlich MD Electronically signed by Norman Herrlich MD Signature Date/Time: 02/18/2022/12:09:24 PM    Final    MONITORS  LONG TERM MONITOR (3-14 DAYS) 03/20/2023  Narrative Patch Wear Time:  8 days and 9 hours (2024-04-22T16:55:07-399 to 2024-05-01T02:42:18-0400)  Atrial Fibrillation occurred continuously (100% burden), ranging from 40-143 bpm (avg of 75 bpm). Isolated VEs were rare (<1.0%, 6185), and no VE Couplets or VE Triplets were present.  Heart rate response with atrial fibrillation is 50 to 110 bpm 99% of the time daytime 100% of the time nighttime.  There were no symptomatic events noted  There were no pauses of 3 seconds or greater.  Ventricular ectopy was rare.           EKG:  EKG ordered today and personally reviewed.  The ekg ordered today demonstrates atrial fibrillation controlled ventricular rate QS V1 to V3 lead placement versus old anterior MI   Physical Exam:    VS:  BP (!) 97/58 (BP Location: Left Arm, Patient Position: Sitting)   Pulse 60   Ht 5\' 8"  (1.727 m)   Wt 147 lb (66.7 kg)    SpO2 97%   BMI 22.35 kg/m     Wt Readings from Last 3 Encounters:  03/21/23 147 lb (66.7 kg)  02/17/23 154 lb (69.9 kg)  03/11/22 165 lb 6.4 oz (75 kg)     GEN: She looks frail well nourished, well developed in no acute distress HEENT: Normal NECK: No JVD; No carotid bruits LYMPHATICS: No lymphadenopathy CARDIAC: Irregular rate and rhythm variable first heart sound  RESPIRATORY:  Clear to auscultation without rales, wheezing or rhonchi  ABDOMEN: Soft, non-tender, non-distended MUSCULOSKELETAL:  No edema; No deformity  SKIN: Warm and dry NEUROLOGIC:  Alert and oriented x 3 PSYCHIATRIC:  Normal affect    Signed, Norman Herrlich, MD  03/21/2023 12:58 PM  [facility]

## 2023-03-21 ENCOUNTER — Encounter: Payer: Self-pay | Admitting: Cardiology

## 2023-03-21 ENCOUNTER — Ambulatory Visit: Payer: Medicare HMO | Attending: Cardiology | Admitting: Cardiology

## 2023-03-21 VITALS — BP 97/58 | HR 60 | Ht 68.0 in | Wt 147.0 lb

## 2023-03-21 DIAGNOSIS — I10 Essential (primary) hypertension: Secondary | ICD-10-CM

## 2023-03-21 DIAGNOSIS — I480 Paroxysmal atrial fibrillation: Secondary | ICD-10-CM

## 2023-03-21 DIAGNOSIS — I2510 Atherosclerotic heart disease of native coronary artery without angina pectoris: Secondary | ICD-10-CM | POA: Diagnosis not present

## 2023-03-21 DIAGNOSIS — Z7901 Long term (current) use of anticoagulants: Secondary | ICD-10-CM | POA: Diagnosis not present

## 2023-03-21 DIAGNOSIS — I428 Other cardiomyopathies: Secondary | ICD-10-CM

## 2023-03-21 DIAGNOSIS — E7800 Pure hypercholesterolemia, unspecified: Secondary | ICD-10-CM

## 2023-03-21 MED ORDER — RIVAROXABAN 20 MG PO TABS: 20.0000 mg | ORAL_TABLET | Freq: Every day | ORAL | 0 refills | Status: DC

## 2023-03-21 MED ORDER — METOPROLOL TARTRATE 25 MG PO TABS: 25.0000 mg | ORAL_TABLET | Freq: Two times a day (BID) | ORAL | 3 refills | Status: DC

## 2023-03-21 NOTE — Patient Instructions (Signed)
Medication Instructions:  Your physician has recommended you make the following change in your medication:   STOP: Labetolol START: Lopressor 25 mg twice daily  *If you need a refill on your cardiac medications before your next appointment, please call your pharmacy*   Lab Work: Your physician recommends that you return for lab work in:   Labs today: CBC, BMP  If you have labs (blood work) drawn today and your tests are completely normal, you will receive your results only by: MyChart Message (if you have MyChart) OR A paper copy in the mail If you have any lab test that is abnormal or we need to change your treatment, we will call you to review the results.   Testing/Procedures: Your physician has requested that you have an echocardiogram. Echocardiography is a painless test that uses sound waves to create images of your heart. It provides your doctor with information about the size and shape of your heart and how well your heart's chambers and valves are working. This procedure takes approximately one hour. There are no restrictions for this procedure. Please do NOT wear cologne, perfume, aftershave, or lotions (deodorant is allowed). Please arrive 15 minutes prior to your appointment time.   Your physician has recommended that you have a Cardioversion (DCCV). Electrical Cardioversion uses a jolt of electricity to your heart either through paddles or wired patches attached to your chest. This is a controlled, usually prescheduled, procedure. Defibrillation is done under light anesthesia in the hospital, and you usually go home the day of the procedure. This is done to get your heart back into a normal rhythm. You are not awake for the procedure. Please see the instruction sheet given to you today.    Your next appointment:   2 weeks  Provider:   Norman Herrlich, MD    Other Instructions Purchase an Omron blood pressure device

## 2023-03-21 NOTE — Telephone Encounter (Signed)
Patient was seen by Dr. Dulce Sellar in the clinic and is being scheduled for a cardioversion at [facility]. Patient had no further questions at this time.

## 2023-03-22 LAB — BASIC METABOLIC PANEL
BUN/Creatinine Ratio: 14 (ref 12–28)
BUN: 12 mg/dL (ref 8–27)
CO2: 22 mmol/L (ref 20–29)
Calcium: 9.7 mg/dL (ref 8.7–10.3)
Chloride: 103 mmol/L (ref 96–106)
Creatinine, Ser: 0.88 mg/dL (ref 0.57–1.00)
Glucose: 96 mg/dL (ref 70–99)
Potassium: 4.3 mmol/L (ref 3.5–5.2)
Sodium: 140 mmol/L (ref 134–144)
eGFR: 65 mL/min/{1.73_m2} (ref >59)

## 2023-03-22 LAB — CBC
Hematocrit: 38.2 % (ref 34.0–46.6)
Hemoglobin: 12 g/dL (ref 11.1–15.9)
MCH: 27.3 pg (ref 26.6–33.0)
MCHC: 31.4 g/dL — ABNORMAL LOW (ref 31.5–35.7)
MCV: 87 fL (ref 79–97)
Platelets: 185 10*3/uL (ref 150–450)
RBC: 4.39 x10E6/uL (ref 3.77–5.28)
RDW: 14.9 % (ref 11.7–15.4)
WBC: 6.6 10*3/uL (ref 3.4–10.8)

## 2023-03-25 ENCOUNTER — Telehealth: Payer: Self-pay

## 2023-03-25 NOTE — Telephone Encounter (Signed)
-----   Message from Baldo Daub, MD sent at 03/24/2023  7:53 PM EDT ----- Normal or stable result

## 2023-03-25 NOTE — Telephone Encounter (Signed)
Spoke with Zella Ball, notified of results.

## 2023-03-26 ENCOUNTER — Telehealth: Payer: Self-pay | Admitting: Cardiology

## 2023-03-26 NOTE — Telephone Encounter (Signed)
Pt's daughter calling, she is following up pt's cardioversion. She said, pt is still in afib. She said to call her at [phone]

## 2023-03-28 ENCOUNTER — Telehealth: Payer: Self-pay | Admitting: Cardiology

## 2023-03-28 DIAGNOSIS — R0602 Shortness of breath: Secondary | ICD-10-CM | POA: Insufficient documentation

## 2023-03-28 DIAGNOSIS — R0789 Other chest pain: Secondary | ICD-10-CM | POA: Insufficient documentation

## 2023-03-28 DIAGNOSIS — U071 COVID-19: Secondary | ICD-10-CM | POA: Insufficient documentation

## 2023-03-28 DIAGNOSIS — I428 Other cardiomyopathies: Secondary | ICD-10-CM | POA: Insufficient documentation

## 2023-03-28 DIAGNOSIS — S72001A Fracture of unspecified part of neck of right femur, initial encounter for closed fracture: Secondary | ICD-10-CM | POA: Insufficient documentation

## 2023-03-28 DIAGNOSIS — I509 Heart failure, unspecified: Secondary | ICD-10-CM | POA: Insufficient documentation

## 2023-03-28 NOTE — Telephone Encounter (Signed)
Patient's daughter calling in to clarify appts. All questions answered.   Patient's daughter Zella Ball says her mom was supposed to have a cardioversion this last Tuesday but insurance had not come back, she would like a call letting her know if it has came back or not.  Before 3 PM she can be reached at work - [phone]  After 3PM- [phone], you can leave a VM if need be. She doesn't answer the phone while driving.   Thank you

## 2023-03-31 ENCOUNTER — Ambulatory Visit: Payer: Medicare HMO | Admitting: Cardiology

## 2023-03-31 NOTE — Telephone Encounter (Signed)
Patient's daughter came by the office today to ask about her mother's cadioversion. Explained that the pre-certification with the patient's insurance had not come back. I told the daughter that I would send another message to the pre-cert department to inquire about the patient's pre-certification for her cardioversion. Patient's daughter had no further questions at this time.

## 2023-03-31 NOTE — Telephone Encounter (Signed)
Patient's daughter came by the office today to ask about her mother's cadioversion. Explained that the pre-certification with the patient's insurance had not come back. I told the daughter that I would send another message to the pre-cert department to inquire about the patient's pre-certification for her cardioversion. Patient's daughter had no further questions at this time.     

## 2023-04-04 ENCOUNTER — Ambulatory Visit: Payer: Medicare HMO | Attending: Cardiology | Admitting: Cardiology

## 2023-04-04 ENCOUNTER — Encounter: Payer: Self-pay | Admitting: Cardiology

## 2023-04-04 VITALS — BP 90/60 | HR 52 | Ht 68.0 in | Wt 147.0 lb

## 2023-04-04 DIAGNOSIS — I5042 Chronic combined systolic (congestive) and diastolic (congestive) heart failure: Secondary | ICD-10-CM

## 2023-04-04 DIAGNOSIS — D6859 Other primary thrombophilia: Secondary | ICD-10-CM | POA: Diagnosis not present

## 2023-04-04 DIAGNOSIS — I2510 Atherosclerotic heart disease of native coronary artery without angina pectoris: Secondary | ICD-10-CM

## 2023-04-04 DIAGNOSIS — I428 Other cardiomyopathies: Secondary | ICD-10-CM | POA: Diagnosis not present

## 2023-04-04 DIAGNOSIS — I10 Essential (primary) hypertension: Secondary | ICD-10-CM

## 2023-04-04 DIAGNOSIS — I480 Paroxysmal atrial fibrillation: Secondary | ICD-10-CM | POA: Diagnosis not present

## 2023-04-04 NOTE — Patient Instructions (Signed)
Medication Instructions:  Your physician recommends that you continue on your current medications as directed. Please refer to the Current Medication list given to you today.  *If you need a refill on your cardiac medications before your next appointment, please call your pharmacy*   Lab Work: NONE If you have labs (blood work) drawn today and your tests are completely normal, you will receive your results only by: MyChart Message (if you have MyChart) OR A paper copy in the mail If you have any lab test that is abnormal or we need to change your treatment, we will call you to review the results.   Testing/Procedures: NONE   Follow-Up: At [facility], you and your health needs are our priority.  As part of our continuing mission to provide you with exceptional heart care, we have created designated Provider Care Teams.  These Care Teams include your primary Cardiologist (physician) and Advanced Practice Providers (APPs -  Physician Assistants and Nurse Practitioners) who all work together to provide you with the care you need, when you need it.  We recommend signing up for the patient portal called "MyChart".  Sign up information is provided on this After Visit Summary.  MyChart is used to connect with patients for Virtual Visits (Telemedicine).  Patients are able to view lab/test results, encounter notes, upcoming appointments, etc.  Non-urgent messages can be sent to your provider as well.   To learn more about what you can do with MyChart, go to [email or web address].    Your next appointment:   3-4 week(s)  Provider:   Norman Herrlich, MD    Other Instructions

## 2023-04-04 NOTE — Progress Notes (Signed)
 " Cardiology Office Note:    Date:  04/04/2023   ID:  Denise, Snyder October 23, 1941, MRN [id number]  PCP:  Street, Lonni HERO, MD   [facility] Providers Cardiologist:  Redell Leiter, MD     Referring MD: Street, Lonni HERO, *   Chief Complaint  Patient presents with   Discuss Cardioversion   [name] is a 82 y.o. female with a hx of non-obstructive CAD, hypertension, atrial fibrillation on Xarelto , NICM felt to be secondary to tachyarrythmia, systolic heart failure, GERD, Barrett's esophagus, depression, anxiety, AML.   [facility] 10/24/2021 revealed mild diffuse nonobstructive coronary artery disease, mid LAD lesion 40% stenosed.  Noted to have NICM felt to be tachycardia mediated.  Echo in May 2023 revealed an EF 50 to 55%, impaired relaxation, mild aortic sclerosis without stenosis, trace MR.   She was admitted to [facility] on 10/01/2022 to 10/07/2022 s/p fall resulting in closed right hip fracture.  She developed A-fib with RVR in the postoperative.  However with beta-blocker and digoxin  her rates improved.  Most recently she was evaluated by Dr. Leiter on 03/21/2023, she was in atrial fibrillation, highly symptomatic.  He recently wore a monitor that revealed she was in atrial fibrillation 100% of the time.  Recommendations to proceed with a DCCV.  She presents today accompanied by her daughter for follow-up regarding cardioversion, cardioversion has not taken place yet.  It appears that her insurance did not back to us  regarding her prior authorization.  She continues to be symptomatic with her atrial fibrillation, endorses fatigue.  She has not had any missed doses of her anticoagulant and wants to proceed with DCCV.    Past Medical History:  Diagnosis Date   AML (acute myelogenous leukemia) (HCC)    Anxiety    Atrial fibrillation (HCC)    Barrett's esophagus    Basal cell carcinoma of vulva (HCC)    Depression    Endometrial polyp    polyps per  intake sheet   GERD (gastroesophageal reflux disease)    Hemorrhoids    Hypertension    Spinal stenosis    Weakness     Past Surgical History:  Procedure Laterality Date   CATARACT EXTRACTION, BILATERAL     august/september 2017   cataract surgery     CERVICAL POLYPECTOMY N/A 08/14/2015   Procedure: CERVICAL POLYPECTOMY;  Surgeon: Debby Lares, MD;  Location: [facility];  Service: Gynecology;  Laterality: N/A;   CHOLECYSTECTOMY     endometrial polyp surgery     HEMORROIDECTOMY     HYSTEROSCOPY WITH D & C N/A 08/14/2015   Procedure: DILATATION AND CURETTAGE /HYSTEROSCOPY;  Surgeon: Debby Lares, MD;  Location: [facility];  Service: Gynecology;  Laterality: N/A;   KNEE SURGERY Right    PORTACATH PLACEMENT     RIGHT/LEFT HEART CATH AND CORONARY ANGIOGRAPHY N/A 10/24/2021   Procedure: RIGHT/LEFT HEART CATH AND CORONARY ANGIOGRAPHY;  Surgeon: Wonda Sharper, MD;  Location: [facility] INVASIVE CV LAB;  Service: Cardiovascular;  Laterality: N/A;   TUNNELED VENOUS CATHETER PLACEMENT      Current Medications: Current Meds  Medication Sig   Cholecalciferol  (VITAMIN D ) 125 MCG (5000 UT) CAPS Take 5,000 Units by mouth daily.   diazepam  (VALIUM ) 10 MG tablet Take 10 mg by mouth 2 (two) times daily. Takes an additional tablet if needed in the afternoon   digoxin  (LANOXIN ) 0.125 MG tablet Take 125 mcg by mouth daily.   furosemide  (LASIX ) 20 MG tablet Take 20 mg by mouth  as needed for fluid or edema.   metoprolol  tartrate (LOPRESSOR ) 25 MG tablet Take 1 tablet (25 mg total) by mouth 2 (two) times daily.   pantoprazole  (PROTONIX ) 40 MG tablet Take 1 tablet (40 mg total) by mouth daily.   rivaroxaban  (XARELTO ) 20 MG TABS tablet Take 1 tablet (20 mg total) by mouth daily with supper.   venlafaxine  XR (EFFEXOR  XR) 37.5 MG 24 hr capsule Take 1 capsule (37.5 mg total) by mouth daily with breakfast. Please follow-up with PCP for further refills.   venlafaxine  XR (EFFEXOR -XR) 150 MG 24 hr capsule Take 150 mg by  mouth daily.   vitamin B-12 (CYANOCOBALAMIN ) 1000 MCG tablet Take 1,000 mcg by mouth daily.   Zinc 50 MG TABS Take 50 mg by mouth daily.   [DISCONTINUED] famotidine  (PEPCID ) 40 MG tablet Take 40 mg by mouth daily.   [DISCONTINUED] rivaroxaban  (XARELTO ) 20 MG TABS tablet Take 1 tablet (20 mg total) by mouth daily with supper.     Allergies:   Entresto [sacubitril-valsartan], Compazine [prochlorperazine edisylate], Cortisone, Eliquis [apixaban], Prednisone, and Prochlorperazine   Social History   Socioeconomic History   Marital status: Married    Spouse name: Ericka Marcellus   Number of children: 1   Years of education: Not on file   Highest education level: High school graduate  Occupational History   Occupation: retired  Tobacco Use   Smoking status: Never    Passive exposure: Never   Smokeless tobacco: Never  Vaping Use   Vaping Use: Never used  Substance and Sexual Activity   Alcohol  use: No   Drug use: No   Sexual activity: Not on file  Other Topics Concern   Not on file  Social History Narrative   Lives at home with husband   Right handed   Caffeine: quit years ago   Social Determinants of Health   Financial Resource Strain: Low Risk  (10/26/2021)   Overall Financial Resource Strain (CARDIA)    Difficulty of Paying Living Expenses: Not hard at all  Food Insecurity: No Food Insecurity (10/26/2021)   Hunger Vital Sign    Worried About Running Out of Food in the Last Year: Never true    Ran Out of Food in the Last Year: Never true  Transportation Needs: No Transportation Needs (10/26/2021)   PRAPARE - Administrator, Civil Service (Medical): No    Lack of Transportation (Non-Medical): No  Physical Activity: Not on file  Stress: Not on file  Social Connections: Not on file     Family History: The patient's family history includes Alzheimer's disease in her brother; Breast cancer in her paternal aunt; CVA in her mother; Diabetes in her mother; Heart  attack in her brother and mother; Heart disease in her brother and mother; Lymphoma in her father; Non-Hodgkin's lymphoma in her father. There is no history of Colon cancer.  ROS:   Please see the history of present illness.     All other systems reviewed and are negative.  EKGs/Labs/Other Studies Reviewed:    The following studies were reviewed today: Cardiac Studies & Procedures   CARDIAC CATHETERIZATION  CARDIAC CATHETERIZATION 10/24/2021  Narrative   Mid LAD lesion is 40% stenosed.  1.  Mild diffuse nonobstructive coronary plaquing. 2.  Essentially normal intracardiac filling pressures with wedge pressure of 13 mmHg, PA pressure 25/9 with a mean of 16 mmHg, and preserved cardiac output of 7.4 L/min  Recommend: Patient appears to have nonischemic cardiomyopathy, likely tachycardia-mediated. Patient remains  in atrial fibrillation with RVR.  I do not think her blood pressure will tolerate aggressive beta-blocker titration.  Calcium  channel blockers are contraindicated in the setting of her severe cardiomyopathy.  Would start IV amiodarone  and consider TEE/cardioversion per primary team.  We will start on IV heparin  post procedure with plans to transition to oral anticoagulation per primary team.  Findings Coronary Findings Diagnostic  Dominance: Right  Left Main The vessel exhibits minimal luminal irregularities.  Left Anterior Descending There is mild diffuse disease throughout the vessel. Mild diffuse plaquing in the LAD, focal 40 to 50% lesion in the mid LAD noted.  There is no obstructive disease throughout the LAD or its diagonal branches. Mid LAD lesion is 40% stenosed.  Left Circumflex The vessel exhibits minimal luminal irregularities.  Right Coronary Artery There is mild diffuse disease throughout the vessel. Large, dominant RCA.  Diffuse irregularity throughout.  The PDA and PLA branches are patent with no significant stenoses.  Intervention  No interventions have  been documented.   STRESS TESTS  MYOCARDIAL PERFUSION IMAGING 02/08/2016  Narrative  Nuclear stress EF: 62%.  There was no ST segment deviation noted during stress.  The study is normal. no ecvidence of ischemia.  This is a low risk study.   ECHOCARDIOGRAM  ECHOCARDIOGRAM COMPLETE 02/18/2022  Narrative ECHOCARDIOGRAM REPORT    Patient Name:   [name] Date of Exam: 02/18/2022 Medical Rec #:  [id number]      Height:       68.0 in Accession #:    [id number]     Weight:       164.0 lb Date of Birth:  [birth year]      BSA:          1.878 m Patient Age:    80 years       BP:           120/76 mmHg Patient Gender: F              HR:           69 bpm. Exam Location:  [place]  Procedure: 2D Echo, Cardiac Doppler and Color Doppler  Indications:    Dyspnea R06.00  History:        Patient has prior history of Echocardiogram examinations, most recent 10/24/2021. Arrythmias:Atrial Fibrillation, Signs/Symptoms:Fatigue; Risk Factors:Hypertension.  Sonographer:    Lynwood Silvas RDCS Referring Phys: [id number] BRIAN J MUNLEY  IMPRESSIONS   1. Left ventricular ejection fraction, by estimation, is 45 to 50%. The left ventricle has mildly decreased function. The left ventricle has no regional wall motion abnormalities. There is mild concentric left ventricular hypertrophy. Left ventricular diastolic parameters are consistent with Grade I diastolic dysfunction (impaired relaxation). 2. Right ventricular systolic function is normal. The right ventricular size is normal. There is normal pulmonary artery systolic pressure. 3. The mitral valve is normal in structure. No evidence of mitral valve regurgitation. No evidence of mitral stenosis. 4. The aortic valve is tricuspid. Aortic valve regurgitation is mild. Aortic valve sclerosis/calcification is present, without any evidence of aortic stenosis. 5. The inferior vena cava is normal in size with greater than 50% respiratory variability,  suggesting right atrial pressure of 3 mmHg.  FINDINGS Left Ventricle: Left ventricular ejection fraction, by estimation, is 45 to 50%. The left ventricle has mildly decreased function. The left ventricle has no regional wall motion abnormalities. Global longitudinal strain performed but not reported based on interpreter judgement due to suboptimal tracking. The left ventricular internal cavity size  was normal in size. There is mild concentric left ventricular hypertrophy. Left ventricular diastolic parameters are consistent with Grade I diastolic dysfunction (impaired relaxation). Indeterminate filling pressures.  Right Ventricle: The right ventricular size is normal. No increase in right ventricular wall thickness. Right ventricular systolic function is normal. There is normal pulmonary artery systolic pressure. The tricuspid regurgitant velocity is 2.04 m/s, and with an assumed right atrial pressure of 3 mmHg, the estimated right ventricular systolic pressure is 19.6 mmHg.  Left Atrium: Left atrial size was normal in size.  Right Atrium: Right atrial size was normal in size.  Pericardium: There is no evidence of pericardial effusion.  Mitral Valve: The mitral valve is normal in structure. No evidence of mitral valve regurgitation. No evidence of mitral valve stenosis.  Tricuspid Valve: The tricuspid valve is normal in structure. Tricuspid valve regurgitation is trivial. No evidence of tricuspid stenosis.  Aortic Valve: The aortic valve is tricuspid. Aortic valve regurgitation is mild. Aortic regurgitation PHT measures 565 msec. Aortic valve sclerosis/calcification is present, without any evidence of aortic stenosis.  Pulmonic Valve: The pulmonic valve was normal in structure. Pulmonic valve regurgitation is not visualized. No evidence of pulmonic stenosis.  Aorta: The aortic root and ascending aorta are structurally normal, with no evidence of dilitation and the aortic arch was not well  visualized.  Venous: The pulmonary veins were not well visualized. The inferior vena cava is normal in size with greater than 50% respiratory variability, suggesting right atrial pressure of 3 mmHg.  IAS/Shunts: No atrial level shunt detected by color flow Doppler.   LEFT VENTRICLE PLAX 2D LVIDd:         3.80 cm     Diastology LVIDs:         2.90 cm     LV e' medial:    4.43 cm/s LV PW:         1.10 cm     LV E/e' medial:  15.7 LV IVS:        1.20 cm     LV e' lateral:   6.38 cm/s LVOT diam:     2.00 cm     LV E/e' lateral: 10.9 LV SV:         67 LV SV Index:   35 LVOT Area:     3.14 cm  LV Volumes (MOD) LV vol d, MOD A2C: 55.6 ml LV vol d, MOD A4C: 43.6 ml LV vol s, MOD A2C: 31.5 ml LV vol s, MOD A4C: 21.3 ml LV SV MOD A2C:     24.1 ml LV SV MOD A4C:     43.6 ml LV SV MOD BP:      22.0 ml  RIGHT VENTRICLE            IVC RV S prime:     9.67 cm/s  IVC diam: 1.60 cm TAPSE (M-mode): 1.9 cm  LEFT ATRIUM             Index        RIGHT ATRIUM           Index LA diam:        3.50 cm 1.86 cm/m   RA Area:     16.40 cm LA Vol (A2C):   38.1 ml 20.28 ml/m  RA Volume:   43.00 ml  22.89 ml/m LA Vol (A4C):   36.9 ml 19.64 ml/m LA Biplane Vol: 39.3 ml 20.92 ml/m AORTIC VALVE LVOT Vmax:   74.70 cm/s LVOT Vmean:  53.500 cm/s LVOT VTI:    0.212 m AI PHT:      565 msec  AORTA Ao Root diam: 3.10 cm Ao Asc diam:  3.30 cm Ao Desc diam: 2.00 cm  MITRAL VALVE               TRICUSPID VALVE MV Area (PHT): 2.34 cm    TR Peak grad:   16.6 mmHg MV Decel Time: 324 msec    TR Vmax:        204.00 cm/s MV E velocity: 69.40 cm/s MV A velocity: 81.80 cm/s  SHUNTS MV E/A ratio:  0.85        Systemic VTI:  0.21 m Systemic Diam: 2.00 cm  Redell Leiter MD Electronically signed by Redell Leiter MD Signature Date/Time: 02/18/2022/12:09:24 PM    Final    MONITORS  LONG TERM MONITOR (3-14 DAYS) 03/20/2023  Narrative Patch Wear Time:  8 days and 9 hours (2024-04-22T16:55:07-399 to  2024-05-01T02:42:18-0400)  Atrial Fibrillation occurred continuously (100% burden), ranging from 40-143 bpm (avg of 75 bpm). Isolated VEs were rare (<1.0%, 6185), and no VE Couplets or VE Triplets were present.  Heart rate response with atrial fibrillation is 50 to 110 bpm 99% of the time daytime 100% of the time nighttime.  There were no symptomatic events noted  There were no pauses of 3 seconds or greater.  Ventricular ectopy was rare.            EKG:  EKG is  ordered today.  The ekg ordered today demonstrates fibrillation,  Recent Labs: 02/27/2023: ALT 11; TSH 1.470 03/21/2023: BUN 12; Creatinine, Ser 0.88; Hemoglobin 12.0; Platelets 185; Potassium 4.3; Sodium 140  Recent Lipid Panel No results found for: CHOL, TRIG, HDL, CHOLHDL, VLDL, LDLCALC, LDLDIRECT   Risk Assessment/Calculations:    CHA2DS2-VASc Score = 6   This indicates a 9.7% annual risk of stroke. The patient's score is based upon: CHF History: 1 HTN History: 1 Diabetes History: 0 Stroke History: 0 Vascular Disease History: 1 Age Score: 2 Gender Score: 1               Physical Exam:    VS:  BP 90/60 (BP Location: Left Arm, Patient Position: Sitting)   Pulse (!) 52   Ht 5' 8 (1.727 m)   Wt 147 lb (66.7 kg)   SpO2 96%   BMI 22.35 kg/m     Wt Readings from Last 3 Encounters:  04/04/23 147 lb (66.7 kg)  03/21/23 147 lb (66.7 kg)  02/17/23 154 lb (69.9 kg)     GEN:  Well nourished, well developed in no acute distress HEENT: Normal NECK: No JVD; No carotid bruits LYMPHATICS: No lymphadenopathy CARDIAC: Regularly irregular, no murmurs, rubs, gallops RESPIRATORY:  Clear to auscultation without rales, wheezing or rhonchi  ABDOMEN: Soft, non-tender, non-distended MUSCULOSKELETAL:  No edema; No deformity  SKIN: Warm and dry NEUROLOGIC:  Alert and oriented x 3 PSYCHIATRIC:  Normal affect   ASSESSMENT:    1. PAF (paroxysmal atrial fibrillation) (HCC)    PLAN:    In order of  problems listed above:   PAF/chronic anticoagulation - EKG reveals rate controlled atrial fibrillation today. She is symptomatic endorsing fatigue. CHADSVasc score of 7.  Continue Xarelto  20 mg daily--currently no indication for reduced dosing CrCl 100, labetalol  100 mg twice a day, continue digoxin  125 mcg every day.  Recent monitor revealed she is in atrial fibrillation 100% of the time.  She has had no missed doses of her  Xarelto .  Discussed DCCV again, answered questions to her satisfaction.   CAD -mild diffuse nonobstructive coronary plaquing on [facility] December 2022, Stable with no anginal symptoms. No indication for ischemic evaluation.  Continue labetalol  100 mg twice a day, currently anticoagulated on Xarelto .   NICM/HFrEF with improved EF -most recent echo in May 2023 revealed EF 50 to 55%, NYHA class II--likely d/t deconditioning, euvolemic.  Continue labetalol  per above, has Lasix  as needed for weight gain, uses approximately 1 time per month.   HTN -blood pressure today is 90/60, continue labetalol  100 mg twice a day.  Disposition-proceed with DCCV as previously arranged.  Shared Decision Making/Informed Consent The risks (stroke, cardiac arrhythmias rarely resulting in the need for a temporary or permanent pacemaker, skin irritation or burns and complications associated with conscious sedation including aspiration, arrhythmia, respiratory failure and death), benefits (restoration of normal sinus rhythm) and alternatives of a direct current cardioversion were explained in detail to Ms. Risinger and she agrees to proceed.      Medication Adjustments/Labs and Tests Ordered: Current medicines are reviewed at length with the patient today.  Concerns regarding medicines are outlined above.  Orders Placed This Encounter  Procedures   EKG 12-Lead   No orders of the defined types were placed in this encounter.   Patient Instructions  Medication Instructions:  Your physician recommends that  you continue on your current medications as directed. Please refer to the Current Medication list given to you today.  *If you need a refill on your cardiac medications before your next appointment, please call your pharmacy*   Lab Work: NONE If you have labs (blood work) drawn today and your tests are completely normal, you will receive your results only by: MyChart Message (if you have MyChart) OR A paper copy in the mail If you have any lab test that is abnormal or we need to change your treatment, we will call you to review the results.   Testing/Procedures: NONE   Follow-Up: At [facility], you and your health needs are our priority.  As part of our continuing mission to provide you with exceptional heart care, we have created designated Provider Care Teams.  These Care Teams include your primary Cardiologist (physician) and Advanced Practice Providers (APPs -  Physician Assistants and Nurse Practitioners) who all work together to provide you with the care you need, when you need it.  We recommend signing up for the patient portal called MyChart.  Sign up information is provided on this After Visit Summary.  MyChart is used to connect with patients for Virtual Visits (Telemedicine).  Patients are able to view lab/test results, encounter notes, upcoming appointments, etc.  Non-urgent messages can be sent to your provider as well.   To learn more about what you can do with MyChart, go to [email or web address].    Your next appointment:   3-4 week(s)  Provider:   Redell Leiter, MD    Other Instructions     Signed, Delon JAYSON Hoover, NP  04/04/2023 5:22 PM    [facility] "

## 2023-04-07 ENCOUNTER — Telehealth: Payer: Self-pay

## 2023-04-07 NOTE — Telephone Encounter (Signed)
Called Becky at [facility] and scheduled the patient's cardioversion for Thursday. Kriste Basque stated that they will called the patient the night before and review instructions with them and tell them the time for the cardioversion then. I also spoke with the patient's daughter and informed her of this same information. The patient's daughter had no further questions at this time.

## 2023-04-10 ENCOUNTER — Encounter: Payer: Self-pay | Admitting: *Deleted

## 2023-04-10 DIAGNOSIS — R001 Bradycardia, unspecified: Secondary | ICD-10-CM

## 2023-04-10 DIAGNOSIS — I440 Atrioventricular block, first degree: Secondary | ICD-10-CM

## 2023-04-10 DIAGNOSIS — I4891 Unspecified atrial fibrillation: Secondary | ICD-10-CM

## 2023-04-14 ENCOUNTER — Telehealth: Payer: Self-pay | Admitting: Cardiology

## 2023-04-14 MED ORDER — DIGOXIN 125 MCG PO TABS: 125.0000 ug | ORAL_TABLET | Freq: Every day | ORAL | 3 refills | Status: DC

## 2023-04-14 NOTE — Telephone Encounter (Signed)
RX sent

## 2023-04-14 NOTE — Telephone Encounter (Signed)
*  STAT* If patient is at the pharmacy, call can be transferred to refill team.   1. Which medications need to be refilled? (please list name of each medication and dose if known) digoxin (LANOXIN) 0.125 MG tablet  2. Which pharmacy/location (including street and city if local pharmacy) is medication to be sent to? [id number] - [place], [place] - [address]  3. Do they need a 30 day or 90 day supply?  90 day supply

## 2023-04-18 ENCOUNTER — Ambulatory Visit: Payer: Medicare HMO | Attending: Cardiology

## 2023-04-18 DIAGNOSIS — I480 Paroxysmal atrial fibrillation: Secondary | ICD-10-CM | POA: Diagnosis not present

## 2023-04-18 DIAGNOSIS — I428 Other cardiomyopathies: Secondary | ICD-10-CM | POA: Diagnosis not present

## 2023-04-18 DIAGNOSIS — I2510 Atherosclerotic heart disease of native coronary artery without angina pectoris: Secondary | ICD-10-CM

## 2023-04-18 DIAGNOSIS — Z7901 Long term (current) use of anticoagulants: Secondary | ICD-10-CM | POA: Diagnosis not present

## 2023-04-18 DIAGNOSIS — E7800 Pure hypercholesterolemia, unspecified: Secondary | ICD-10-CM

## 2023-04-18 DIAGNOSIS — I10 Essential (primary) hypertension: Secondary | ICD-10-CM

## 2023-04-20 LAB — ECHOCARDIOGRAM COMPLETE
Area-P 1/2: 3.5 cm2
P 1/2 time: 511 msec
S' Lateral: 2.7 cm

## 2023-04-21 ENCOUNTER — Other Ambulatory Visit: Payer: Self-pay

## 2023-04-21 MED ORDER — FUROSEMIDE 20 MG PO TABS: 20.0000 mg | ORAL_TABLET | Freq: Every day | ORAL | 3 refills | Status: DC

## 2023-04-23 ENCOUNTER — Encounter: Payer: Self-pay | Admitting: Cardiology

## 2023-04-30 ENCOUNTER — Telehealth: Payer: Self-pay | Admitting: Cardiology

## 2023-04-30 MED ORDER — FUROSEMIDE 20 MG PO TABS: 40.0000 mg | ORAL_TABLET | ORAL | 3 refills | Status: DC

## 2023-04-30 NOTE — Telephone Encounter (Signed)
Recommendations reviewed with [name] per DPR as per Dr. Hulen Shouts note. [name] verbalized understanding and had no additional questions.

## 2023-04-30 NOTE — Telephone Encounter (Signed)
Pt c/o medication issue:  1. Name of Medication: furosemide (LASIX) 20 MG tablet   2. How are you currently taking this medication (dosage and times per day)?   Take 1 tablet (20 mg total) by mouth daily.    3. Are you having a reaction (difficulty breathing--STAT)? No  4. What is your medication issue? Pt's daughter states that medication is causing pt to have diarrhea to the point she can't make it to the bathroom in time. She would like to know if there is something else that is able to be prescribed. She would like a callback regarding this matter. Please advise

## 2023-04-30 NOTE — Telephone Encounter (Signed)
Pt started taking daily on 04/21/23. [name] per DPR states this happened the last time she was on a diuretic.

## 2023-04-30 NOTE — Telephone Encounter (Signed)
Please call and clarify. This is not a new medication for patient and she only takes it as needed which per last OV note is very infrequent.

## 2023-04-30 NOTE — Addendum Note (Signed)
Addended by: Eleonore Chiquito on: 04/30/2023 04:59 PM   Modules accepted: Orders

## 2023-05-04 NOTE — Progress Notes (Signed)
Cardiology Office Note:    Date:  05/05/2023   ID:  Denise, Snyder 09/04/1941, MRN [id number]  PCP:  Street, Denise Coup, MD   [facility] Providers Cardiologist:  Norman Herrlich, MD     Referring MD: [address] [name], *   CC: follow up post cardioversion  [name] is a 82 y.o. female with a hx of non-obstructive CAD, hypertension, atrial fibrillation on Xarelto, NICM felt to be secondary to tachyarrythmia, systolic heart failure, GERD, Barrett's esophagus, depression, anxiety, AML.   [facility] 10/24/2021 revealed mild diffuse nonobstructive coronary artery disease, mid LAD lesion 40% stenosed.  Noted to have NICM felt to be tachycardia mediated.  Echo in May 2023 revealed an EF 50 to 55%, impaired relaxation, mild aortic sclerosis without stenosis, trace MR.   She was admitted to [facility] on 10/01/2022 to 10/07/2022 s/p fall resulting in closed right hip fracture.  She developed A-fib with RVR in the postoperative.  However with beta-blocker and digoxin her rates improved.  Most recently she was evaluated by Dr. Dulce Sellar on 03/21/2023, she was in atrial fibrillation, highly symptomatic.  He recently wore a monitor that revealed she was in atrial fibrillation 100% of the time.  Recommendations to proceed with a DCCV.  She underwent cardioversion on 04/10/2023 initially at 150 J converting back to sinus rhythm which lasted for about 45 seconds atrial flutter, requiring a second cardioversion at 200 J with restoration of normal sinus rhythm.  She presents today accompanied by her daughter following her cardioversion.  She is feeling better, energy continues to improve.  She offers no cardiac complaints today. She denies chest pain, palpitations, dyspnea, pnd, orthopnea, n, v, dizziness, syncope, edema, weight gain, or early satiety.   Past Medical History:  Diagnosis Date   AML (acute myelogenous leukemia) (HCC)    Anxiety    Atrial fibrillation (HCC)     Barrett's esophagus    Basal cell carcinoma of vulva (HCC)    Depression    Endometrial polyp    "polyps" per intake sheet   GERD (gastroesophageal reflux disease)    Hemorrhoids    Hypertension    Spinal stenosis    Weakness     Past Surgical History:  Procedure Laterality Date   CATARACT EXTRACTION, BILATERAL     august/september 2017   cataract surgery     CERVICAL POLYPECTOMY N/A 08/14/2015   Procedure: CERVICAL POLYPECTOMY;  Surgeon: Tracey Harries, MD;  Location: [facility];  Service: Gynecology;  Laterality: N/A;   CHOLECYSTECTOMY     endometrial polyp surgery     HEMORROIDECTOMY     HYSTEROSCOPY WITH D & C N/A 08/14/2015   Procedure: DILATATION AND CURETTAGE /HYSTEROSCOPY;  Surgeon: Tracey Harries, MD;  Location: [facility];  Service: Gynecology;  Laterality: N/A;   KNEE SURGERY Right    PORTACATH PLACEMENT     RIGHT/LEFT HEART CATH AND CORONARY ANGIOGRAPHY N/A 10/24/2021   Procedure: RIGHT/LEFT HEART CATH AND CORONARY ANGIOGRAPHY;  Surgeon: Tonny Bollman, MD;  Location: [facility] INVASIVE CV LAB;  Service: Cardiovascular;  Laterality: N/A;   TUNNELED VENOUS CATHETER PLACEMENT      Current Medications: Current Meds  Medication Sig   Cholecalciferol (VITAMIN D) 125 MCG (5000 UT) CAPS Take 5,000 Units by mouth daily.   diazepam (VALIUM) 10 MG tablet Take 10 mg by mouth 2 (two) times daily. Takes an additional tablet if needed in the afternoon   digoxin (LANOXIN) 0.125 MG tablet Take 1 tablet (125 mcg total) by  mouth daily.   furosemide (LASIX) 20 MG tablet Take 2 tablets (40 mg total) by mouth every other day.   labetalol (NORMODYNE) 100 MG tablet Take 100 mg by mouth 2 (two) times daily.   pantoprazole (PROTONIX) 40 MG tablet Take 1 tablet (40 mg total) by mouth daily.   rivaroxaban (XARELTO) 20 MG TABS tablet Take 1 tablet (20 mg total) by mouth daily with supper.   venlafaxine XR (EFFEXOR XR) 37.5 MG 24 hr capsule Take 1 capsule (37.5 mg total) by mouth daily with breakfast. Please  follow-up with PCP for further refills.   venlafaxine XR (EFFEXOR-XR) 150 MG 24 hr capsule Take 150 mg by mouth daily.   vitamin B-12 (CYANOCOBALAMIN) 1000 MCG tablet Take 1,000 mcg by mouth daily.   Zinc 50 MG TABS Take 50 mg by mouth daily.     Allergies:   Entresto [sacubitril-valsartan], Compazine [prochlorperazine edisylate], Cortisone, Eliquis [apixaban], Prednisone, and Prochlorperazine   Social History   Socioeconomic History   Marital status: Married    Spouse name: [name]   Number of children: 1   Years of education: Not on file   Highest education level: High school graduate  Occupational History   Occupation: retired  Tobacco Use   Smoking status: Never    Passive exposure: Never   Smokeless tobacco: Never  Vaping Use   Vaping Use: Never used  Substance and Sexual Activity   Alcohol use: No   Drug use: No   Sexual activity: Not on file  Other Topics Concern   Not on file  Social History Narrative   Lives at home with husband   Right handed   Caffeine: quit years ago   Social Determinants of Health   Financial Resource Snyder: Low Risk  (10/26/2021)   Overall Financial Resource Snyder (CARDIA)    Difficulty of Paying Living Expenses: Not hard at all  Food Insecurity: No Food Insecurity (10/26/2021)   Hunger Vital Sign    Worried About Running Out of Food in the Last Year: Never true    Ran Out of Food in the Last Year: Never true  Transportation Needs: No Transportation Needs (10/26/2021)   PRAPARE - Administrator, Civil Service (Medical): No    Lack of Transportation (Non-Medical): No  Physical Activity: Not on file  Stress: Not on file  Social Connections: Not on file     Family History: The patient's family history includes Alzheimer's disease in her brother; Breast cancer in her paternal aunt; CVA in her mother; Diabetes in her mother; Heart attack in her brother and mother; Heart disease in her brother and mother; Lymphoma in her  father; Non-Hodgkin's lymphoma in her father. There is no history of Colon cancer.  ROS:   Please see the history of present illness.     All other systems reviewed and are negative.  EKGs/Labs/Other Studies Reviewed:    The following studies were reviewed today: Cardiac Studies & Procedures   CARDIAC CATHETERIZATION  CARDIAC CATHETERIZATION 10/24/2021  Narrative   Mid LAD lesion is 40% stenosed.  1.  Mild diffuse nonobstructive coronary plaquing. 2.  Essentially normal intracardiac filling pressures with wedge pressure of 13 mmHg, PA pressure 25/9 with a mean of 16 mmHg, and preserved cardiac output of 7.4 L/min  Recommend: Patient appears to have nonischemic cardiomyopathy, likely tachycardia-mediated. Patient remains in atrial fibrillation with RVR.  I do not think her blood pressure will tolerate aggressive beta-blocker titration.  Calcium channel blockers are contraindicated  in the setting of her severe cardiomyopathy.  Would start IV amiodarone and consider TEE/cardioversion per primary team.  We will start on IV heparin post procedure with plans to transition to oral anticoagulation per primary team.  Findings Coronary Findings Diagnostic  Dominance: Right  Left Main The vessel exhibits minimal luminal irregularities.  Left Anterior Descending There is mild diffuse disease throughout the vessel. Mild diffuse plaquing in the LAD, focal 40 to 50% lesion in the mid LAD noted.  There is no obstructive disease throughout the LAD or its diagonal branches. Mid LAD lesion is 40% stenosed.  Left Circumflex The vessel exhibits minimal luminal irregularities.  Right Coronary Artery There is mild diffuse disease throughout the vessel. Large, dominant RCA.  Diffuse irregularity throughout.  The PDA and PLA branches are patent with no significant stenoses.  Intervention  No interventions have been documented.   STRESS TESTS  MYOCARDIAL PERFUSION IMAGING  02/08/2016  Narrative  Nuclear stress EF: 62%.  There was no ST segment deviation noted during stress.  The study is normal. no ecvidence of ischemia.  This is a low risk study.   ECHOCARDIOGRAM  ECHOCARDIOGRAM COMPLETE 04/20/2023  Narrative ECHOCARDIOGRAM REPORT    Patient Name:   [name] Date of Exam: 04/18/2023 Medical Rec #:  [id number]      Height:       68.0 in Accession #:    [id number]     Weight:       147.0 lb Date of Birth:  18-Oct-1941      BSA:          1.793 m Patient Age:    82 years       BP:           90/60 mmHg Patient Gender: F              HR:           79 bpm. Exam Location:  [place]  Procedure: 2D Echo, Color Doppler, Cardiac Doppler and Snyder Analysis  Indications:    PAF (paroxysmal atrial fibrillation) (HCC) [I48.0 (ICD-10-CM)]; Chronic anticoagulation [Z79.01 (ICD-10-CM)]; NICM (nonischemic cardiomyopathy) (HCC) [I42.8 (ICD-10-CM)]; Mild CAD [I25.10 (ICD-10-CM)]; Essential hypertension [I10 (ICD-10-CM)]; Hypercholesterolemia [E78.00 (ICD-10-CM)]  History:        Patient has prior history of Echocardiogram examinations, most recent 02/18/2022. Arrythmias:Atrial Fibrillation, Signs/Symptoms:Dyspnea and Fatigue; Risk Factors:Hypertension.  Sonographer:    Margreta Journey RDCS Referring Phys: [id number] BRIAN J MUNLEY  IMPRESSIONS   1. Left ventricular ejection fraction, by estimation, is 55 to 60%. The left ventricle has normal function. The left ventricle has no regional wall motion abnormalities. Left ventricular diastolic parameters are consistent with Grade II diastolic dysfunction (pseudonormalization). Elevated left atrial pressure. The average left ventricular global longitudinal Snyder is -12.9 %. The global longitudinal Snyder is abnormal. 2. Right ventricular systolic function is normal. The right ventricular size is normal. There is normal pulmonary artery systolic pressure. 3. Left atrial size was mildly dilated. 4. The mitral  valve is degenerative. No evidence of mitral valve regurgitation. No evidence of mitral stenosis. 5. The aortic valve is tricuspid. Aortic valve regurgitation is mild. Aortic valve sclerosis is present, with no evidence of aortic valve stenosis. 6. Aortic Normal DTA. 7. The inferior vena cava is normal in size with greater than 50% respiratory variability, suggesting right atrial pressure of 3 mmHg.  FINDINGS Left Ventricle: Left ventricular ejection fraction, by estimation, is 55 to 60%. The left ventricle has normal function. The left ventricle has  no regional wall motion abnormalities. The average left ventricular global longitudinal Snyder is -12.9 %. The global longitudinal Snyder is abnormal. The left ventricular internal cavity size was normal in size. There is no left ventricular hypertrophy. Left ventricular diastolic parameters are consistent with Grade II diastolic dysfunction (pseudonormalization). Elevated left atrial pressure.  Right Ventricle: The right ventricular size is normal. No increase in right ventricular wall thickness. Right ventricular systolic function is normal. There is normal pulmonary artery systolic pressure. The tricuspid regurgitant velocity is 2.27 m/s, and with an assumed right atrial pressure of 3 mmHg, the estimated right ventricular systolic pressure is 23.6 mmHg.  Left Atrium: Left atrial size was mildly dilated.  Right Atrium: Right atrial size was normal in size.  Pericardium: There is no evidence of pericardial effusion.  Mitral Valve: The mitral valve is degenerative in appearance. Mild mitral annular calcification. No evidence of mitral valve regurgitation. No evidence of mitral valve stenosis.  Tricuspid Valve: The tricuspid valve is normal in structure. Tricuspid valve regurgitation is mild . No evidence of tricuspid stenosis.  Aortic Valve: The aortic valve is tricuspid. Aortic valve regurgitation is mild. Aortic regurgitation PHT measures 511  msec. Aortic valve sclerosis is present, with no evidence of aortic valve stenosis.  Pulmonic Valve: The pulmonic valve was normal in structure. Pulmonic valve regurgitation is not visualized. No evidence of pulmonic stenosis.  Aorta: The aortic arch was not well visualized, the aortic root and ascending aorta are structurally normal, with no evidence of dilitation and Normal DTA.  Venous: The pulmonary veins were not well visualized. The inferior vena cava is normal in size with greater than 50% respiratory variability, suggesting right atrial pressure of 3 mmHg.  IAS/Shunts: No atrial level shunt detected by color flow Doppler.   LEFT VENTRICLE PLAX 2D LVIDd:         4.20 cm   Diastology LVIDs:         2.70 cm   LV e' medial:    5.33 cm/s LV PW:         0.80 cm   LV E/e' medial:  13.6 LV IVS:        0.80 cm   LV e' lateral:   8.59 cm/s LVOT diam:     2.00 cm   LV E/e' lateral: 8.4 LV SV:         52 LV SV Index:   29        2D Longitudinal Snyder LVOT Area:     3.14 cm  2D Snyder GLS Avg:     -12.9 %   RIGHT VENTRICLE RV Basal diam:  3.00 cm RV Mid diam:    2.35 cm RV S prime:     13.60 cm/s TAPSE (M-mode): 2.4 cm  LEFT ATRIUM             Index        RIGHT ATRIUM           Index LA diam:        4.50 cm 2.51 cm/m   RA Area:     17.00 cm LA Vol (A2C):   47.7 ml 26.60 ml/m  RA Volume:   43.30 ml  24.15 ml/m LA Vol (A4C):   93.0 ml 51.87 ml/m LA Biplane Vol: 68.0 ml 37.92 ml/m AORTIC VALVE LVOT Vmax:   80.00 cm/s LVOT Vmean:  52.433 cm/s LVOT VTI:    0.166 m AI PHT:      511 msec  AORTA Ao  Root diam: 3.00 cm Ao Asc diam:  3.30 cm Ao Desc diam: 1.50 cm  MITRAL VALVE               TRICUSPID VALVE MV Area (PHT): 3.50 cm    TR Peak grad:   20.6 mmHg MV Decel Time: 217 msec    TR Vmax:        227.00 cm/s MV E velocity: 72.30 cm/s MV A velocity: 54.50 cm/s  SHUNTS MV E/A ratio:  1.33        Systemic VTI:  0.17 m Systemic Diam: 2.00 cm  Norman Herrlich  MD Electronically signed by Norman Herrlich MD Signature Date/Time: 04/20/2023/7:30:39 PM    Final    MONITORS  LONG TERM MONITOR (3-14 DAYS) 03/20/2023  Narrative Patch Wear Time:  8 days and 9 hours (2024-04-22T16:55:07-399 to 2024-05-01T02:42:18-0400)  Atrial Fibrillation occurred continuously (100% burden), ranging from 40-143 bpm (avg of 75 bpm). Isolated VEs were rare (<1.0%, 6185), and no VE Couplets or VE Triplets were present.  Heart rate response with atrial fibrillation is 50 to 110 bpm 99% of the time daytime 100% of the time nighttime.  There were no symptomatic events noted  There were no pauses of 3 seconds or greater.  Ventricular ectopy was rare.            EKG:  EKG Interpretation Date/Time:  Monday May 05 2023 15:04:03 EDT Ventricular Rate:  77 PR Interval:  218 QRS Duration:  102 QT Interval:  376 QTC Calculation: 425 R Axis:   28  Text Interpretation: Sinus rhythm with 1st degree A-V block When compared with ECG of 05-Nov-2021 15:38, No significant change was found Confirmed by Wallis Bamberg [phone]) on 05/05/2023 3:58:04 PM    Recent Labs: 02/27/2023: ALT 11; TSH 1.470 03/21/2023: BUN 12; Creatinine, Ser 0.88; Hemoglobin 12.0; Platelets 185; Potassium 4.3; Sodium 140  Recent Lipid Panel No results found for: "CHOL", "TRIG", "HDL", "CHOLHDL", "VLDL", "LDLCALC", "LDLDIRECT"   Risk Assessment/Calculations:    CHA2DS2-VASc Score = 6   This indicates a 9.7% annual risk of stroke. The patient's score is based upon: CHF History: 1 HTN History: 1 Diabetes History: 0 Stroke History: 0 Vascular Disease History: 1 Age Score: 2 Gender Score: 1               Physical Exam:    VS:  BP 118/80 (BP Location: Right Arm, Patient Position: Sitting, Cuff Size: Normal)   Pulse 77   Ht 5\' 8"  (1.727 m)   Wt 144 lb (65.3 kg)   SpO2 94%   BMI 21.90 kg/m     Wt Readings from Last 3 Encounters:  05/05/23 144 lb (65.3 kg)  04/04/23 147 lb (66.7 kg)   03/21/23 147 lb (66.7 kg)     GEN:  Well nourished, well developed in no acute distress HEENT: Normal NECK: No JVD; No carotid bruits LYMPHATICS: No lymphadenopathy CARDIAC: Regularly irregular, no murmurs, rubs, gallops RESPIRATORY:  Clear to auscultation without rales, wheezing or rhonchi  ABDOMEN: Soft, non-tender, non-distended MUSCULOSKELETAL:  No edema; No deformity  SKIN: Warm and dry NEUROLOGIC:  Alert and oriented x 3 PSYCHIATRIC:  Normal affect   ASSESSMENT:    1. PAF (paroxysmal atrial fibrillation) (HCC)   2. Mild CAD     PLAN:    In order of problems listed above:   PAF/chronic anticoagulation -she is in sinus rhythm today, first-degree AV block.  CHADSVasc score of 7.  Continue Xarelto 20 mg daily--currently no indication  for reduced dosing CrCl 100, labetalol 100 mg twice a day, continue digoxin 125 mcg every day.  They have plans to purchase a watch and begin to monitor her heart rate at home for recurrence of atrial fibrillation.  CAD -mild diffuse nonobstructive coronary plaquing on [facility] December 2022, Stable with no anginal symptoms. No indication for ischemic evaluation.  Continue labetalol 100 mg twice a day, currently anticoagulated on Xarelto.   NICM/HFrEF with improved EF -most recent echo in May 2023 revealed EF 50 to 55%, NYHA class II--likely d/t deconditioning, euvolemic.  Continue labetalol per above, has Lasix as needed.   HTN -blood pressure today is well-controlled at 118/80, continue labetalol 100 mg twice a day.  Disposition-return in 2 to 3 months.       Medication Adjustments/Labs and Tests Ordered: Current medicines are reviewed at length with the patient today.  Concerns regarding medicines are outlined above.  Orders Placed This Encounter  Procedures   EKG 12-Lead   No orders of the defined types were placed in this encounter.   Patient Instructions  Medication Instructions:  Your physician recommends that you continue on your  current medications as directed. Please refer to the Current Medication list given to you today.  *If you need a refill on your cardiac medications before your next appointment, please call your pharmacy*   Lab Work: NONE If you have labs (blood work) drawn today and your tests are completely normal, you will receive your results only by: MyChart Message (if you have MyChart) OR A paper copy in the mail If you have any lab test that is abnormal or we need to change your treatment, we will call you to review the results.   Testing/Procedures: NONE  Follow-Up: At [facility], you and your health needs are our priority.  As part of our continuing mission to provide you with exceptional heart care, we have created designated Provider Care Teams.  These Care Teams include your primary Cardiologist (physician) and Advanced Practice Providers (APPs -  Physician Assistants and Nurse Practitioners) who all work together to provide you with the care you need, when you need it.  We recommend signing up for the patient portal called "MyChart".  Sign up information is provided on this After Visit Summary.  MyChart is used to connect with patients for Virtual Visits (Telemedicine).  Patients are able to view lab/test results, encounter notes, upcoming appointments, etc.  Non-urgent messages can be sent to your provider as well.   To learn more about what you can do with MyChart, go to [email or web address].    Your next appointment:   2-3  month(s)  Provider:   Norman Herrlich, MD    Other Instructions     Signed, Flossie Dibble, NP  05/05/2023 3:59 PM    [facility]

## 2023-05-05 ENCOUNTER — Ambulatory Visit: Payer: Medicare HMO | Attending: Cardiology | Admitting: Cardiology

## 2023-05-05 VITALS — BP 118/80 | HR 77 | Ht 68.0 in | Wt 144.0 lb

## 2023-05-05 DIAGNOSIS — I5020 Unspecified systolic (congestive) heart failure: Secondary | ICD-10-CM

## 2023-05-05 DIAGNOSIS — I10 Essential (primary) hypertension: Secondary | ICD-10-CM | POA: Diagnosis not present

## 2023-05-05 DIAGNOSIS — I2510 Atherosclerotic heart disease of native coronary artery without angina pectoris: Secondary | ICD-10-CM | POA: Diagnosis not present

## 2023-05-05 DIAGNOSIS — I480 Paroxysmal atrial fibrillation: Secondary | ICD-10-CM

## 2023-05-05 NOTE — Patient Instructions (Addendum)
Medication Instructions:  Your physician recommends that you continue on your current medications as directed. Please refer to the Current Medication list given to you today.  *If you need a refill on your cardiac medications before your next appointment, please call your pharmacy*   Lab Work: NONE If you have labs (blood work) drawn today and your tests are completely normal, you will receive your results only by: MyChart Message (if you have MyChart) OR A paper copy in the mail If you have any lab test that is abnormal or we need to change your treatment, we will call you to review the results.   Testing/Procedures: NONE  Follow-Up: At [facility], you and your health needs are our priority.  As part of our continuing mission to provide you with exceptional heart care, we have created designated Provider Care Teams.  These Care Teams include your primary Cardiologist (physician) and Advanced Practice Providers (APPs -  Physician Assistants and Nurse Practitioners) who all work together to provide you with the care you need, when you need it.  We recommend signing up for the patient portal called "MyChart".  Sign up information is provided on this After Visit Summary.  MyChart is used to connect with patients for Virtual Visits (Telemedicine).  Patients are able to view lab/test results, encounter notes, upcoming appointments, etc.  Non-urgent messages can be sent to your provider as well.   To learn more about what you can do with MyChart, go to [email or web address].    Your next appointment:   2-3  month(s)  Provider:   Norman Herrlich, MD    Other Instructions

## 2023-05-09 ENCOUNTER — Telehealth: Payer: Self-pay | Admitting: Cardiology

## 2023-05-09 ENCOUNTER — Telehealth: Payer: Self-pay

## 2023-05-09 DIAGNOSIS — I4891 Unspecified atrial fibrillation: Secondary | ICD-10-CM

## 2023-05-09 NOTE — Telephone Encounter (Signed)
Pt c/o medication issue:  1. Name of Medication:   digoxin (LANOXIN) 0.125 MG tablet    2. How are you currently taking this medication (dosage and times per day)?   Take 1 tablet (125 mcg total) by mouth daily.    3. Are you having a reaction (difficulty breathing--STAT)? No  4. What is your medication issue? Patient's daughter is calling because this morning the patient felt dizzy and felt like they were going to pass out. Per daughter, patient went to lay down and they checked the patient's BP. The patient's BP was 99/61. Per patient's daughter, the patient had an issue before with this medication causing her BP to drop. The patient's daughter is concerned that this medication may be the cause for the patient symptoms.

## 2023-05-09 NOTE — Telephone Encounter (Signed)
Spoke with pt's daughter Zella Ball per DPR. Advised per Jimmy Footman note to make sure pt is well hydrated and send BP and pulse readings. Her pulse this morning was 65. She will also have a digoxin level one morning. Daughter verbalized understanding and had no further questions.

## 2023-06-01 ENCOUNTER — Other Ambulatory Visit: Payer: Self-pay | Admitting: Cardiology

## 2023-06-05 DIAGNOSIS — E785 Hyperlipidemia, unspecified: Secondary | ICD-10-CM | POA: Diagnosis not present

## 2023-06-05 DIAGNOSIS — I4891 Unspecified atrial fibrillation: Secondary | ICD-10-CM | POA: Diagnosis not present

## 2023-06-05 DIAGNOSIS — I428 Other cardiomyopathies: Secondary | ICD-10-CM | POA: Diagnosis not present

## 2023-06-05 DIAGNOSIS — I10 Essential (primary) hypertension: Secondary | ICD-10-CM | POA: Diagnosis not present

## 2023-07-10 ENCOUNTER — Telehealth: Payer: Self-pay | Admitting: Cardiology

## 2023-07-10 ENCOUNTER — Other Ambulatory Visit: Payer: Self-pay

## 2023-07-10 NOTE — Telephone Encounter (Signed)
Zella Ball the patient's daughter returned the previous call and reported that the patient's heart rate was starting to run in the 50's and she did not know if she should have the patient continue to take her Digoxin or if she should stop taking it. Today's heart rate is 54 and the patient is feeling bad. She also reported that the patient was discharged from the hospital last Friday after she had Covid. Please advise

## 2023-07-10 NOTE — Telephone Encounter (Signed)
Left message for [name] to call back

## 2023-07-10 NOTE — Telephone Encounter (Signed)
Pt c/o medication issue:  1. Name of Medication:   digoxin (LANOXIN) 0.125 MG tablet    2. How are you currently taking this medication (dosage and times per day)?  Take 1 tablet (125 mcg total) by mouth daily.       3. Are you having a reaction (difficulty breathing--STAT)? No  4. What is your medication issue? Pt's daughter is requesting a callback at [phone] to see if pt should still take this medication. Please advise

## 2023-07-11 ENCOUNTER — Ambulatory Visit: Payer: Medicare HMO | Attending: Cardiology | Admitting: Cardiology

## 2023-07-11 ENCOUNTER — Ambulatory Visit: Payer: Medicare HMO | Attending: Cardiology

## 2023-07-11 VITALS — BP 118/60 | HR 68 | Ht 68.0 in | Wt 136.0 lb

## 2023-07-11 DIAGNOSIS — I4891 Unspecified atrial fibrillation: Secondary | ICD-10-CM

## 2023-07-11 DIAGNOSIS — I10 Essential (primary) hypertension: Secondary | ICD-10-CM | POA: Diagnosis not present

## 2023-07-11 DIAGNOSIS — R000 Tachycardia, unspecified: Secondary | ICD-10-CM | POA: Diagnosis not present

## 2023-07-11 MED ORDER — DILTIAZEM HCL 60 MG PO TABS: 60.0000 mg | ORAL_TABLET | Freq: Every day | ORAL | 3 refills | Status: DC

## 2023-07-11 NOTE — Progress Notes (Signed)
Cardiology Office Note:    Date:  07/11/2023   ID:  [name], [name] [birth year], MRN [id number]  PCP:  Street, Stephanie Coup, MD  Cardiologist:  Garwin Brothers, MD   Referring MD: [address], Stephanie Coup, *    ASSESSMENT:    1. Atrial fibrillation, unspecified type (HCC)   2. Sinus tachycardia   3. Essential hypertension    PLAN:    In order of problems listed above:  Primary prevention stressed with the patient.  Importance of compliance with diet medication stressed and patient verbalized standing. Paroxysmal atrial fibrillation: Today her heart rate is elevated.  This could be possibly sinus tachycardia or atrial flutter.  At any rate I have asked to see if she has any dark stools or any such issues to see if she could be anemic or bleeding and she tells me that she does not have any of such issues.  I have added low-dose Cardizem to her regimen.  Will put a 2-week monitor to understand what is going on with her heart rates overall.  Time.  Will also do a Chem-7 CBC and TSH today. Essential hypertension: Blood pressure stable and diet was emphasized. Atrial fibrillation: Anticoagulation benefits and potential risks explained and questions were answered to her satisfaction. She has an appointment coming up with Dr. Dulce Sellar on Tuesday and follow-up will be by Dr. Dulce Sellar.  I also reviewed [facility] prior evaluation at length. History of cardiomyopathy: Stable.  Last ejection fraction was preserved.   Medication Adjustments/Labs and Tests Ordered: Current medicines are reviewed at length with the patient today.  Concerns regarding medicines are outlined above.  Orders Placed This Encounter  Procedures   EKG 12-Lead   No orders of the defined types were placed in this encounter.    No chief complaint on file.    History of Present Illness:    [name] is a 82 y.o. female.  Patient has past medical history of paroxysmal atrial fibrillation, essential  hypertension.  She called her office today mentioning that her heart rate was up.  I reviewed her records and brought her in to see her for a quick review.  Past Medical History:  Diagnosis Date   AML (acute myelogenous leukemia) (HCC)    Anxiety    Atrial fibrillation (HCC)    Barrett's esophagus    Basal cell carcinoma of vulva (HCC)    Depression    Endometrial polyp    "polyps" per intake sheet   GERD (gastroesophageal reflux disease)    Hemorrhoids    Hypertension    Spinal stenosis    Weakness     Past Surgical History:  Procedure Laterality Date   CATARACT EXTRACTION, BILATERAL     august/september 2017   cataract surgery     CERVICAL POLYPECTOMY N/A 08/14/2015   Procedure: CERVICAL POLYPECTOMY;  Surgeon: Tracey Harries, MD;  Location: [facility];  Service: Gynecology;  Laterality: N/A;   CHOLECYSTECTOMY     endometrial polyp surgery     HEMORROIDECTOMY     HYSTEROSCOPY WITH D & C N/A 08/14/2015   Procedure: DILATATION AND CURETTAGE /HYSTEROSCOPY;  Surgeon: Tracey Harries, MD;  Location: [facility];  Service: Gynecology;  Laterality: N/A;   KNEE SURGERY Right    PORTACATH PLACEMENT     RIGHT/LEFT HEART CATH AND CORONARY ANGIOGRAPHY N/A 10/24/2021   Procedure: RIGHT/LEFT HEART CATH AND CORONARY ANGIOGRAPHY;  Surgeon: Tonny Bollman, MD;  Location: [facility] INVASIVE CV LAB;  Service: Cardiovascular;  Laterality: N/A;  TUNNELED VENOUS CATHETER PLACEMENT      Current Medications: Current Meds  Medication Sig   Cholecalciferol (VITAMIN D) 125 MCG (5000 UT) CAPS Take 5,000 Units by mouth daily.   diazepam (VALIUM) 10 MG tablet Take 10 mg by mouth 2 (two) times daily. Takes an additional tablet if needed in the afternoon   digoxin (LANOXIN) 0.125 MG tablet Take 1 tablet (125 mcg total) by mouth daily.   furosemide (LASIX) 20 MG tablet Take 2 tablets (40 mg total) by mouth every other day.   labetalol (NORMODYNE) 100 MG tablet Take 100 mg by mouth 2 (two) times daily.   pantoprazole  (PROTONIX) 40 MG tablet Take 1 tablet (40 mg total) by mouth daily.   rivaroxaban (XARELTO) 20 MG TABS tablet Take 1 tablet (20 mg total) by mouth daily with supper.   venlafaxine XR (EFFEXOR XR) 37.5 MG 24 hr capsule Take 1 capsule (37.5 mg total) by mouth daily with breakfast. Please follow-up with PCP for further refills.   venlafaxine XR (EFFEXOR-XR) 150 MG 24 hr capsule Take 150 mg by mouth daily.   vitamin B-12 (CYANOCOBALAMIN) 1000 MCG tablet Take 1,000 mcg by mouth daily.   Zinc 50 MG TABS Take 50 mg by mouth daily.     Allergies:   Entresto [sacubitril-valsartan], Compazine [prochlorperazine edisylate], Cortisone, Eliquis [apixaban], Prednisone, and Prochlorperazine   Social History   Socioeconomic History   Marital status: Married    Spouse name: [name]   Number of children: 1   Years of education: Not on file   Highest education level: High school graduate  Occupational History   Occupation: retired  Tobacco Use   Smoking status: Never    Passive exposure: Never   Smokeless tobacco: Never  Vaping Use   Vaping status: Never Used  Substance and Sexual Activity   Alcohol use: No   Drug use: No   Sexual activity: Not on file  Other Topics Concern   Not on file  Social History Narrative   Lives at home with husband   Right handed   Caffeine: quit years ago   Social Determinants of Health   Financial Resource Strain: Low Risk  (10/26/2021)   Overall Financial Resource Strain (CARDIA)    Difficulty of Paying Living Expenses: Not hard at all  Food Insecurity: No Food Insecurity (10/26/2021)   Hunger Vital Sign    Worried About Running Out of Food in the Last Year: Never true    Ran Out of Food in the Last Year: Never true  Transportation Needs: No Transportation Needs (10/26/2021)   PRAPARE - Administrator, Civil Service (Medical): No    Lack of Transportation (Non-Medical): No  Physical Activity: Not on file  Stress: Not on file  Social  Connections: Not on file     Family History: The patient's family history includes Alzheimer's disease in her brother; Breast cancer in her paternal aunt; CVA in her mother; Diabetes in her mother; Heart attack in her brother and mother; Heart disease in her brother and mother; Lymphoma in her father; Non-Hodgkin's lymphoma in her father. There is no history of Colon cancer.  ROS:   Please see the history of present illness.    All other systems reviewed and are negative.  EKGs/Labs/Other Studies Reviewed:    The following studies were reviewed today: EKG reveals narrow complex tachycardia with elevated heart rate.   Recent Labs: 02/27/2023: ALT 11; TSH 1.470 03/21/2023: BUN 12; Creatinine, Ser 0.88; Hemoglobin  12.0; Platelets 185; Potassium 4.3; Sodium 140  Recent Lipid Panel No results found for: "CHOL", "TRIG", "HDL", "CHOLHDL", "VLDL", "LDLCALC", "LDLDIRECT"  Physical Exam:    VS:  BP 118/60 (BP Location: Left Arm, Patient Position: Sitting, Cuff Size: Normal)   Pulse 68   Ht 5\' 8"  (1.727 m)   Wt 136 lb (61.7 kg)   SpO2 98%   BMI 20.68 kg/m     Wt Readings from Last 3 Encounters:  07/11/23 136 lb (61.7 kg)  05/05/23 144 lb (65.3 kg)  04/04/23 147 lb (66.7 kg)     GEN: Patient is in no acute distress HEENT: Normal NECK: No JVD; No carotid bruits LYMPHATICS: No lymphadenopathy CARDIAC: Hear sounds regular, 2/6 systolic murmur at the apex. RESPIRATORY:  Clear to auscultation without rales, wheezing or rhonchi  ABDOMEN: Soft, non-tender, non-distended MUSCULOSKELETAL:  No edema; No deformity  SKIN: Warm and dry NEUROLOGIC:  Alert and oriented x 3 PSYCHIATRIC:  Normal affect   Signed, Garwin Brothers, MD  07/11/2023 4:39 PM    [facility]

## 2023-07-11 NOTE — Addendum Note (Signed)
Addended by: Roosvelt Harps R on: 07/11/2023 05:15 PM   Modules accepted: Orders

## 2023-07-11 NOTE — Patient Instructions (Addendum)
Medication Instructions:  Your physician has recommended you make the following change in your medication:  Start CARdizem 60mg  once daily  *If you need a refill on your cardiac medications before your next appointment, please call your pharmacy*   Lab Work:Your physician recommends that you return for lab work in: Monday for BMP, CBC and TSH  If you have labs (blood work) drawn today and your tests are completely normal, you will receive your results only by: MyChart Message (if you have MyChart) OR A paper copy in the mail If you have any lab test that is abnormal or we need to change your treatment, we will call you to review the results.   Testing/Procedures: You have been asked to wear a Zio Heart Monitor today. It is to be worn for 14 days. Please remove the monitor on 9/27 and mail back in the box provided.  If you have any questions about the monitor please call the company at [phone]     Follow-Up: At [facility], you and your health needs are our priority.  As part of our continuing mission to provide you with exceptional heart care, we have created designated Provider Care Teams.  These Care Teams include your primary Cardiologist (physician) and Advanced Practice Providers (APPs -  Physician Assistants and Nurse Practitioners) who all work together to provide you with the care you need, when you need it.  We recommend signing up for the patient portal called "MyChart".  Sign up information is provided on this After Visit Summary.  MyChart is used to connect with patients for Virtual Visits (Telemedicine).  Patients are able to view lab/test results, encounter notes, upcoming appointments, etc.  Non-urgent messages can be sent to your provider as well.   To learn more about what you can do with MyChart, go to [email or web address].    Your next appointment:  07/18/23 at 4:20    Provider:   Norman Herrlich, MD    Other Instructions

## 2023-07-11 NOTE — Telephone Encounter (Signed)
Called the patient's daughter Zella Ball to inform her of Dr. Vanetta Shawl recommendation below:  "Stop digoxin please"  Zella Ball told me that she was checking the patient's heart rate with three different devices and she was getting different readings with each device and she was not sure which one was accurate. I spoke to Dr. Tomie China and he recommended to bring the patient in for a nurse visit to check her pulse and blood pressure so we have accurate readings. I relayed this information to Zella Ball the patient's daughter and a nurse visit was scheduled for 3:30 pm today. The patient came to the office for the nurse visit and after assessing the patient's vitals Dr. Tomie China asked to have an EKG performed. After interpreting the EKG Dr. Tomie China asked for the patient to be made an office visit instead of a nurse visit. Dr. Tomie China saw the patient and gave orders for blood work, a 2 week zio and Cardizem CD 60 mg daily for the patient. Patient and patients daughter verbalized understanding and had no further questions at this time.

## 2023-07-18 ENCOUNTER — Ambulatory Visit: Payer: Medicare HMO | Attending: Cardiology | Admitting: Cardiology

## 2023-07-18 ENCOUNTER — Encounter: Payer: Self-pay | Admitting: Cardiology

## 2023-07-18 VITALS — BP 108/70 | HR 70 | Ht 68.0 in | Wt 137.0 lb

## 2023-07-18 DIAGNOSIS — Z7901 Long term (current) use of anticoagulants: Secondary | ICD-10-CM | POA: Diagnosis not present

## 2023-07-18 DIAGNOSIS — I428 Other cardiomyopathies: Secondary | ICD-10-CM

## 2023-07-18 DIAGNOSIS — Z79899 Other long term (current) drug therapy: Secondary | ICD-10-CM | POA: Diagnosis not present

## 2023-07-18 DIAGNOSIS — I484 Atypical atrial flutter: Secondary | ICD-10-CM

## 2023-07-18 DIAGNOSIS — I480 Paroxysmal atrial fibrillation: Secondary | ICD-10-CM

## 2023-07-18 DIAGNOSIS — I2510 Atherosclerotic heart disease of native coronary artery without angina pectoris: Secondary | ICD-10-CM

## 2023-07-18 MED ORDER — AMIODARONE HCL 200 MG PO TABS: 200.0000 mg | ORAL_TABLET | Freq: Two times a day (BID) | ORAL | 3 refills | Status: DC

## 2023-07-18 MED ORDER — DILTIAZEM HCL 60 MG PO TABS: 60.0000 mg | ORAL_TABLET | Freq: Every day | ORAL | 3 refills | Status: DC

## 2023-07-18 NOTE — Patient Instructions (Signed)
Medication Instructions:  Your physician has recommended you make the following change in your medication:   STOP: Digoxin START: Amiodarone 200 mg twice daily START: Cardizem 60 mg daily (If HR < 60 bpm do not take Diltiazem)   *If you need a refill on your cardiac medications before your next appointment, please call your pharmacy*   Lab Work: None If you have labs (blood work) drawn today and your tests are completely normal, you will receive your results only by: MyChart Message (if you have MyChart) OR A paper copy in the mail If you have any lab test that is abnormal or we need to change your treatment, we will call you to review the results.   Testing/Procedures: None   Follow-Up: At [facility], you and your health needs are our priority.  As part of our continuing mission to provide you with exceptional heart care, we have created designated Provider Care Teams.  These Care Teams include your primary Cardiologist (physician) and Advanced Practice Providers (APPs -  Physician Assistants and Nurse Practitioners) who all work together to provide you with the care you need, when you need it.  We recommend signing up for the patient portal called "MyChart".  Sign up information is provided on this After Visit Summary.  MyChart is used to connect with patients for Virtual Visits (Telemedicine).  Patients are able to view lab/test results, encounter notes, upcoming appointments, etc.  Non-urgent messages can be sent to your provider as well.   To learn more about what you can do with MyChart, go to [email or web address].    Your next appointment:   4 week(s)  Provider:   Norman Herrlich, MD    Other Instructions Purchase a mobile [email or web address]  Nurse visit in 1 week for EKG

## 2023-07-18 NOTE — Addendum Note (Signed)
Addended by: Roxanne Mins I on: 07/18/2023 04:54 PM   Modules accepted: Orders

## 2023-07-18 NOTE — Progress Notes (Signed)
is a low risk study.   ECHOCARDIOGRAM  ECHOCARDIOGRAM COMPLETE 04/18/2023  Narrative ECHOCARDIOGRAM REPORT    Patient Name:   [name] Date of Exam: 04/18/2023 Medical Rec #:  [id number]      Height:       68.0 in Accession #:    [id number]     Weight:       147.0 lb Date of Birth:  1941/07/12      BSA:          1.793 m Patient Age:    82 years       BP:           90/60 mmHg Patient Gender: F              HR:           79 bpm. Exam Location:  [facility]  Procedure: 2D Echo, Color Doppler, Cardiac Doppler and Strain Analysis  Indications:    PAF (paroxysmal atrial fibrillation) (HCC) [I48.0 (ICD-10-CM)]; Chronic anticoagulation [Z79.01 (ICD-10-CM)]; NICM (nonischemic cardiomyopathy) (HCC) [I42.8 (ICD-10-CM)]; Mild CAD [I25.10 (ICD-10-CM)]; Essential hypertension [I10  (ICD-10-CM)]; Hypercholesterolemia [E78.00 (ICD-10-CM)]  History:        Patient has prior history of Echocardiogram examinations, most recent 02/18/2022. Arrythmias:Atrial Fibrillation, Signs/Symptoms:Dyspnea and Fatigue; Risk Factors:Hypertension.  Sonographer:    Margreta Journey RDCS Referring Phys: [id number] Terrence Pizana J Ceasar Decandia  IMPRESSIONS   1. Left ventricular ejection fraction, by estimation, is 55 to 60%. The left ventricle has normal function. The left ventricle has no regional wall motion abnormalities. Left ventricular diastolic parameters are consistent with Grade II diastolic dysfunction (pseudonormalization). Elevated left atrial pressure. The average left ventricular global longitudinal strain is -12.9 %. The global longitudinal strain is abnormal. 2. Right ventricular systolic function is normal. The right ventricular size is normal. There is normal pulmonary artery systolic pressure. 3. Left atrial size was mildly dilated. 4. The mitral valve is degenerative. No evidence of mitral valve regurgitation. No evidence of mitral stenosis. 5. The aortic valve is tricuspid. Aortic valve regurgitation is mild. Aortic valve sclerosis is present, with no evidence of aortic valve stenosis. 6. Aortic Normal DTA. 7. The inferior vena cava is normal in size with greater than 50% respiratory variability, suggesting right atrial pressure of 3 mmHg.  FINDINGS Left Ventricle: Left ventricular ejection fraction, by estimation, is 55 to 60%. The left ventricle has normal function. The left ventricle has no regional wall motion abnormalities. The average left ventricular global longitudinal strain is -12.9 %. The global longitudinal strain is abnormal. The left ventricular internal cavity size was normal in size. There is no left ventricular hypertrophy. Left ventricular diastolic parameters are consistent with Grade II diastolic dysfunction (pseudonormalization). Elevated left atrial pressure.  Right  Ventricle: The right ventricular size is normal. No increase in right ventricular wall thickness. Right ventricular systolic function is normal. There is normal pulmonary artery systolic pressure. The tricuspid regurgitant velocity is 2.27 m/s, and with an assumed right atrial pressure of 3 mmHg, the estimated right ventricular systolic pressure is 23.6 mmHg.  Left Atrium: Left atrial size was mildly dilated.  Right Atrium: Right atrial size was normal in size.  Pericardium: There is no evidence of pericardial effusion.  Mitral Valve: The mitral valve is degenerative in appearance. Mild mitral annular calcification. No evidence of mitral valve regurgitation. No evidence of mitral valve stenosis.  Tricuspid Valve: The tricuspid valve is normal in structure. Tricuspid valve regurgitation is mild . No evidence of tricuspid stenosis.  Aortic Valve:  is a low risk study.   ECHOCARDIOGRAM  ECHOCARDIOGRAM COMPLETE 04/18/2023  Narrative ECHOCARDIOGRAM REPORT    Patient Name:   [name] Date of Exam: 04/18/2023 Medical Rec #:  [id number]      Height:       68.0 in Accession #:    [id number]     Weight:       147.0 lb Date of Birth:  1941/07/12      BSA:          1.793 m Patient Age:    82 years       BP:           90/60 mmHg Patient Gender: F              HR:           79 bpm. Exam Location:  [facility]  Procedure: 2D Echo, Color Doppler, Cardiac Doppler and Strain Analysis  Indications:    PAF (paroxysmal atrial fibrillation) (HCC) [I48.0 (ICD-10-CM)]; Chronic anticoagulation [Z79.01 (ICD-10-CM)]; NICM (nonischemic cardiomyopathy) (HCC) [I42.8 (ICD-10-CM)]; Mild CAD [I25.10 (ICD-10-CM)]; Essential hypertension [I10  (ICD-10-CM)]; Hypercholesterolemia [E78.00 (ICD-10-CM)]  History:        Patient has prior history of Echocardiogram examinations, most recent 02/18/2022. Arrythmias:Atrial Fibrillation, Signs/Symptoms:Dyspnea and Fatigue; Risk Factors:Hypertension.  Sonographer:    Margreta Journey RDCS Referring Phys: [id number] Terrence Pizana J Ceasar Decandia  IMPRESSIONS   1. Left ventricular ejection fraction, by estimation, is 55 to 60%. The left ventricle has normal function. The left ventricle has no regional wall motion abnormalities. Left ventricular diastolic parameters are consistent with Grade II diastolic dysfunction (pseudonormalization). Elevated left atrial pressure. The average left ventricular global longitudinal strain is -12.9 %. The global longitudinal strain is abnormal. 2. Right ventricular systolic function is normal. The right ventricular size is normal. There is normal pulmonary artery systolic pressure. 3. Left atrial size was mildly dilated. 4. The mitral valve is degenerative. No evidence of mitral valve regurgitation. No evidence of mitral stenosis. 5. The aortic valve is tricuspid. Aortic valve regurgitation is mild. Aortic valve sclerosis is present, with no evidence of aortic valve stenosis. 6. Aortic Normal DTA. 7. The inferior vena cava is normal in size with greater than 50% respiratory variability, suggesting right atrial pressure of 3 mmHg.  FINDINGS Left Ventricle: Left ventricular ejection fraction, by estimation, is 55 to 60%. The left ventricle has normal function. The left ventricle has no regional wall motion abnormalities. The average left ventricular global longitudinal strain is -12.9 %. The global longitudinal strain is abnormal. The left ventricular internal cavity size was normal in size. There is no left ventricular hypertrophy. Left ventricular diastolic parameters are consistent with Grade II diastolic dysfunction (pseudonormalization). Elevated left atrial pressure.  Right  Ventricle: The right ventricular size is normal. No increase in right ventricular wall thickness. Right ventricular systolic function is normal. There is normal pulmonary artery systolic pressure. The tricuspid regurgitant velocity is 2.27 m/s, and with an assumed right atrial pressure of 3 mmHg, the estimated right ventricular systolic pressure is 23.6 mmHg.  Left Atrium: Left atrial size was mildly dilated.  Right Atrium: Right atrial size was normal in size.  Pericardium: There is no evidence of pericardial effusion.  Mitral Valve: The mitral valve is degenerative in appearance. Mild mitral annular calcification. No evidence of mitral valve regurgitation. No evidence of mitral valve stenosis.  Tricuspid Valve: The tricuspid valve is normal in structure. Tricuspid valve regurgitation is mild . No evidence of tricuspid stenosis.  Aortic Valve:  Cardiology Office Note:    Date:  07/18/2023   ID:  [name], [name] [birth year], MRN [id number]  PCP:  Street, Stephanie Coup, MD  Cardiologist:  Norman Herrlich, MD    Referring MD: [address] Stephanie Coup, *    ASSESSMENT:    1. Atypical atrial flutter (HCC)   2. PAF (paroxysmal atrial fibrillation) (HCC)   3. Chronic anticoagulation   4. High risk medication use   5. NICM (nonischemic cardiomyopathy) (HCC)   6. Mild CAD    PLAN:    In order of problems listed above:  Ongoing atrial arrhythmia both atypical atrial flutter the other day obvious atrial fibrillation today and in view of her cardiomyopathy I think we should strive to maintain sinus rhythm I think she is a poor candidate for invasive cardiac interventions In view of cardiomyopathy the best safest medications amiodarone I think we can start as an outpatient 400 mg daily Discontinue digoxin For now continue beta-blocker calcium channel blocker rate control and her anticoagulant Office EKG in 1 week   Next appointment: Follow-up 4 weeks   Medication Adjustments/Labs and Tests Ordered: Current medicines are reviewed at length with the patient today.  Concerns regarding medicines are outlined above.  Orders Placed This Encounter  Procedures   Lipid Profile   CBC   Comp Met (CMET)   TSH+T4F+T3Free   No orders of the defined types were placed in this encounter.    History of Present Illness:    [name] is a 82 y.o. female with a hx of paroxysmal atrial fibrillation with anticoagulation tachycardia associated cardiomyopathy with heart failure and nonobstructive CAD last seen by me 03/21/2023.  Her echocardiogram reported 04/18/2023 showed normal left ventricular size systolic function EF 55 to 60%.  She was seen by my partner in the office 07/11/2019 for with complaints of palpitation and was described as having a narrow complex tachycardia.  She was placed on rate limiting calcium channel blocker and  diagnosed as atrial fibrillation  I reviewed that EKG monitor shows his atypical atrial flutter with 2-1 conduction and a ventricular rate of 130 bpm.  She was admitted to [facility] discharge 07/05/2023 with COVID 19 infection and atrial fibrillation laboratory studies prior to discharge showed hemoglobin 11.5 potassium 3.8 creatinine 0.9 her proBNP level was significantly elevated at 4700 EKG from the hospital confirmed atrial fibrillation rapid response chest x-ray showed no pulmonary infiltrates.  Discharge summary has no mention of being seen by cardiology during the hospitalization and no recommendation to follow-up with cardiology.  Compliance with diet, lifestyle and medications: Yes  Her daughter is present her heart rate seems to be better controlled but she is obviously still in atrial fibrillation Doing better since the calcium channel blocker was added for rate Review of her previous cardiomyopathy I think she will do best with rhythm as opposed to rate control strategy and after discussion with the patient and her daughter regarding initiate outpatient amiodarone for 100 mg daily she will return for 1 week for an office EKG I asked her to purchase the mobile Kardia device for home screening She takes digoxin which is a narrow therapeutic index drug and I think we should stop but.  They agree She will hold her Cardizem if her rates are less than 60 bpm She will continue her anticoagulant She continues to be weak but is not having edema shortness of breath chest pain or syncope She has had no bleeding complication of her anticoagulant  Past  is a low risk study.   ECHOCARDIOGRAM  ECHOCARDIOGRAM COMPLETE 04/18/2023  Narrative ECHOCARDIOGRAM REPORT    Patient Name:   [name] Date of Exam: 04/18/2023 Medical Rec #:  [id number]      Height:       68.0 in Accession #:    [id number]     Weight:       147.0 lb Date of Birth:  1941/07/12      BSA:          1.793 m Patient Age:    82 years       BP:           90/60 mmHg Patient Gender: F              HR:           79 bpm. Exam Location:  [facility]  Procedure: 2D Echo, Color Doppler, Cardiac Doppler and Strain Analysis  Indications:    PAF (paroxysmal atrial fibrillation) (HCC) [I48.0 (ICD-10-CM)]; Chronic anticoagulation [Z79.01 (ICD-10-CM)]; NICM (nonischemic cardiomyopathy) (HCC) [I42.8 (ICD-10-CM)]; Mild CAD [I25.10 (ICD-10-CM)]; Essential hypertension [I10  (ICD-10-CM)]; Hypercholesterolemia [E78.00 (ICD-10-CM)]  History:        Patient has prior history of Echocardiogram examinations, most recent 02/18/2022. Arrythmias:Atrial Fibrillation, Signs/Symptoms:Dyspnea and Fatigue; Risk Factors:Hypertension.  Sonographer:    Margreta Journey RDCS Referring Phys: [id number] Terrence Pizana J Ceasar Decandia  IMPRESSIONS   1. Left ventricular ejection fraction, by estimation, is 55 to 60%. The left ventricle has normal function. The left ventricle has no regional wall motion abnormalities. Left ventricular diastolic parameters are consistent with Grade II diastolic dysfunction (pseudonormalization). Elevated left atrial pressure. The average left ventricular global longitudinal strain is -12.9 %. The global longitudinal strain is abnormal. 2. Right ventricular systolic function is normal. The right ventricular size is normal. There is normal pulmonary artery systolic pressure. 3. Left atrial size was mildly dilated. 4. The mitral valve is degenerative. No evidence of mitral valve regurgitation. No evidence of mitral stenosis. 5. The aortic valve is tricuspid. Aortic valve regurgitation is mild. Aortic valve sclerosis is present, with no evidence of aortic valve stenosis. 6. Aortic Normal DTA. 7. The inferior vena cava is normal in size with greater than 50% respiratory variability, suggesting right atrial pressure of 3 mmHg.  FINDINGS Left Ventricle: Left ventricular ejection fraction, by estimation, is 55 to 60%. The left ventricle has normal function. The left ventricle has no regional wall motion abnormalities. The average left ventricular global longitudinal strain is -12.9 %. The global longitudinal strain is abnormal. The left ventricular internal cavity size was normal in size. There is no left ventricular hypertrophy. Left ventricular diastolic parameters are consistent with Grade II diastolic dysfunction (pseudonormalization). Elevated left atrial pressure.  Right  Ventricle: The right ventricular size is normal. No increase in right ventricular wall thickness. Right ventricular systolic function is normal. There is normal pulmonary artery systolic pressure. The tricuspid regurgitant velocity is 2.27 m/s, and with an assumed right atrial pressure of 3 mmHg, the estimated right ventricular systolic pressure is 23.6 mmHg.  Left Atrium: Left atrial size was mildly dilated.  Right Atrium: Right atrial size was normal in size.  Pericardium: There is no evidence of pericardial effusion.  Mitral Valve: The mitral valve is degenerative in appearance. Mild mitral annular calcification. No evidence of mitral valve regurgitation. No evidence of mitral valve stenosis.  Tricuspid Valve: The tricuspid valve is normal in structure. Tricuspid valve regurgitation is mild . No evidence of tricuspid stenosis.  Aortic Valve:  is a low risk study.   ECHOCARDIOGRAM  ECHOCARDIOGRAM COMPLETE 04/18/2023  Narrative ECHOCARDIOGRAM REPORT    Patient Name:   [name] Date of Exam: 04/18/2023 Medical Rec #:  [id number]      Height:       68.0 in Accession #:    [id number]     Weight:       147.0 lb Date of Birth:  1941/07/12      BSA:          1.793 m Patient Age:    82 years       BP:           90/60 mmHg Patient Gender: F              HR:           79 bpm. Exam Location:  [facility]  Procedure: 2D Echo, Color Doppler, Cardiac Doppler and Strain Analysis  Indications:    PAF (paroxysmal atrial fibrillation) (HCC) [I48.0 (ICD-10-CM)]; Chronic anticoagulation [Z79.01 (ICD-10-CM)]; NICM (nonischemic cardiomyopathy) (HCC) [I42.8 (ICD-10-CM)]; Mild CAD [I25.10 (ICD-10-CM)]; Essential hypertension [I10  (ICD-10-CM)]; Hypercholesterolemia [E78.00 (ICD-10-CM)]  History:        Patient has prior history of Echocardiogram examinations, most recent 02/18/2022. Arrythmias:Atrial Fibrillation, Signs/Symptoms:Dyspnea and Fatigue; Risk Factors:Hypertension.  Sonographer:    Margreta Journey RDCS Referring Phys: [id number] Terrence Pizana J Ceasar Decandia  IMPRESSIONS   1. Left ventricular ejection fraction, by estimation, is 55 to 60%. The left ventricle has normal function. The left ventricle has no regional wall motion abnormalities. Left ventricular diastolic parameters are consistent with Grade II diastolic dysfunction (pseudonormalization). Elevated left atrial pressure. The average left ventricular global longitudinal strain is -12.9 %. The global longitudinal strain is abnormal. 2. Right ventricular systolic function is normal. The right ventricular size is normal. There is normal pulmonary artery systolic pressure. 3. Left atrial size was mildly dilated. 4. The mitral valve is degenerative. No evidence of mitral valve regurgitation. No evidence of mitral stenosis. 5. The aortic valve is tricuspid. Aortic valve regurgitation is mild. Aortic valve sclerosis is present, with no evidence of aortic valve stenosis. 6. Aortic Normal DTA. 7. The inferior vena cava is normal in size with greater than 50% respiratory variability, suggesting right atrial pressure of 3 mmHg.  FINDINGS Left Ventricle: Left ventricular ejection fraction, by estimation, is 55 to 60%. The left ventricle has normal function. The left ventricle has no regional wall motion abnormalities. The average left ventricular global longitudinal strain is -12.9 %. The global longitudinal strain is abnormal. The left ventricular internal cavity size was normal in size. There is no left ventricular hypertrophy. Left ventricular diastolic parameters are consistent with Grade II diastolic dysfunction (pseudonormalization). Elevated left atrial pressure.  Right  Ventricle: The right ventricular size is normal. No increase in right ventricular wall thickness. Right ventricular systolic function is normal. There is normal pulmonary artery systolic pressure. The tricuspid regurgitant velocity is 2.27 m/s, and with an assumed right atrial pressure of 3 mmHg, the estimated right ventricular systolic pressure is 23.6 mmHg.  Left Atrium: Left atrial size was mildly dilated.  Right Atrium: Right atrial size was normal in size.  Pericardium: There is no evidence of pericardial effusion.  Mitral Valve: The mitral valve is degenerative in appearance. Mild mitral annular calcification. No evidence of mitral valve regurgitation. No evidence of mitral valve stenosis.  Tricuspid Valve: The tricuspid valve is normal in structure. Tricuspid valve regurgitation is mild . No evidence of tricuspid stenosis.  Aortic Valve:

## 2023-07-19 LAB — LIPID PANEL
Chol/HDL Ratio: 4.5 ratio — ABNORMAL HIGH (ref 0.0–4.4)
Cholesterol, Total: 262 mg/dL — ABNORMAL HIGH (ref 100–199)
HDL: 58 mg/dL (ref >39)
LDL Chol Calc (NIH): 150 mg/dL — ABNORMAL HIGH (ref 0–99)
Triglycerides: 296 mg/dL — ABNORMAL HIGH (ref 0–149)
VLDL Cholesterol Cal: 54 mg/dL — ABNORMAL HIGH (ref 5–40)

## 2023-07-19 LAB — COMPREHENSIVE METABOLIC PANEL
ALT: 16 IU/L (ref 0–32)
AST: 17 IU/L (ref 0–40)
Albumin: 4.3 g/dL (ref 3.7–4.7)
Alkaline Phosphatase: 88 IU/L (ref 44–121)
BUN/Creatinine Ratio: 18 (ref 12–28)
BUN: 20 mg/dL (ref 8–27)
Bilirubin Total: 0.4 mg/dL (ref 0.0–1.2)
CO2: 24 mmol/L (ref 20–29)
Calcium: 10 mg/dL (ref 8.7–10.3)
Chloride: 100 mmol/L (ref 96–106)
Creatinine, Ser: 1.11 mg/dL — ABNORMAL HIGH (ref 0.57–1.00)
Globulin, Total: 1.9 g/dL (ref 1.5–4.5)
Glucose: 76 mg/dL (ref 70–99)
Potassium: 4.6 mmol/L (ref 3.5–5.2)
Sodium: 139 mmol/L (ref 134–144)
Total Protein: 6.2 g/dL (ref 6.0–8.5)
eGFR: 49 mL/min/{1.73_m2} — ABNORMAL LOW (ref >59)

## 2023-07-19 LAB — CBC
Hematocrit: 41.9 % (ref 34.0–46.6)
Hemoglobin: 13.8 g/dL (ref 11.1–15.9)
MCH: 29.9 pg (ref 26.6–33.0)
MCHC: 32.9 g/dL (ref 31.5–35.7)
MCV: 91 fL (ref 79–97)
Platelets: 224 10*3/uL (ref 150–450)
RBC: 4.61 x10E6/uL (ref 3.77–5.28)
RDW: 15.3 % (ref 11.7–15.4)
WBC: 6.3 10*3/uL (ref 3.4–10.8)

## 2023-07-19 LAB — TSH+T4F+T3FREE
Free T4: 1.29 ng/dL (ref 0.82–1.77)
T3, Free: 2.5 pg/mL (ref 2.0–4.4)
TSH: 0.973 u[IU]/mL (ref 0.450–4.500)

## 2023-07-21 ENCOUNTER — Encounter: Payer: Self-pay | Admitting: Internal Medicine

## 2023-07-23 ENCOUNTER — Other Ambulatory Visit: Payer: Self-pay

## 2023-07-23 MED ORDER — PRAVASTATIN SODIUM 20 MG PO TABS: 20.0000 mg | ORAL_TABLET | Freq: Every evening | ORAL | 3 refills | Status: DC

## 2023-07-24 ENCOUNTER — Other Ambulatory Visit: Payer: Self-pay

## 2023-07-24 ENCOUNTER — Ambulatory Visit: Payer: Medicare HMO

## 2023-07-24 VITALS — BP 128/80 | HR 50 | Ht 68.0 in | Wt 136.0 lb

## 2023-07-24 DIAGNOSIS — I484 Atypical atrial flutter: Secondary | ICD-10-CM | POA: Diagnosis not present

## 2023-07-24 DIAGNOSIS — I480 Paroxysmal atrial fibrillation: Secondary | ICD-10-CM

## 2023-07-24 MED ORDER — LABETALOL HCL 100 MG PO TABS: 50.0000 mg | ORAL_TABLET | Freq: Two times a day (BID) | ORAL | 3 refills | Status: DC

## 2023-07-24 NOTE — Progress Notes (Signed)
Nurse Visit   Date of Encounter: 07/24/2023 ID: Henryetta, Guastella July 05, 1941, MRN [id number]  PCP:  Street, Stephanie Coup, MD   [facility] Providers Cardiologist:  Norman Herrlich, MD      Visit Details   VS:  BP 128/80 (BP Location: Right Arm, Patient Position: Sitting, Cuff Size: Normal)   Pulse (!) 50   Ht 5\' 8"  (1.727 m)   Wt 136 lb (61.7 kg)   SpO2 98%   BMI 20.68 kg/m  , BMI Body mass index is 20.68 kg/m.  Wt Readings from Last 3 Encounters:  07/24/23 136 lb (61.7 kg)  07/18/23 137 lb (62.1 kg)  07/11/23 136 lb (61.7 kg)     Reason for visit: Perform EKG after starting Amiodarone Performed today: Vitals, EKG, Provider consulted and Education Changes (medications, testing, etc.) : Labetolol 50 mg two times daily and hold the dose of Labetolol if heart rate is less than 60. Nurse visit in 2 weeks for EKG. Length of Visit: 25 minutes    Medications Adjustments/Labs and Tests Ordered: Orders Placed This Encounter  Procedures   EKG 12-Lead   Meds ordered this encounter  Medications   labetalol (NORMODYNE) 100 MG tablet    Sig: Take 0.5 tablets (50 mg total) by mouth 2 (two) times daily. If heart rate is less than 60 hold that dose of labetolol    Dispense:  90 tablet    Refill:  3     Signed, Samson Frederic, RN  07/24/2023 4:03 PM

## 2023-07-25 ENCOUNTER — Telehealth: Payer: Self-pay

## 2023-07-25 ENCOUNTER — Other Ambulatory Visit: Payer: Self-pay

## 2023-07-25 NOTE — Telephone Encounter (Signed)
[name] the patient's daughter called earlier and was asking if she needed to be giving the patient Metoprolol or the Labetolol.  Spoke with Dr. Dulce Sellar and he recommended that the patient stop her Metoprolol be cause it is not on our medication list and take the Labetolol as prescribed. Called [name] and informed her of Dr. Lubertha Basque recommendation to take the Labetolol medication and to stop the Metoprolol. [name] verbalized understanding and had no further questions at this time.

## 2023-08-01 ENCOUNTER — Telehealth: Payer: Self-pay | Admitting: Cardiology

## 2023-08-01 ENCOUNTER — Telehealth: Payer: Self-pay

## 2023-08-01 DIAGNOSIS — R55 Syncope and collapse: Secondary | ICD-10-CM

## 2023-08-01 DIAGNOSIS — I4891 Unspecified atrial fibrillation: Secondary | ICD-10-CM

## 2023-08-01 NOTE — Telephone Encounter (Signed)
[name] from [facility] called to report 2 events on posted Zio report.  1st event Sept 18, 2024 at 2:34 PM- Slow A fib, HR 39 lasting 60 seconds. Pg 11 St 4 2nd event Sept 21, 2024, 11:16 AM- Pause lasting 6.4 seconds Pg 12 St 6

## 2023-08-01 NOTE — Telephone Encounter (Signed)
Spoke with daughter regarding Dr. Hulen Shouts note. Advised to stop Amiodarone, Labetalol and Diltiazem and appt referral made fro first [facility] with Electrophysiology for possible pacemaker

## 2023-08-01 NOTE — Telephone Encounter (Signed)
Roselyn Meier from [place] is calling with abnormal results. Called transferred to triage.

## 2023-08-04 ENCOUNTER — Other Ambulatory Visit: Payer: Self-pay

## 2023-08-05 ENCOUNTER — Encounter: Payer: Self-pay | Admitting: Cardiology

## 2023-08-05 MED ORDER — RIVAROXABAN 20 MG PO TABS: 20.0000 mg | ORAL_TABLET | Freq: Every day | ORAL | 3 refills | Status: DC

## 2023-08-07 ENCOUNTER — Other Ambulatory Visit: Payer: Self-pay

## 2023-08-07 ENCOUNTER — Ambulatory Visit: Payer: Medicare HMO

## 2023-08-07 MED ORDER — RIVAROXABAN 20 MG PO TABS: 20.0000 mg | ORAL_TABLET | Freq: Every day | ORAL | Status: DC

## 2023-08-11 ENCOUNTER — Encounter: Payer: Self-pay | Admitting: Cardiology

## 2023-08-11 ENCOUNTER — Ambulatory Visit: Payer: Medicare HMO | Attending: Cardiology | Admitting: Cardiology

## 2023-08-11 VITALS — BP 116/68 | HR 71 | Ht 68.0 in | Wt 143.0 lb

## 2023-08-11 DIAGNOSIS — I4819 Other persistent atrial fibrillation: Secondary | ICD-10-CM

## 2023-08-11 DIAGNOSIS — D6869 Other thrombophilia: Secondary | ICD-10-CM | POA: Diagnosis not present

## 2023-08-11 DIAGNOSIS — I5022 Chronic systolic (congestive) heart failure: Secondary | ICD-10-CM | POA: Diagnosis not present

## 2023-08-11 NOTE — Progress Notes (Signed)
Electrophysiology Office Note:   Date:  08/11/2023  ID:  [name], DOB 06/05/1941, MRN [id number]  Primary Cardiologist: Norman Herrlich, MD Electrophysiologist: None      History of Present Illness:   [name] is a 82 y.o. female with h/o fibrillation, nonobstructive coronary artery disease, nonischemic cardiomyopathy with recovered ejection fraction seen today for  for Electrophysiology evaluation of atrial fibrillation with pauses at the request of Norman Herrlich.    She was noted to have palpitations.  She wore a cardiac monitor that showed a 100% atrial fibrillation/flutter burden.  She was having intermittent pauses of up to 6 seconds on the monitor during atrial fibrillation.  She has been started on amiodarone and labetalol.  Since she was found to have pauses on her cardiac monitor, both labetalol and amiodarone have been stopped.  She has had more energy, less fatigue, less shortness of breath.  Her daughter feels that she has been doing much better since stopping these medications and is hesitant to restart anything.  Review of systems complete and found to be negative unless listed in HPI.   EP Information / Studies Reviewed:    EKG is not ordered today. EKG from 07/24/23 reviewed which showed atrial flutter        Risk Assessment/Calculations:    CHA2DS2-VASc Score = 6   This indicates a 9.7% annual risk of stroke. The patient's score is based upon: CHF History: 1 HTN History: 1 Diabetes History: 0 Stroke History: 0 Vascular Disease History: 1 Age Score: 2 Gender Score: 1             Physical Exam:   VS:  BP 116/68   Pulse 71   Ht 5\' 8"  (1.727 m)   Wt 143 lb (64.9 kg)   SpO2 98%   BMI 21.74 kg/m    Wt Readings from Last 3 Encounters:  08/11/23 143 lb (64.9 kg)  07/24/23 136 lb (61.7 kg)  07/18/23 137 lb (62.1 kg)     GEN: Well nourished, well developed in no acute distress NECK: No JVD; No carotid bruits CARDIAC: Irregularly irregular rate  and rhythm, no murmurs, rubs, gallops RESPIRATORY:  Clear to auscultation without rales, wheezing or rhonchi  ABDOMEN: Soft, non-tender, non-distended EXTREMITIES:  No edema; No deformity   ASSESSMENT AND PLAN:    1.  Persistent atrial fibrillation/atrial flutter: Currently well rate controlled.  Her diltiazem, labetalol, amiodarone were held as she was found to have pauses.  She is felt much better off of these medications.  At this point, she would prefer a rate control strategy.  Leo Weyandt hold off on any further medications at this time.  If she does wish to get back into normal rhythm, would start low-dose amiodarone and cardioversion after a month of 200 mg a day.  Aside from that, both the patient and the daughter would prefer to avoid pacing if possible.  This was discussed with primary cardiology.  2.  Secondary hypercoagulable state: Currently on Eliquis for atrial fibrillation  3.  Chronic systolic heart failure: Due to nonischemic cardiomyopathy.  Ejection fraction is normalized.  Follow up with Dr. Elberta Fortis  PRN   Signed, Twain Stenseth Jorja Loa, MD

## 2023-08-14 ENCOUNTER — Other Ambulatory Visit: Payer: Self-pay

## 2023-08-14 ENCOUNTER — Telehealth: Payer: Self-pay | Admitting: Cardiology

## 2023-08-14 MED ORDER — RIVAROXABAN 15 MG PO TABS: 15.0000 mg | ORAL_TABLET | Freq: Every day | ORAL | 3 refills | Status: DC

## 2023-08-14 NOTE — Telephone Encounter (Signed)
 *  STAT* If patient is at the pharmacy, call can be transferred to refill team.   1. Which medications need to be refilled? (please list name of each medication and dose if known)   rivaroxaban (XARELTO) 20 MG TABS tablet     2. Would you like to learn more about the convenience, safety, & potential cost savings by using the [facility]?    3. Are you open to using the [facility] (Type [facility].    4. Which pharmacy/location (including street and city if local pharmacy) is medication to be sent to? [facility] [address] [place] - [address]    5. Do they need a 30 day or 90 day supply? 90 days

## 2023-08-14 NOTE — Telephone Encounter (Signed)
Prescription refill request for Xarelto received.  Indication: afib  Last office visit: [place], 08/11/2023 Weight: 64.9 kg  Age: 82 yo  Scr: 1.11, 07/18/2023 CrCl: 39 ml/min  Per dosing criteria pt qualifies for a dose change.

## 2023-08-14 NOTE — Telephone Encounter (Signed)
Agree with dose reduction to Xarelto 15mg  daily

## 2023-08-15 NOTE — Telephone Encounter (Signed)
Called and spoke to pt's daughter, Zella Ball, who stated that somebody called her from Dr. Hulen Shouts office yesterday and changed pt's Xarelto dose to Xarelto 15mg  daily instead of Xarelto 20mg  daily.   Will d/c Xarelto 20mg  from pt's med list.

## 2023-08-28 NOTE — Progress Notes (Unsigned)
Cardiology Office Note:    Date:  08/29/2023   ID:  [name], [name] 28-Sep-1941, MRN [id number]  PCP:  Street, Stephanie Coup, MD  Cardiologist:  Norman Herrlich, MD    Referring MD: [address] Stephanie Coup, *    ASSESSMENT:    1. Persistent atrial fibrillation (HCC)   2. Secondary hypercoagulable state (HCC)   3. Chronic systolic (congestive) heart failure (HCC)    PLAN:    In order of problems listed above:  Unfortunate with withdrawal of antiarrhythmic drug rate suppressing medication she is in rapid atrial flutter I am quite reluctant to give her rate suppressant medications without a backup pacemaker Ideally I think she would benefit from AV nodal ablation and obviate the need for rate slowing medications She has had a left Hickman catheter and decades ago I will communicate with Dr. Elberta Fortis   Next appointment: 3 months   Medication Adjustments/Labs and Tests Ordered: Current medicines are reviewed at length with the patient today.  Concerns regarding medicines are outlined above.  No orders of the defined types were placed in this encounter.  No orders of the defined types were placed in this encounter.    History of Present Illness:    [name] is a 82 y.o. female with a hx of atrial fibrillation nonobstructive CAD cardiomyopathy recovered ejection fraction and bradycardia with pauses in the setting of amiodarone and labetalol.  She was last seen by EP 08/11/2023.  As opposed to pacemaker the drugs were withdrawn decision was made to lower remain in atrial fibrillation with rate control anticoagulation and the patient feels markedly improved with a better quality of life.  Other problems include mild CAD.  Compliance with diet, lifestyle and medications: Yes  1 day with a weight gain of 3 pounds her daughter gave her an extra tablet furosemide Her than that she feels much better although she is weak and has trouble with physical activity unfortunately she is in  atypical atrial flutter with a ventricular rate of 138 bpm All of [place] are very reluctant to give her rate slowing medications with her previous bradycardia and long pauses I think ideally to solve her problem with rapid atrial arrhythmia and bradycardia with suppressant therapy she is best served with a pacemaker and I would even asked to consider AV nodal ablation to avoid the need for suppressant treatment She did have a Hickman catheter years ago in the left subclavian vein I told her daughter that I think that access would either be from the right side or even potentially consideration of a leadless pacemaker. She is not particularly symptomatic with the right heart rate heart failure is compensated and I am not going to alter current medications at this time She will continue her current anticoagulant Past Medical History:  Diagnosis Date   AML (acute myelogenous leukemia) (HCC)    Anxiety    Atrial fibrillation (HCC)    Barrett's esophagus    Basal cell carcinoma of vulva (HCC)    Depression    Endometrial polyp    "polyps" per intake sheet   GERD (gastroesophageal reflux disease)    Hemorrhoids    Hypertension    Spinal stenosis    Weakness     Current Medications: Current Meds  Medication Sig   Cholecalciferol (VITAMIN D) 125 MCG (5000 UT) CAPS Take 5,000 Units by mouth daily.   diazepam (VALIUM) 10 MG tablet Take 10 mg by mouth 2 (two) times daily. Takes an additional tablet if  needed in the afternoon   furosemide (LASIX) 20 MG tablet Take 2 tablets (40 mg total) by mouth every other day.   pantoprazole (PROTONIX) 40 MG tablet Take 1 tablet (40 mg total) by mouth daily.   Rivaroxaban (XARELTO) 15 MG TABS tablet Take 1 tablet (15 mg total) by mouth daily with supper.   venlafaxine XR (EFFEXOR XR) 37.5 MG 24 hr capsule Take 1 capsule (37.5 mg total) by mouth daily with breakfast. Please follow-up with PCP for further refills.   venlafaxine XR (EFFEXOR-XR) 150 MG 24 hr capsule  Take 150 mg by mouth daily.   vitamin B-12 (CYANOCOBALAMIN) 1000 MCG tablet Take 1,000 mcg by mouth daily.   Zinc 50 MG TABS Take 50 mg by mouth daily.   [DISCONTINUED] pravastatin (PRAVACHOL) 20 MG tablet Take 1 tablet (20 mg total) by mouth every evening.      EKGs/Labs/Other Studies Reviewed:    The following studies were reviewed today:  Cardiac Studies & Procedures   CARDIAC CATHETERIZATION  CARDIAC CATHETERIZATION 10/24/2021  Narrative   Mid LAD lesion is 40% stenosed.  1.  Mild diffuse nonobstructive coronary plaquing. 2.  Essentially normal intracardiac filling pressures with wedge pressure of 13 mmHg, PA pressure 25/9 with a mean of 16 mmHg, and preserved cardiac output of 7.4 L/min  Recommend: Patient appears to have nonischemic cardiomyopathy, likely tachycardia-mediated. Patient remains in atrial fibrillation with RVR.  I do not think her blood pressure will tolerate aggressive beta-blocker titration.  Calcium channel blockers are contraindicated in the setting of her severe cardiomyopathy.  Would start IV amiodarone and consider TEE/cardioversion per primary team.  We will start on IV heparin post procedure with plans to transition to oral anticoagulation per primary team.  Findings Coronary Findings Diagnostic  Dominance: Right  Left Main The vessel exhibits minimal luminal irregularities.  Left Anterior Descending There is mild diffuse disease throughout the vessel. Mild diffuse plaquing in the LAD, focal 40 to 50% lesion in the mid LAD noted.  There is no obstructive disease throughout the LAD or its diagonal branches. Mid LAD lesion is 40% stenosed.  Left Circumflex The vessel exhibits minimal luminal irregularities.  Right Coronary Artery There is mild diffuse disease throughout the vessel. Large, dominant RCA.  Diffuse irregularity throughout.  The PDA and PLA branches are patent with no significant stenoses.  Intervention  No interventions have been  documented.   STRESS TESTS  MYOCARDIAL PERFUSION IMAGING 02/08/2016  Interpretation Summary  Nuclear stress EF: 62%.  There was no ST segment deviation noted during stress.  The study is normal. no ecvidence of ischemia.  This is a low risk study.   ECHOCARDIOGRAM  ECHOCARDIOGRAM COMPLETE 04/18/2023  Narrative ECHOCARDIOGRAM REPORT    Patient Name:   IFEOLUWA BARTZ Date of Exam: 04/18/2023 Medical Rec #:  [id number]      Height:       68.0 in Accession #:    [id number]     Weight:       147.0 lb Date of Birth:  [birth year]      BSA:          1.793 m Patient Age:    82 years       BP:           90/60 mmHg Patient Gender: F              HR:           79 bpm. Exam Location:  [place]  Procedure: 2D  Echo, Color Doppler, Cardiac Doppler and Strain Analysis  Indications:    PAF (paroxysmal atrial fibrillation) (HCC) [I48.0 (ICD-10-CM)]; Chronic anticoagulation [Z79.01 (ICD-10-CM)]; NICM (nonischemic cardiomyopathy) (HCC) [I42.8 (ICD-10-CM)]; Mild CAD [I25.10 (ICD-10-CM)]; Essential hypertension [I10 (ICD-10-CM)]; Hypercholesterolemia [E78.00 (ICD-10-CM)]  History:        Patient has prior history of Echocardiogram examinations, most recent 02/18/2022. Arrythmias:Atrial Fibrillation, Signs/Symptoms:Dyspnea and Fatigue; Risk Factors:Hypertension.  Sonographer:    Margreta Journey RDCS Referring Phys: [id number] Nasiyah Laverdiere J Kardell Virgil  IMPRESSIONS   1. Left ventricular ejection fraction, by estimation, is 55 to 60%. The left ventricle has normal function. The left ventricle has no regional wall motion abnormalities. Left ventricular diastolic parameters are consistent with Grade II diastolic dysfunction (pseudonormalization). Elevated left atrial pressure. The average left ventricular global longitudinal strain is -12.9 %. The global longitudinal strain is abnormal. 2. Right ventricular systolic function is normal. The right ventricular size is normal. There is normal pulmonary artery  systolic pressure. 3. Left atrial size was mildly dilated. 4. The mitral valve is degenerative. No evidence of mitral valve regurgitation. No evidence of mitral stenosis. 5. The aortic valve is tricuspid. Aortic valve regurgitation is mild. Aortic valve sclerosis is present, with no evidence of aortic valve stenosis. 6. Aortic Normal DTA. 7. The inferior vena cava is normal in size with greater than 50% respiratory variability, suggesting right atrial pressure of 3 mmHg.  FINDINGS Left Ventricle: Left ventricular ejection fraction, by estimation, is 55 to 60%. The left ventricle has normal function. The left ventricle has no regional wall motion abnormalities. The average left ventricular global longitudinal strain is -12.9 %. The global longitudinal strain is abnormal. The left ventricular internal cavity size was normal in size. There is no left ventricular hypertrophy. Left ventricular diastolic parameters are consistent with Grade II diastolic dysfunction (pseudonormalization). Elevated left atrial pressure.  Right Ventricle: The right ventricular size is normal. No increase in right ventricular wall thickness. Right ventricular systolic function is normal. There is normal pulmonary artery systolic pressure. The tricuspid regurgitant velocity is 2.27 m/s, and with an assumed right atrial pressure of 3 mmHg, the estimated right ventricular systolic pressure is 23.6 mmHg.  Left Atrium: Left atrial size was mildly dilated.  Right Atrium: Right atrial size was normal in size.  Pericardium: There is no evidence of pericardial effusion.  Mitral Valve: The mitral valve is degenerative in appearance. Mild mitral annular calcification. No evidence of mitral valve regurgitation. No evidence of mitral valve stenosis.  Tricuspid Valve: The tricuspid valve is normal in structure. Tricuspid valve regurgitation is mild . No evidence of tricuspid stenosis.  Aortic Valve: The aortic valve is tricuspid.  Aortic valve regurgitation is mild. Aortic regurgitation PHT measures 511 msec. Aortic valve sclerosis is present, with no evidence of aortic valve stenosis.  Pulmonic Valve: The pulmonic valve was normal in structure. Pulmonic valve regurgitation is not visualized. No evidence of pulmonic stenosis.  Aorta: The aortic arch was not well visualized, the aortic root and ascending aorta are structurally normal, with no evidence of dilitation and Normal DTA.  Venous: The pulmonary veins were not well visualized. The inferior vena cava is normal in size with greater than 50% respiratory variability, suggesting right atrial pressure of 3 mmHg.  IAS/Shunts: No atrial level shunt detected by color flow Doppler.   LEFT VENTRICLE PLAX 2D LVIDd:         4.20 cm   Diastology LVIDs:         2.70 cm   LV e' medial:  5.33 cm/s LV PW:         0.80 cm   LV E/e' medial:  13.6 LV IVS:        0.80 cm   LV e' lateral:   8.59 cm/s LVOT diam:     2.00 cm   LV E/e' lateral: 8.4 LV SV:         52 LV SV Index:   29        2D Longitudinal Strain LVOT Area:     3.14 cm  2D Strain GLS Avg:     -12.9 %   RIGHT VENTRICLE RV Basal diam:  3.00 cm RV Mid diam:    2.35 cm RV S prime:     13.60 cm/s TAPSE (M-mode): 2.4 cm  LEFT ATRIUM             Index        RIGHT ATRIUM           Index LA diam:        4.50 cm 2.51 cm/m   RA Area:     17.00 cm LA Vol (A2C):   47.7 ml 26.60 ml/m  RA Volume:   43.30 ml  24.15 ml/m LA Vol (A4C):   93.0 ml 51.87 ml/m LA Biplane Vol: 68.0 ml 37.92 ml/m AORTIC VALVE LVOT Vmax:   80.00 cm/s LVOT Vmean:  52.433 cm/s LVOT VTI:    0.166 m AI PHT:      511 msec  AORTA Ao Root diam: 3.00 cm Ao Asc diam:  3.30 cm Ao Desc diam: 1.50 cm  MITRAL VALVE               TRICUSPID VALVE MV Area (PHT): 3.50 cm    TR Peak grad:   20.6 mmHg MV Decel Time: 217 msec    TR Vmax:        227.00 cm/s MV E velocity: 72.30 cm/s MV A velocity: 54.50 cm/s  SHUNTS MV E/A ratio:  1.33         Systemic VTI:  0.17 m Systemic Diam: 2.00 cm  Norman Herrlich MD Electronically signed by Norman Herrlich MD Signature Date/Time: 04/20/2023/7:30:39 PM    Final    MONITORS  LONG TERM MONITOR (3-14 DAYS) 08/01/2023  Narrative Patch Wear Time:  13 days and 23 hours (2024-09-13T17:22:43-0400 to 2024-09-27T17:22:35-0400)  Atrial Fibrillation/Flutter occurred continuously (100% burden), ranging from 31-149 bpm (avg of 77 bpm). 23 Pauses occurred, the longest lasting 6.4 secs (9 bpm). Isolated VEs were rare (<1.0%), and no VE Couplets or VE Triplets were present. MD notification criteria for Slow Atrial Fibrillation/Atrial Flutter and Pauses met - report posted prior to notification per account request (TM).  Impression: Atrial fibrillation with significantly slower rates at times.              EKG Interpretation Date/Time:  Friday August 29 2023 16:33:28 EDT Ventricular Rate:  123 PR Interval:    QRS Duration:  92 QT Interval:  352 QTC Calculation: 503 R Axis:   26  Text Interpretation: Atypical atrial flutter with rapid rate often 2-1 conduction QS lead V2 nonspecific ST changes Confirmed by Norman Herrlich ([id number]) on 08/29/2023 5:03:51 PM   Recent Labs: 07/18/2023: ALT 16; BUN 20; Creatinine, Ser 1.11; Hemoglobin 13.8; Platelets 224; Potassium 4.6; Sodium 139; TSH 0.973  Recent Lipid Panel    Component Value Date/Time   CHOL 262 (H) 07/18/2023 1628   TRIG 296 (H) 07/18/2023 1628   HDL 58 07/18/2023 1628  CHOLHDL 4.5 (H) 07/18/2023 1628   LDLCALC 150 (H) 07/18/2023 1628    Physical Exam:    VS:  BP 118/78   Ht 5\' 8"  (1.727 m)   Wt 151 lb (68.5 kg)   SpO2 95%   BMI 22.96 kg/m     Wt Readings from Last 3 Encounters:  08/29/23 151 lb (68.5 kg)  08/11/23 143 lb (64.9 kg)  07/24/23 136 lb (61.7 kg)     GEN:  Well nourished, well developed in no acute distress HEENT: Normal NECK: No JVD; No carotid bruits LYMPHATICS: No lymphadenopathy CARDIAC: Rapid regular heart  rhythm  RESPIRATORY:  Clear to auscultation without rales, wheezing or rhonchi  ABDOMEN: Soft, non-tender, non-distended MUSCULOSKELETAL:  No edema; No deformity  SKIN: Warm and dry NEUROLOGIC:  Alert and oriented x 3 PSYCHIATRIC:  Normal affect    Signed, Norman Herrlich, MD  08/29/2023 4:29 PM    [facility]

## 2023-08-29 ENCOUNTER — Encounter: Payer: Self-pay | Admitting: Cardiology

## 2023-08-29 ENCOUNTER — Ambulatory Visit: Payer: Medicare HMO | Attending: Cardiology | Admitting: Cardiology

## 2023-08-29 VITALS — BP 118/78 | Ht 68.0 in | Wt 151.0 lb

## 2023-08-29 DIAGNOSIS — D6869 Other thrombophilia: Secondary | ICD-10-CM

## 2023-08-29 DIAGNOSIS — I4819 Other persistent atrial fibrillation: Secondary | ICD-10-CM

## 2023-08-29 DIAGNOSIS — I5022 Chronic systolic (congestive) heart failure: Secondary | ICD-10-CM

## 2023-08-29 MED ORDER — FUROSEMIDE 20 MG PO TABS: 20.0000 mg | ORAL_TABLET | Freq: Every day | ORAL | 3 refills | Status: DC

## 2023-08-29 NOTE — Patient Instructions (Signed)
Medication Instructions:  Your physician has recommended you make the following change in your medication:   START: Lasix 20 mg take 2 tablets (40 mg ) daily. May also take an extra lasix daily for weight greater than 150 lbs.  *If you need a refill on your cardiac medications before your next appointment, please call your pharmacy*   Lab Work: None If you have labs (blood work) drawn today and your tests are completely normal, you will receive your results only by: MyChart Message (if you have MyChart) OR A paper copy in the mail If you have any lab test that is abnormal or we need to change your treatment, we will call you to review the results.   Testing/Procedures: None   Follow-Up: At [facility], you and your health needs are our priority.  As part of our continuing mission to provide you with exceptional heart care, we have created designated Provider Care Teams.  These Care Teams include your primary Cardiologist (physician) and Advanced Practice Providers (APPs -  Physician Assistants and Nurse Practitioners) who all work together to provide you with the care you need, when you need it.  We recommend signing up for the patient portal called "MyChart".  Sign up information is provided on this After Visit Summary.  MyChart is used to connect with patients for Virtual Visits (Telemedicine).  Patients are able to view lab/test results, encounter notes, upcoming appointments, etc.  Non-urgent messages can be sent to your provider as well.   To learn more about what you can do with MyChart, go to [email or web address].    Your next appointment:   3 month(s)  Provider:   Norman Herrlich, MD    Other Instructions None

## 2023-09-04 ENCOUNTER — Telehealth: Payer: Self-pay

## 2023-09-04 ENCOUNTER — Telehealth: Payer: Self-pay | Admitting: Cardiology

## 2023-09-04 NOTE — Telephone Encounter (Signed)
Calling about ready for the patient to have the pacemaker. Please advise

## 2023-09-04 NOTE — Telephone Encounter (Signed)
Received a call from Zella Ball, the patient's daughter, and she stated that Dr. Gershon Crane office had not contacted her to set-up her mothers appointment. I explained that they were already established with Dr. Elberta Fortis and that Dr. Dulce Sellar had sent Dr. Elberta Fortis a message regarding the need for the patient to have another appointment. [name] asked if she could have Dr. Elberta Fortis office phone number so she could call and set-up the appointment and it was provided to her. Zella Ball had no further questions at this time.

## 2023-09-04 NOTE — Telephone Encounter (Signed)
I called and spoke with Pt's Daughter Zella Ball) and she stated that Dr. Dulce Sellar suggested that the pt move fwd with having the AV Node Ablation and PPM Implant. She called the [place] office and they gave her an appointment to see Dr Elberta Fortis on 11/24/23 and was told that was the first available.   Dr. Elberta Fortis... Does the pt need to be seen again before scheduling the procedure? If so, can she be seen by an APP to get in quicker?  AV Node/PPM Implant

## 2023-09-08 ENCOUNTER — Encounter: Payer: Self-pay | Admitting: Cardiology

## 2023-09-08 ENCOUNTER — Ambulatory Visit: Payer: Medicare HMO | Attending: Cardiology | Admitting: Cardiology

## 2023-09-08 VITALS — BP 136/68 | HR 104 | Ht 68.0 in | Wt 151.2 lb

## 2023-09-08 DIAGNOSIS — I4819 Other persistent atrial fibrillation: Secondary | ICD-10-CM

## 2023-09-08 DIAGNOSIS — I495 Sick sinus syndrome: Secondary | ICD-10-CM | POA: Diagnosis not present

## 2023-09-08 DIAGNOSIS — D6869 Other thrombophilia: Secondary | ICD-10-CM

## 2023-09-08 NOTE — Patient Instructions (Signed)
Medication Instructions:  Your physician recommends that you continue on your current medications as directed. Please refer to the Current Medication list given to you today.     * If you need a refill on your cardiac medications before your next appointment, please call your pharmacy. *   Labwork: Your physician recommends that you return for pre procedure lab work on: _________  * Will notify you of abnormal results, otherwise continue current treatment plan.*   Testing/Procedures: Your physician has recommended that you have a pacemaker inserted. A pacemaker is a small device that is placed under the skin of your chest or abdomen to help control abnormal heart rhythms. This device uses electrical pulses to prompt the heart to beat at a normal rate. Pacemakers are used to treat heart rhythms that are too slow. Wire (leads) are attached to the pacemaker that goes into the chambers of you heart. This is done in the hospital and usually requires and overnight stay. You will be scheduled for 10/23/23, to arrive to [facility] [facility] at 11:30 am.  The office will call you to go over instructions for this procedure.   Follow-Up: You will be scheduled for a 2 week wound check and 3 month physician check.   Thank you for choosing [facility]!!   Dory Horn, RN [phone]   Other Instructions   Pacemaker Implantation, Adult Pacemaker implantation is a procedure to place a pacemaker inside your chest. A pacemaker is a small computer that sends electrical signals to the heart and helps your heart beat normally. A pacemaker also stores information about your heart rhythms. You may need pacemaker implantation if you: Have a slow heartbeat (bradycardia). Faint (syncope). Have shortness of breath (dyspnea) due to heart problems.  The pacemaker attaches to your heart through a wire, called a lead. Sometimes just one lead is needed. Other times, there will be two leads. There are two  types of pacemakers: Transvenous pacemaker. This type is placed under the skin or muscle of your chest. The lead goes through a vein in the chest area to reach the inside of the heart. Epicardial pacemaker. This type is placed under the skin or muscle of your chest or belly. The lead goes through your chest to the outside of the heart.  Tell a health care provider about: Any allergies you have. All medicines you are taking, including vitamins, herbs, eye drops, creams, and over-the-counter medicines. Any problems you or family members have had with anesthetic medicines. Any blood or bone disorders you have. Any surgeries you have had. Any medical conditions you have. Whether you are pregnant or may be pregnant. What are the risks? Generally, this is a safe procedure. However, problems may occur, including: Infection. Bleeding. Failure of the pacemaker or the lead. Collapse of a lung or bleeding into a lung. Blood clot inside a blood vessel with a lead. Damage to the heart. Infection inside the heart (endocarditis). Allergic reactions to medicines.  What happens before the procedure? Staying hydrated Follow instructions from your health care provider about hydration, which may include: Up to 2 hours before the procedure - you may continue to drink clear liquids, such as water, clear fruit juice, black coffee, and plain tea.  Eating and drinking restrictions Follow instructions from your health care provider about eating and drinking, which may include: 8 hours before the procedure - stop eating heavy meals or foods such as meat, fried foods, or fatty foods. 6 hours before the procedure - stop  eating light meals or foods, such as toast or cereal. 6 hours before the procedure - stop drinking milk or drinks that contain milk. 2 hours before the procedure - stop drinking clear liquids.  Medicines Ask your health care provider about: Changing or stopping your regular medicines. This is  especially important if you are taking diabetes medicines or blood thinners. Taking medicines such as aspirin and ibuprofen. These medicines can thin your blood. Do not take these medicines before your procedure if your health care provider instructs you not to. You may be given antibiotic medicine to help prevent infection. General instructions You will have a heart evaluation. This may include an electrocardiogram (ECG), chest X-ray, and heart imaging (echocardiogram,  or echo) tests. You will have blood tests. Do not use any products that contain nicotine or tobacco, such as cigarettes and e-cigarettes. If you need help quitting, ask your health care provider. Plan to have someone take you home from the hospital or clinic. If you will be going home right after the procedure, plan to have someone with you for 24 hours. Ask your health care provider how your surgical site will be marked or identified. What happens during the procedure? To reduce your risk of infection: Your health care team will wash or sanitize their hands. Your skin will be washed with soap. Hair may be removed from the surgical area. An IV tube will be inserted into one of your veins. You will be given one or more of the following: A medicine to help you relax (sedative). A medicine to numb the area (local anesthetic). A medicine to make you fall asleep (general anesthetic). If you are getting a transvenous pacemaker: An incision will be made in your upper chest. A pocket will be made for the pacemaker. It may be placed under the skin or between layers of muscle. The lead will be inserted into a blood vessel that returns to the heart. While X-rays are taken by an imaging machine (fluoroscopy), the lead will be advanced through the vein to the inside of your heart. The other end of the lead will be tunneled under the skin and attached to the pacemaker. If you are getting an epicardial pacemaker: An incision will be made  near your ribs or breastbone (sternum) for the lead. The lead will be attached to the outside of your heart. Another incision will be made in your chest or upper belly to create a pocket for the pacemaker. The free end of the lead will be tunneled under the skin and attached to the pacemaker. The transvenous or epicardial pacemaker will be tested. Imaging studies may be done to check the lead position. The incisions will be closed with stitches (sutures), adhesive strips, or skin glue. Bandages (dressing) will be placed over the incisions. The procedure may vary among health care providers and hospitals. What happens after the procedure? Your blood pressure, heart rate, breathing rate, and blood oxygen level will be monitored until the medicines you were given have worn off. You will be given antibiotics and pain medicine. ECG and chest x-rays will be done. You will wear a continuous type of ECG (Holter monitor) to check your heart rhythm. Your health care provider will program the pacemaker. Do not drive for 24 hours if you received a sedative. This information is not intended to replace advice given to you by your health care provider. Make sure you discuss any questions you have with your health care provider. Document Released: 10/04/2002 Document Revised:  05/03/2016 Document Reviewed: 03/27/2016 Elsevier Interactive Patient Education  2018 [employer].     Pacemaker Implantation, Adult, Care After This sheet gives you information about how to care for yourself after your procedure. Your health care provider may also give you more specific instructions. If you have problems or questions, contact your health care provider. What can I expect after the procedure? After the procedure, it is common to have: Mild pain. Slight bruising. Some swelling over the incision. A slight bump over the skin where the device was placed. Sometimes, it is possible to feel the device under the skin.  This is normal.  Follow these instructions at home: Medicines Take over-the-counter and prescription medicines only as told by your health care provider. If you were prescribed an antibiotic medicine, take it as told by your health care provider. Do not stop taking the antibiotic even if you start to feel better. Wound care Do not remove the bandage on your chest until directed to do so by your health care provider. After your bandage is removed, you may see pieces of tape called skin adhesive strips over the area where the cut was made (incision site). Let them fall off on their own. Check the incision site every day to make sure it is not infected, bleeding, or starting to pull apart. Do not use lotions or ointments near the incision site unless directed to do so. Keep the incision area clean and dry for 2-3 days after the procedure or as directed by your health care provider. It takes several weeks for the incision site to completely heal. Do not take baths, swim, or use a hot tub for 7-10 days or as otherwise directed by your health care provider. Activity Do not drive or use heavy machinery while taking prescription pain medicine. Do not drive for 24 hours if you were given a medicine to help you relax (sedative). Check with your health care provider before you start to drive or play sports. Avoid sudden jerking, pulling, or chopping movements that pull your upper arm far away from your body. Avoid these movements for at least 6 weeks or as long as told by your health care provider. Do not lift your upper arm above your shoulders for at least 6 weeks or as long as told by your health care provider. This means no tennis, golf, or swimming. You may go back to work when your health care provider says it is okay. Pacemaker care You may be shown how to transfer data from your pacemaker through the phone to your health care provider. Always let all health care providers know about your pacemaker  before you have any medical procedures or tests. Wear a medical ID bracelet or necklace stating that you have a pacemaker. Carry a pacemaker ID card with you at all times. Your pacemaker battery will last for 5-15 years. Routine checks by your health care provider will let the health care provider know when the battery is starting to run down. The pacemaker will need to be replaced when the battery starts to run down. Do not use amateur Proofreader. Other electrical devices are safe to use, including power tools, lawn mowers, and speakers. If you are unsure of whether something is safe to use, ask your health care provider. When using your cell phone, hold it to the ear opposite the pacemaker. Do not leave your cell phone in a pocket over the pacemaker. Avoid places or objects that have a  strong electric or Data processing manager, including: Scientist, physiological. When at the airport, let officials know that you have a pacemaker. Power plants. Large electrical generators. Radiofrequency transmission towers, such as cell phone and radio towers. General instructions Weigh yourself every day. If you suddenly gain weight, fluid may be building up in your body. Keep all follow-up visits as told by your health care provider. This is important. Contact a health care provider if: You gain weight suddenly. Your legs or feet swell. It feels like your heart is fluttering or skipping beats (heart palpitations). You have chills or a fever. You have more redness, swelling, or pain around your incisions. You have more fluid or blood coming from your incisions. Your incisions feel warm to the touch. You have pus or a bad smell coming from your incisions. Get help right away if: You have chest pain. You have trouble breathing or are short of breath. You become extremely tired. You are light-headed or you faint. This information is not intended to replace advice given to you by your  health care provider. Make sure you discuss any questions you have with your health care provider. Document Released: 05/03/2005 Document Revised: 07/26/2016 Document Reviewed: 07/26/2016 Elsevier Interactive Patient Education  2018 [employer].    Supplemental Discharge Instructions for  Pacemaker/Defibrillator Patients  ACTIVITY No heavy lifting or vigorous activity with your left/right arm for 6 to 8 weeks.  Do not raise your left/right arm above your head for one week.  Gradually raise your affected arm as drawn below.           __  NO DRIVING for     ; you may begin driving on     .  WOUND CARE Keep the wound area clean and dry.  Do not get this area wet for one week. No showers for one week; you may shower on     . The tape/steri-strips on your wound will fall off; do not pull them off.  No bandage is needed on the site.  DO  NOT apply any creams, oils, or ointments to the wound area. If you notice any drainage or discharge from the wound, any swelling or bruising at the site, or you develop a fever > 101? F after you are discharged home, call the office at once.  SPECIAL INSTRUCTIONS You are still able to use cellular telephones; use the ear opposite the side where you have your pacemaker/defibrillator.  Avoid carrying your cellular phone near your device. When traveling through airports, show security personnel your identification card to avoid being screened in the metal detectors.  Ask the security personnel to use the hand wand. Avoid arc welding equipment, MRI testing (magnetic resonance imaging), TENS units (transcutaneous nerve stimulators).  Call the office for questions about other devices. Avoid electrical appliances that are in poor condition or are not properly grounded. Microwave ovens are safe to be near or to operate.  ADDITIONAL INFORMATION FOR DEFIBRILLATOR PATIENTS SHOULD YOUR DEVICE GO OFF: If your device goes off ONCE and you feel fine afterward, notify the  device clinic nurses. If your device goes off ONCE and you do not feel well afterward, call 911. If your device goes off TWICE, call 911. If your device goes off THREE TIMES IN ONE DAY, call 911.  DO NOT DRIVE YOURSELF OR A FAMILY MEMBER WITH A DEFIBRILLATOR TO THE HOSPITAL--CALL 911.

## 2023-09-08 NOTE — Telephone Encounter (Signed)
Spoke to dtr. Pt scheduled to see Dr. Elberta Fortis today, in [place]. Dtr appreciates the call & appt.

## 2023-09-08 NOTE — Progress Notes (Signed)
  Electrophysiology Office Note:   Date:  09/08/2023  ID:  Tymia, Buche May 07, 1941, MRN [id number]  Primary Cardiologist: Norman Herrlich, MD Electrophysiologist: None      History of Present Illness:   LISABETH BIBB is a 82 y.o. female with h/o atrial fibrillation, nonobstructive coronary artery disease, nonischemic cardiomyopathy with recovered ejection fraction seen today for routine electrophysiology followup.   Since last being seen in our clinic the patient reports doing overall well.  She has no chest pain or shortness of breath.  She is unaware of rapid heart rates.  At home, heart rates varied between 70 and 110-120.  She feels that her heart rates are more rapid more often.  She does note some fatigue and shortness of breath..  she denies chest pain, palpitations, dyspnea, PND, orthopnea, nausea, vomiting, dizziness, syncope, edema, weight gain, or early satiety.   Review of systems complete and found to be negative unless listed in HPI.   EP Information / Studies Reviewed:    EKG is not ordered today. EKG from 08/29/23 reviewed which showed atrial flutter, rate 121        Risk Assessment/Calculations:    CHA2DS2-VASc Score = 6   This indicates a 9.7% annual risk of stroke. The patient's score is based upon: CHF History: 1 HTN History: 1 Diabetes History: 0 Stroke History: 0 Vascular Disease History: 1 Age Score: 2 Gender Score: 1             Physical Exam:   VS:  BP 136/68   Pulse (!) 104   Ht 5\' 8"  (1.727 m)   Wt 151 lb 3.2 oz (68.6 kg)   SpO2 96%   BMI 22.99 kg/m    Wt Readings from Last 3 Encounters:  09/08/23 151 lb 3.2 oz (68.6 kg)  08/29/23 151 lb (68.5 kg)  08/11/23 143 lb (64.9 kg)     GEN: Well nourished, well developed in no acute distress NECK: No JVD; No carotid bruits CARDIAC: Irregularly irregular rate and rhythm, no murmurs, rubs, gallops RESPIRATORY:  Clear to auscultation without rales, wheezing or rhonchi  ABDOMEN: Soft,  non-tender, non-distended EXTREMITIES:  No edema; No deformity   ASSESSMENT AND PLAN:    1.  Persistent atrial fibrillation/flutter: Unfortunately has episodes of rapid rates.  Unable to tolerate rate control due to significant bradycardia.  She would also potentially benefit from rhythm control.  She feels somewhat fatigued and short of breath and atrial fibrillation.  Plan for rhythm control post pacemaker implant.  2.  Tachybradycardia syndrome: Has had long pauses with rate controlling medications, heart rates consistently above 100 bpm.  She has a history of tachycardia mediated cardiomyopathy.  She would benefit from pacemaker implant.  Risk and benefits have been discussed.  Risk include bleeding, tamponade, infection, pneumothorax, lead dislodgment, MI, renal failure, death, stroke.  She understands these risks and is agreed to the procedure.  3.  Secondary hypercoagulable state: Currently on Xarelto for atrial fibrillation.  Charle Clear hold prior to pacemaker implant  4.  Chronic systolic heart failure: Ejection fraction has normalized.  Follow up with [facility]  1 month post pacemaker   Signed, Keyna Blizard Jorja Loa, MD

## 2023-09-15 ENCOUNTER — Institutional Professional Consult (permissible substitution): Payer: Medicare HMO | Admitting: Cardiology

## 2023-09-18 ENCOUNTER — Telehealth: Payer: Self-pay

## 2023-09-18 DIAGNOSIS — I495 Sick sinus syndrome: Secondary | ICD-10-CM

## 2023-09-18 NOTE — Telephone Encounter (Signed)
Spoke to pt's Daughter - Pt is scheduled for PPM Implant with Dr. Elberta Fortis on 12/26 at 12:30 PM.   She will have labs done at [facility] office.   Instruction letter sent via MyChart and copy mailed to pt.

## 2023-09-29 DIAGNOSIS — H40011 Open angle with borderline findings, low risk, right eye: Secondary | ICD-10-CM | POA: Diagnosis not present

## 2023-09-29 DIAGNOSIS — H401121 Primary open-angle glaucoma, left eye, mild stage: Secondary | ICD-10-CM | POA: Diagnosis not present

## 2023-10-10 DIAGNOSIS — I495 Sick sinus syndrome: Secondary | ICD-10-CM | POA: Diagnosis not present

## 2023-10-11 LAB — BASIC METABOLIC PANEL
BUN/Creatinine Ratio: 19 (ref 12–28)
BUN: 20 mg/dL (ref 8–27)
CO2: 21 mmol/L (ref 20–29)
Calcium: 9.4 mg/dL (ref 8.7–10.3)
Chloride: 104 mmol/L (ref 96–106)
Creatinine, Ser: 1.03 mg/dL — ABNORMAL HIGH (ref 0.57–1.00)
Glucose: 101 mg/dL — ABNORMAL HIGH (ref 70–99)
Potassium: 3.9 mmol/L (ref 3.5–5.2)
Sodium: 145 mmol/L — ABNORMAL HIGH (ref 134–144)
eGFR: 54 mL/min/{1.73_m2} — ABNORMAL LOW (ref >59)

## 2023-10-11 LAB — CBC
Hematocrit: 37.4 % (ref 34.0–46.6)
Hemoglobin: 12.5 g/dL (ref 11.1–15.9)
MCH: 30.4 pg (ref 26.6–33.0)
MCHC: 33.4 g/dL (ref 31.5–35.7)
MCV: 91 fL (ref 79–97)
Platelets: 175 10*3/uL (ref 150–450)
RBC: 4.11 x10E6/uL (ref 3.77–5.28)
RDW: 13.7 % (ref 11.7–15.4)
WBC: 6.5 10*3/uL (ref 3.4–10.8)

## 2023-10-16 DIAGNOSIS — I110 Hypertensive heart disease with heart failure: Secondary | ICD-10-CM | POA: Diagnosis not present

## 2023-10-16 DIAGNOSIS — J90 Pleural effusion, not elsewhere classified: Secondary | ICD-10-CM | POA: Diagnosis not present

## 2023-10-16 DIAGNOSIS — R000 Tachycardia, unspecified: Secondary | ICD-10-CM | POA: Diagnosis not present

## 2023-10-16 DIAGNOSIS — R4781 Slurred speech: Secondary | ICD-10-CM | POA: Diagnosis not present

## 2023-10-16 DIAGNOSIS — I4891 Unspecified atrial fibrillation: Secondary | ICD-10-CM | POA: Diagnosis not present

## 2023-10-16 DIAGNOSIS — R918 Other nonspecific abnormal finding of lung field: Secondary | ICD-10-CM | POA: Diagnosis not present

## 2023-10-16 DIAGNOSIS — I509 Heart failure, unspecified: Secondary | ICD-10-CM | POA: Diagnosis not present

## 2023-10-16 DIAGNOSIS — G9341 Metabolic encephalopathy: Secondary | ICD-10-CM | POA: Diagnosis not present

## 2023-10-16 DIAGNOSIS — J159 Unspecified bacterial pneumonia: Secondary | ICD-10-CM | POA: Diagnosis not present

## 2023-10-16 DIAGNOSIS — J189 Pneumonia, unspecified organism: Secondary | ICD-10-CM | POA: Diagnosis not present

## 2023-10-16 DIAGNOSIS — R531 Weakness: Secondary | ICD-10-CM | POA: Diagnosis not present

## 2023-10-16 DIAGNOSIS — I428 Other cardiomyopathies: Secondary | ICD-10-CM | POA: Diagnosis not present

## 2023-10-16 DIAGNOSIS — R6520 Severe sepsis without septic shock: Secondary | ICD-10-CM | POA: Diagnosis not present

## 2023-10-16 DIAGNOSIS — R509 Fever, unspecified: Secondary | ICD-10-CM | POA: Diagnosis not present

## 2023-10-16 DIAGNOSIS — R55 Syncope and collapse: Secondary | ICD-10-CM | POA: Diagnosis not present

## 2023-10-16 DIAGNOSIS — Z743 Need for continuous supervision: Secondary | ICD-10-CM | POA: Diagnosis not present

## 2023-10-16 DIAGNOSIS — A419 Sepsis, unspecified organism: Secondary | ICD-10-CM | POA: Diagnosis not present

## 2023-10-16 DIAGNOSIS — W19XXXA Unspecified fall, initial encounter: Secondary | ICD-10-CM | POA: Diagnosis not present

## 2023-10-16 DIAGNOSIS — R0902 Hypoxemia: Secondary | ICD-10-CM | POA: Diagnosis not present

## 2023-10-16 DIAGNOSIS — J9601 Acute respiratory failure with hypoxia: Secondary | ICD-10-CM | POA: Diagnosis not present

## 2023-10-16 DIAGNOSIS — R339 Retention of urine, unspecified: Secondary | ICD-10-CM | POA: Diagnosis not present

## 2023-10-17 DIAGNOSIS — I4891 Unspecified atrial fibrillation: Secondary | ICD-10-CM

## 2023-10-19 DIAGNOSIS — R0902 Hypoxemia: Secondary | ICD-10-CM | POA: Diagnosis not present

## 2023-10-20 ENCOUNTER — Telehealth: Payer: Self-pay | Admitting: Cardiology

## 2023-10-20 DIAGNOSIS — J9601 Acute respiratory failure with hypoxia: Secondary | ICD-10-CM | POA: Diagnosis not present

## 2023-10-20 DIAGNOSIS — J189 Pneumonia, unspecified organism: Secondary | ICD-10-CM | POA: Diagnosis not present

## 2023-10-20 DIAGNOSIS — A419 Sepsis, unspecified organism: Secondary | ICD-10-CM | POA: Diagnosis not present

## 2023-10-20 DIAGNOSIS — G9341 Metabolic encephalopathy: Secondary | ICD-10-CM | POA: Diagnosis not present

## 2023-10-20 NOTE — Telephone Encounter (Signed)
Daughter Zella Ball) called to report patient has been admitted to hospital with pneumonia and sepsis.  Daughter stated procedure scheduled for Thursday (12/26) will need to be postponed.  Daughter wants a call back to discuss next steps.

## 2023-10-21 DIAGNOSIS — A419 Sepsis, unspecified organism: Secondary | ICD-10-CM | POA: Diagnosis not present

## 2023-10-21 DIAGNOSIS — J189 Pneumonia, unspecified organism: Secondary | ICD-10-CM | POA: Diagnosis not present

## 2023-10-21 DIAGNOSIS — G9341 Metabolic encephalopathy: Secondary | ICD-10-CM | POA: Diagnosis not present

## 2023-10-21 DIAGNOSIS — J9601 Acute respiratory failure with hypoxia: Secondary | ICD-10-CM | POA: Diagnosis not present

## 2023-10-21 NOTE — Telephone Encounter (Signed)
Spoke with patients daughter Zella Ball, [place] on file) patient is still in the hospital and will contact us back at a later time to schedule pacemaker implant. Zella Ball made aware we cannot reschedule now due to patient current condition, thankful for the call back and will contact our office once patient discharged/transferred. No further needs at this time

## 2023-10-22 DIAGNOSIS — J189 Pneumonia, unspecified organism: Secondary | ICD-10-CM | POA: Diagnosis not present

## 2023-10-22 DIAGNOSIS — G9341 Metabolic encephalopathy: Secondary | ICD-10-CM | POA: Diagnosis not present

## 2023-10-22 DIAGNOSIS — J9601 Acute respiratory failure with hypoxia: Secondary | ICD-10-CM | POA: Diagnosis not present

## 2023-10-22 DIAGNOSIS — A419 Sepsis, unspecified organism: Secondary | ICD-10-CM | POA: Diagnosis not present

## 2023-10-23 ENCOUNTER — Encounter (HOSPITAL_COMMUNITY): Admission: RE | Payer: Self-pay | Source: Home / Self Care

## 2023-10-23 ENCOUNTER — Ambulatory Visit (HOSPITAL_COMMUNITY): Admission: RE | Admit: 2023-10-23 | Payer: Medicare HMO | Source: Home / Self Care | Admitting: Cardiology

## 2023-10-23 DIAGNOSIS — J189 Pneumonia, unspecified organism: Secondary | ICD-10-CM | POA: Diagnosis not present

## 2023-10-23 DIAGNOSIS — G9341 Metabolic encephalopathy: Secondary | ICD-10-CM | POA: Diagnosis not present

## 2023-10-23 DIAGNOSIS — A419 Sepsis, unspecified organism: Secondary | ICD-10-CM | POA: Diagnosis not present

## 2023-10-23 DIAGNOSIS — J9601 Acute respiratory failure with hypoxia: Secondary | ICD-10-CM | POA: Diagnosis not present

## 2023-10-23 SURGERY — PACEMAKER IMPLANT

## 2023-10-24 DIAGNOSIS — J90 Pleural effusion, not elsewhere classified: Secondary | ICD-10-CM | POA: Diagnosis not present

## 2023-10-24 DIAGNOSIS — J9601 Acute respiratory failure with hypoxia: Secondary | ICD-10-CM | POA: Diagnosis not present

## 2023-10-24 DIAGNOSIS — J189 Pneumonia, unspecified organism: Secondary | ICD-10-CM | POA: Diagnosis not present

## 2023-10-24 DIAGNOSIS — A419 Sepsis, unspecified organism: Secondary | ICD-10-CM | POA: Diagnosis not present

## 2023-10-24 DIAGNOSIS — G9341 Metabolic encephalopathy: Secondary | ICD-10-CM | POA: Diagnosis not present

## 2023-10-25 DIAGNOSIS — J189 Pneumonia, unspecified organism: Secondary | ICD-10-CM | POA: Diagnosis not present

## 2023-10-25 DIAGNOSIS — J9601 Acute respiratory failure with hypoxia: Secondary | ICD-10-CM | POA: Diagnosis not present

## 2023-10-25 DIAGNOSIS — G9341 Metabolic encephalopathy: Secondary | ICD-10-CM | POA: Diagnosis not present

## 2023-10-25 DIAGNOSIS — A419 Sepsis, unspecified organism: Secondary | ICD-10-CM | POA: Diagnosis not present

## 2023-10-26 DIAGNOSIS — A419 Sepsis, unspecified organism: Secondary | ICD-10-CM | POA: Diagnosis not present

## 2023-10-26 DIAGNOSIS — G9341 Metabolic encephalopathy: Secondary | ICD-10-CM | POA: Diagnosis not present

## 2023-10-26 DIAGNOSIS — J9601 Acute respiratory failure with hypoxia: Secondary | ICD-10-CM | POA: Diagnosis not present

## 2023-10-26 DIAGNOSIS — J189 Pneumonia, unspecified organism: Secondary | ICD-10-CM | POA: Diagnosis not present

## 2023-10-27 DIAGNOSIS — G9341 Metabolic encephalopathy: Secondary | ICD-10-CM | POA: Diagnosis not present

## 2023-10-27 DIAGNOSIS — A419 Sepsis, unspecified organism: Secondary | ICD-10-CM | POA: Diagnosis not present

## 2023-10-27 DIAGNOSIS — J189 Pneumonia, unspecified organism: Secondary | ICD-10-CM | POA: Diagnosis not present

## 2023-10-27 DIAGNOSIS — J9601 Acute respiratory failure with hypoxia: Secondary | ICD-10-CM | POA: Diagnosis not present

## 2023-10-28 DIAGNOSIS — I509 Heart failure, unspecified: Secondary | ICD-10-CM | POA: Diagnosis not present

## 2023-10-28 DIAGNOSIS — J189 Pneumonia, unspecified organism: Secondary | ICD-10-CM | POA: Diagnosis not present

## 2023-10-28 DIAGNOSIS — G9341 Metabolic encephalopathy: Secondary | ICD-10-CM | POA: Diagnosis not present

## 2023-11-04 ENCOUNTER — Encounter: Payer: Self-pay | Admitting: Internal Medicine

## 2023-11-06 ENCOUNTER — Ambulatory Visit: Payer: Medicare Other

## 2023-11-24 ENCOUNTER — Ambulatory Visit: Payer: Medicare HMO | Admitting: Cardiology

## 2023-11-29 DEATH — deceased

## 2023-12-01 ENCOUNTER — Ambulatory Visit: Payer: Medicare HMO | Admitting: Cardiology
